# Patient Record
Sex: Female | Born: 1970 | Race: White | Hispanic: No | Marital: Married | Smoking: Never smoker
Health system: Southern US, Community
[De-identification: ages and names within clinical notes are randomized; demographics above are authoritative.]

## PROBLEM LIST (undated history)

## (undated) DIAGNOSIS — M1120 Other chondrocalcinosis, unspecified site: Secondary | ICD-10-CM

## (undated) DIAGNOSIS — K219 Gastro-esophageal reflux disease without esophagitis: Secondary | ICD-10-CM

## (undated) DIAGNOSIS — M069 Rheumatoid arthritis, unspecified: Secondary | ICD-10-CM

## (undated) DIAGNOSIS — J40 Bronchitis, not specified as acute or chronic: Secondary | ICD-10-CM

## (undated) DIAGNOSIS — E079 Disorder of thyroid, unspecified: Secondary | ICD-10-CM

## (undated) HISTORY — PX: ESSURE TUBAL LIGATION: SUR464

## (undated) HISTORY — DX: Gastro-esophageal reflux disease without esophagitis: K21.9

## (undated) HISTORY — PX: OTHER SURGICAL HISTORY: SHX169

## (undated) HISTORY — PX: ABDOMINAL SURGERY: SHX537

## (undated) HISTORY — DX: Other chondrocalcinosis, unspecified site: M11.20

## (undated) HISTORY — DX: Rheumatoid arthritis, unspecified: M06.9

## (undated) HISTORY — DX: Disorder of thyroid, unspecified: E07.9

---

## 2009-03-31 ENCOUNTER — Ambulatory Visit: Payer: Self-pay | Admitting: Family Medicine

## 2009-03-31 DIAGNOSIS — E039 Hypothyroidism, unspecified: Secondary | ICD-10-CM | POA: Insufficient documentation

## 2009-03-31 DIAGNOSIS — M25569 Pain in unspecified knee: Secondary | ICD-10-CM | POA: Insufficient documentation

## 2009-04-30 ENCOUNTER — Encounter: Payer: Self-pay | Admitting: Family Medicine

## 2009-05-26 ENCOUNTER — Ambulatory Visit: Payer: Self-pay | Admitting: Family Medicine

## 2009-05-26 DIAGNOSIS — M069 Rheumatoid arthritis, unspecified: Secondary | ICD-10-CM | POA: Insufficient documentation

## 2009-05-26 DIAGNOSIS — R7989 Other specified abnormal findings of blood chemistry: Secondary | ICD-10-CM | POA: Insufficient documentation

## 2009-05-26 DIAGNOSIS — M059 Rheumatoid arthritis with rheumatoid factor, unspecified: Secondary | ICD-10-CM | POA: Insufficient documentation

## 2009-05-26 DIAGNOSIS — R030 Elevated blood-pressure reading, without diagnosis of hypertension: Secondary | ICD-10-CM | POA: Insufficient documentation

## 2009-05-28 ENCOUNTER — Telehealth: Payer: Self-pay | Admitting: Family Medicine

## 2009-06-06 ENCOUNTER — Telehealth: Payer: Self-pay | Admitting: Family Medicine

## 2009-06-16 ENCOUNTER — Ambulatory Visit: Payer: Self-pay | Admitting: Family Medicine

## 2009-06-16 LAB — CONVERTED CEMR LAB: Blood Glucose, AC Bkfst: 88 mg/dL

## 2009-07-10 ENCOUNTER — Telehealth: Payer: Self-pay | Admitting: Family Medicine

## 2009-10-21 ENCOUNTER — Ambulatory Visit: Payer: Self-pay | Admitting: Family Medicine

## 2009-10-21 ENCOUNTER — Encounter (INDEPENDENT_AMBULATORY_CARE_PROVIDER_SITE_OTHER): Payer: Self-pay | Admitting: *Deleted

## 2009-10-21 ENCOUNTER — Encounter: Admission: RE | Admit: 2009-10-21 | Discharge: 2009-10-21 | Payer: Self-pay | Admitting: Family Medicine

## 2009-10-22 ENCOUNTER — Encounter: Payer: Self-pay | Admitting: Family Medicine

## 2009-10-22 LAB — CONVERTED CEMR LAB: TSH: 1.105 microintl units/mL (ref 0.350–4.500)

## 2009-10-29 ENCOUNTER — Encounter: Payer: Self-pay | Admitting: Family Medicine

## 2009-12-24 ENCOUNTER — Encounter: Payer: Self-pay | Admitting: Family Medicine

## 2010-02-18 ENCOUNTER — Ambulatory Visit: Payer: Self-pay | Admitting: Family Medicine

## 2010-03-19 ENCOUNTER — Encounter: Payer: Self-pay | Admitting: Family Medicine

## 2010-04-23 ENCOUNTER — Encounter: Payer: Self-pay | Admitting: Family Medicine

## 2010-06-26 ENCOUNTER — Ambulatory Visit: Payer: Self-pay | Admitting: Family Medicine

## 2010-06-26 DIAGNOSIS — F330 Major depressive disorder, recurrent, mild: Secondary | ICD-10-CM | POA: Insufficient documentation

## 2010-06-26 DIAGNOSIS — F329 Major depressive disorder, single episode, unspecified: Secondary | ICD-10-CM

## 2010-06-26 DIAGNOSIS — F32A Depression, unspecified: Secondary | ICD-10-CM | POA: Insufficient documentation

## 2010-08-24 ENCOUNTER — Encounter: Payer: Self-pay | Admitting: Family Medicine

## 2010-08-25 ENCOUNTER — Ambulatory Visit: Payer: Self-pay | Admitting: Family Medicine

## 2010-08-25 DIAGNOSIS — J069 Acute upper respiratory infection, unspecified: Secondary | ICD-10-CM | POA: Insufficient documentation

## 2010-09-02 ENCOUNTER — Ambulatory Visit: Payer: Self-pay | Admitting: Family Medicine

## 2010-09-02 ENCOUNTER — Encounter
Admission: RE | Admit: 2010-09-02 | Discharge: 2010-09-02 | Payer: Self-pay | Source: Home / Self Care | Admitting: Family Medicine

## 2010-09-02 DIAGNOSIS — R05 Cough: Secondary | ICD-10-CM

## 2010-09-02 DIAGNOSIS — J189 Pneumonia, unspecified organism: Secondary | ICD-10-CM | POA: Insufficient documentation

## 2010-09-02 DIAGNOSIS — R059 Cough, unspecified: Secondary | ICD-10-CM | POA: Insufficient documentation

## 2010-09-02 DIAGNOSIS — R0902 Hypoxemia: Secondary | ICD-10-CM | POA: Insufficient documentation

## 2010-09-02 DIAGNOSIS — R062 Wheezing: Secondary | ICD-10-CM | POA: Insufficient documentation

## 2010-09-04 ENCOUNTER — Ambulatory Visit: Payer: Self-pay | Admitting: Family Medicine

## 2010-09-07 ENCOUNTER — Ambulatory Visit: Payer: Self-pay | Admitting: Family Medicine

## 2010-09-08 ENCOUNTER — Ambulatory Visit: Payer: Self-pay | Admitting: Family Medicine

## 2010-09-16 ENCOUNTER — Telehealth: Payer: Self-pay | Admitting: Family Medicine

## 2010-09-22 ENCOUNTER — Encounter: Payer: Self-pay | Admitting: Family Medicine

## 2010-10-08 ENCOUNTER — Ambulatory Visit
Admission: RE | Admit: 2010-10-08 | Discharge: 2010-10-08 | Payer: Self-pay | Source: Home / Self Care | Attending: Family Medicine | Admitting: Family Medicine

## 2010-10-08 DIAGNOSIS — J309 Allergic rhinitis, unspecified: Secondary | ICD-10-CM | POA: Insufficient documentation

## 2010-10-13 ENCOUNTER — Encounter: Payer: Self-pay | Admitting: Family Medicine

## 2010-10-14 LAB — CONVERTED CEMR LAB
ALT: 16 units/L (ref 0–35)
AST: 15 units/L (ref 0–37)
Albumin: 4.3 g/dL (ref 3.5–5.2)
Calcium: 9.1 mg/dL (ref 8.4–10.5)
Cholesterol: 188 mg/dL (ref 0–200)
Creatinine, Ser: 0.76 mg/dL (ref 0.40–1.20)
HCT: 43.8 % (ref 36.0–46.0)
LDL Cholesterol: 129 mg/dL — ABNORMAL HIGH (ref 0–99)
MCHC: 32.4 g/dL (ref 30.0–36.0)
MCV: 96.7 fL (ref 78.0–100.0)
RDW: 13.5 % (ref 11.5–15.5)
Sodium: 141 meq/L (ref 135–145)
Total Bilirubin: 0.6 mg/dL (ref 0.3–1.2)
Total CHOL/HDL Ratio: 4.5
Total Protein: 7 g/dL (ref 6.0–8.3)

## 2010-10-27 ENCOUNTER — Telehealth: Payer: Self-pay | Admitting: Family Medicine

## 2010-10-27 NOTE — Letter (Signed)
Summary: Depression Questionnaire  Depression Questionnaire   Imported By: Lanelle Bal 07/10/2010 10:37:19  _____________________________________________________________________  External Attachment:    Type:   Image     Comment:   External Document

## 2010-10-27 NOTE — Letter (Signed)
Summary: [facility]   Imported By: Maryln Gottron 09/04/2010 12:12:24  _____________________________________________________________________  External Attachment:    Type:   Image     Comment:   External Document

## 2010-10-27 NOTE — Letter (Signed)
Summary: Patient Health Questionnaire  Patient Health Questionnaire   Imported By: Maryln Gottron 09/04/2010 13:51:16  _____________________________________________________________________  External Attachment:    Type:   Image     Comment:   External Document

## 2010-10-27 NOTE — Letter (Signed)
Summary: [facility]   Imported By: Lanelle Bal 05/12/2010 14:08:20  _____________________________________________________________________  External Attachment:    Type:   Image     Comment:   External Document

## 2010-10-27 NOTE — Assessment & Plan Note (Signed)
Summary: depression   Vital Signs:  Patient profile:   40 year old female Height:      66 inches Weight:      197 pounds BMI:     31.91 O2 Sat:      98 % on Room air Pulse rate:   85 / minute BP sitting:   129 / 83  (right arm) Cuff size:   regular  Vitals Entered By: Payton Spark CMA (June 26, 2010 11:26 AM)  O2 Flow:  Room air CC: Depression x few months. Family has noticed changes in mood.   Primary Care Leverne Amrhein:  Seymour Bars DO  CC:  Depression x few months. Family has noticed changes in mood.Nancy Wang  History of Present Illness: 40 yo WF presents for depression.  This is new.  Her family confronted her about her mood.  She has been through a lot after the birth of her son 52 mos ago with dx RA pain and limitations.  She feels like her pain has improved some.    She has not needed to take anything for mood in the past.  Denies any suicidal thoughts.  Has poor sleep at night and is always tired and irritable.    Current Medications (verified): 1)  Levothyroxine Sodium 100 Mcg Tabs (Levothyroxine Sodium) .Nancy Wang.. 1 Tab By Mouth Daily 2)  Omeprazole 20 Mg Cpdr (Omeprazole) .Nancy Wang.. 1 Tab By Mouth Once Daily 3)  Ortho Tri-Cyclen Lo 0.18/0.215/0.25 Mg-25 Mcg Tabs (Norgestim-Eth Estrad Triphasic) .Nancy Wang.. 1 Tab By Mouth Once Daily 4)  Hydrocodone-Acetaminophen 5-500 Mg Tabs (Hydrocodone-Acetaminophen) .... As Directed 5)  Metrogel 1 % Gel (Metronidazole) .... Use As Directed By Derm 6)  Folic Acid 800 Mcg Tabs (Folic Acid) .... Daily 7)  Methotrexate 0.7mg  Inj .... Use As Directed Once Weekly  Allergies (verified): 1)  ! Morphine Sulfate (Morphine Sulfate)  Past History:  Past Medical History: Reviewed history from 02/18/2010 and no changes required. Hypothyroidism GERD G2P1011-- [facility] Care Pseudogout/ RA - Dr Dareen Piano  Past Surgical History: Exploratory laparoscopy 1985, 2004 for ovarian cysts, infertility Arthroscopic surgery both knees R 1985 (removed patella), L on  1988 Essure done   Family History: Mother HTN DM Father deceased CAD Prostate CA, ? depression Brother healthy Family History Diabetes 1st degree relative Family History Hypertension  Social History: Reviewed history from 05/26/2009 and no changes required. Married to [employer] Never Smoked Alcohol use-no  Drug use-no Regular exercise-no, limited by knee pain. 89 pre- pregnancy wt. Has a 65 mos old son  Review of Systems Psych:  Complains of depression, easily tearful, and irritability; denies anxiety, easily angered, panic attacks, suicidal thoughts/plans, thoughts of violence, and unusual visions or sounds.  Physical Exam  General:  alert, well-developed, well-nourished, well-hydrated, and overweight-appearing.   Head:  normocephalic and atraumatic.   Neck:  no masses.   Lungs:  Normal respiratory effort, chest expands symmetrically. Lungs are clear to auscultation, no crackles or wheezes. Heart:  normal rate, regular rhythm, and no murmur.   Skin:  color normal.   Psych:  good eye contact and depressed affect.     Impression & Recommendations:  Problem # 1:  DEPRESSION (ICD-311) Assessment New PHQ-9 score of 21.  Likely secondary to failry new dx of RA, chronic pain and limitations. She is on Essure for birth control.  Will start her on Citalopram 10 mg/ day and RTC in 6 wks to repeat PHQ-9.  Has a good support system.  Consider adding counseling.  Call if any  problems including suicidal ideations/ worsening mood.     Her updated medication list for this problem includes:    Citalopram Hydrobromide 10 Mg Tabs (Citalopram hydrobromide) .Nancy Wang... 1 tab by mouth qpm  Complete Medication List: 1)  Levothyroxine Sodium 100 Mcg Tabs (Levothyroxine sodium) .Nancy Wang.. 1 tab by mouth daily 2)  Omeprazole 20 Mg Cpdr (Omeprazole) .Nancy Wang.. 1 tab by mouth once daily 3)  Ortho Tri-cyclen Lo 0.18/0.215/0.25 Mg-25 Mcg Tabs (Norgestim-eth estrad triphasic) .Nancy Wang.. 1 tab by mouth once daily 4)   Hydrocodone-acetaminophen 5-500 Mg Tabs (Hydrocodone-acetaminophen) .... As directed 5)  Metrogel 1 % Gel (Metronidazole) .... Use as directed by derm 6)  Folic Acid 800 Mcg Tabs (Folic acid) .... Daily 7)  Methotrexate 0.7mg  Inj  .... Use as directed once weekly 8)  Citalopram Hydrobromide 10 Mg Tabs (Citalopram hydrobromide) .Nancy Wang.. 1 tab by mouth qpm  Patient Instructions: 1)  Start on Citalopram 10 mg in the evenings for depression. 2)  Call if any problems. 3)  Return for f/u in 6 wks.   Prescriptions: CITALOPRAM HYDROBROMIDE 10 MG TABS (CITALOPRAM HYDROBROMIDE) 1 tab by mouth qPM  #30 x 1   Entered and Authorized by:   Seymour Bars DO   Signed by:   Seymour Bars DO on 06/26/2010   Method used:   Electronically to        [phone]* (retail)       [address]       [place], [place]  [address]       Ph: [phone]       Fax: [phone]   RxID:   [id number]

## 2010-10-27 NOTE — Assessment & Plan Note (Signed)
Summary: Check O2//mpm  Nurse Visit   Vital Signs:  Patient profile:   40 year old female O2 Sat:      96 % on Room air Temp:     98.2 degrees F oral  Vitals Entered By: Payton Spark CMA (September 04, 2010 11:28 AM)  O2 Flow:  Room air CC: Pt states she will continue current medications before going to ED. I strongly advised Pt to go to ED if SOB becomes worse. Pt agreed. Pt will also call on Monday to report progress.    Allergies: 1)  ! Morphine Sulfate (Morphine Sulfate)  Orders Added: 1)  Est. Patient Level I [38756]    Impression & Recommendations:  Problem # 1:  PNEUMONIA, ATYPICAL (ICD-486) Pt is already on treatment for atypical PNA but her CXR was normal.  She is abx and steroids with RX cough syrup.  Denies SOB.  Still has cough and congestion but meds are helping.  Pulse ox improved to 96%.  Pt was instructed that if breathing got worse, to go to the ED and she agrees.  Continue current treatment plan.   Her updated medication list for this problem includes:    Zithromax Z-pak 250 Mg Tabs (Azithromycin) .Marland Kitchen... Take as directed  Complete Medication List: 1)  Levothyroxine Sodium 100 Mcg Tabs (Levothyroxine sodium) .Marland Kitchen.. 1 tab by mouth daily 2)  Omeprazole 20 Mg Cpdr (Omeprazole) .Marland Kitchen.. 1 tab by mouth once daily 3)  Ortho Tri-cyclen Lo 0.18/0.215/0.25 Mg-25 Mcg Tabs (Norgestim-eth estrad triphasic) .Marland Kitchen.. 1 tab by mouth once daily 4)  Hydrocodone-acetaminophen 5-500 Mg Tabs (Hydrocodone-acetaminophen) .... As directed 5)  Metrogel 1 % Gel (Metronidazole) .... Use as directed by derm 6)  Folic Acid 800 Mcg Tabs (Folic acid) .... Daily 7)  Methotrexate 1 Mg  .... Use as directed once weekly 8)  Citalopram Hydrobromide 20 Mg Tabs (Citalopram hydrobromide) .Marland Kitchen.. 1 tab by mouth daily 9)  Promethazine-codeine 6.25-10 Mg/74ml Syrp (Promethazine-codeine) .... 5-10 ml by mouth q 6 hrs as needed cough 10)  Zithromax Z-pak 250 Mg Tabs (Azithromycin) .... Take as directed

## 2010-10-27 NOTE — Assessment & Plan Note (Signed)
Summary: URI/ mood   Vital Signs:  Patient profile:   40 year old female Height:      66 inches Weight:      189 pounds BMI:     30.62 O2 Sat:      96 % on Room air Temp:     99.0 degrees F oral Pulse rate:   104 / minute BP sitting:   122 / 79  (left arm) Cuff size:   regular  Vitals Entered By: Payton Spark CMA (August 25, 2010 11:15 AM)  O2 Flow:  Room air CC: f/u mood and also c/o cough.   Primary Care Provider:  Seymour Bars DO  CC:  f/u mood and also c/o cough..  History of Present Illness: 40 yo WF presents for f/u depression.  She started on Citalopram 8 wks ago.  She has noticed some large improvements.    Started with sore throat and runny nose with low grade fevers 5 days ago followed by a dry hacking cough..  She took some Nyquil which helped some.  Her son has had a cold.  She is not taking anything during the day.  Denies sputum production, CP or SOB.  No hx of asthma.  Non smoker.  On MTX for RA.    Current Medications (verified): 1)  Levothyroxine Sodium 100 Mcg Tabs (Levothyroxine Sodium) .Marland Kitchen.. 1 Tab By Mouth Daily 2)  Omeprazole 20 Mg Cpdr (Omeprazole) .Marland Kitchen.. 1 Tab By Mouth Once Daily 3)  Ortho Tri-Cyclen Lo 0.18/0.215/0.25 Mg-25 Mcg Tabs (Norgestim-Eth Estrad Triphasic) .Marland Kitchen.. 1 Tab By Mouth Once Daily 4)  Hydrocodone-Acetaminophen 5-500 Mg Tabs (Hydrocodone-Acetaminophen) .... As Directed 5)  Metrogel 1 % Gel (Metronidazole) .... Use As Directed By Derm 6)  Folic Acid 800 Mcg Tabs (Folic Acid) .... Daily 7)  Methotrexate 0.7mg  Inj .... Use As Directed Once Weekly 8)  Citalopram Hydrobromide 10 Mg Tabs (Citalopram Hydrobromide) .Marland Kitchen.. 1 Tab By Mouth Qpm  Allergies (verified): 1)  ! Morphine Sulfate (Morphine Sulfate)  Past History:  Past Medical History: Reviewed history from 02/18/2010 and no changes required. Hypothyroidism GERD G2P1011-- [facility] Care Pseudogout/ RA - Dr Dareen Piano  Social History: Reviewed history from 05/26/2009 and no changes  required. Married to [employer] Never Smoked Alcohol use-no  Drug use-no Regular exercise-no, limited by knee pain. 89 pre- pregnancy wt. Has toddler son  Review of Systems      See HPI  Physical Exam  General:  alert, well-developed, well-nourished, well-hydrated, and overweight-appearing.   Head:  normocephalic and atraumatic.  sinuses NTTP Eyes:  conjunctiva clear Ears:  EACs patent; TMs translucent and gray with good cone of light and bony landmarks.  Nose:  copious nasal congestion with clear rhinorrhea Mouth:  o/p mildly injected; no exudates or vesicles Neck:  no masses.   Lungs:  Normal respiratory effort, chest expands symmetrically. Lungs are clear to auscultation, no crackles or wheezes. dry cough Heart:  Normal rate and regular rhythm. S1 and S2 normal without gallop, murmur, click, rub or other extra sounds. Skin:  color normal and no rashes.   Cervical Nodes:  shotty anterior cervical chain LA   Impression & Recommendations:  Problem # 1:  VIRAL URI (ICD-465.9) Day 5 viral URI.  Will treat with supportive care measures as listed under pt instructions.  Call if not improved after 7 days.  Problem # 2:  DEPRESSION (ICD-311) PHQ 9 score 7, much improved.  Will increase her dose from 10--> 20 mg once daily.  Call if  any problems.  REturn for f/u in 3 mos. Her updated medication list for this problem includes:    Citalopram Hydrobromide 20 Mg Tabs (Citalopram hydrobromide) .Marland Kitchen... 1 tab by mouth daily  Complete Medication List: 1)  Levothyroxine Sodium 100 Mcg Tabs (Levothyroxine sodium) .Marland Kitchen.. 1 tab by mouth daily 2)  Omeprazole 20 Mg Cpdr (Omeprazole) .Marland Kitchen.. 1 tab by mouth once daily 3)  Ortho Tri-cyclen Lo 0.18/0.215/0.25 Mg-25 Mcg Tabs (Norgestim-eth estrad triphasic) .Marland Kitchen.. 1 tab by mouth once daily 4)  Hydrocodone-acetaminophen 5-500 Mg Tabs (Hydrocodone-acetaminophen) .... As directed 5)  Metrogel 1 % Gel (Metronidazole) .... Use as directed by derm 6)  Folic Acid 800  Mcg Tabs (Folic acid) .... Daily 7)  Methotrexate 1 Mg  .... Use as directed once weekly 8)  Citalopram Hydrobromide 20 Mg Tabs (Citalopram hydrobromide) .Marland Kitchen.. 1 tab by mouth daily  Patient Instructions: 1)  Increase Citalopram to 20 mg once day. 2)  Use Advil Cold and Flu during the day and Nyquil at night as needed this week.  REst, clear fluids and call if not improved or getting worse next wk. 3)  If OK, come in for flu shot next wk.   Prescriptions: CITALOPRAM HYDROBROMIDE 20 MG TABS (CITALOPRAM HYDROBROMIDE) 1 tab by mouth daily  #30 x 3   Entered and Authorized by:   Seymour Bars DO   Signed by:   Seymour Bars DO on 08/25/2010   Method used:   Electronically to        [facility] [phone]* (retail)       [address] [place], [place]  [address]       Ph: [phone]       Fax: [phone]   RxID:   [id number]    Orders Added: 1)  Est. Patient Level III [36644]

## 2010-10-27 NOTE — Assessment & Plan Note (Signed)
Summary: f/u RA/ thyroid   Vital Signs:  Patient profile:   40 year old female Height:      66 inches Weight:      200 pounds BMI:     32.40 O2 Sat:      98 % on Room air Temp:     99.9 degrees F oral Pulse rate:   108 / minute BP sitting:   127 / 75  (left arm) Cuff size:   large  Vitals Entered By: Payton Spark CMA (October 21, 2009 9:59 AM)  O2 Flow:  Room air CC: Discuss RA. Throat sore to touch and sometimes hard to swallow. x 2 months.    Primary Care Provider:  Seymour Bars DO  CC:  Discuss RA. Throat sore to touch and sometimes hard to swallow. x 2 months. .  History of Present Illness: 40 yo WF presents for a recurring sensation of throat tendenerss from the outside but no pain with swallowing.  Denies feeling like she cannot breathe.  She is able to swallow food and drink.  Denies having dry mouth.   Denies having heartburn symptoms.  Denies voice hoarseness , neck swelling or new meds.    She has been on Prednisone for her RA since July and has had wt gain.  She has been out of work due to her RA.  She is on MTX daily.  She is interested in a 2nd opinion in her RA treatment.      Current Medications (verified): 1)  Pre-Natal  Tabs (Prenatal Multivit-Min-Fe-Fa) 2)  Levothyroxine Sodium 100 Mcg Tabs (Levothyroxine Sodium) .Marland Kitchen.. 1 Tab By Mouth Daily 3)  Omeprazole 20 Mg Cpdr (Omeprazole) .Marland Kitchen.. 1 Tab By Mouth Once Daily 4)  Ortho Tri-Cyclen Lo 0.18/0.215/0.25 Mg-25 Mcg Tabs (Norgestim-Eth Estrad Triphasic) .Marland Kitchen.. 1 Tab By Mouth Once Daily 5)  Hydrocodone-Acetaminophen 5-500 Mg Tabs (Hydrocodone-Acetaminophen) .... As Directed 6)  Methotrexate .... Take 7 Tabs Every Wed As Directed  Allergies (verified): 1)  ! Morphine Sulfate (Morphine Sulfate)  Past History:  Past Medical History: Reviewed history from 05/26/2009 and no changes required. Hypothyroidism GERD G2P1011-- [facility] Care Pseudogout/ RA - Dr Virgel Manifold.  Social History: Reviewed history from  05/26/2009 and no changes required. Married to [employer] Never Smoked Alcohol use-no  Drug use-no Regular exercise-no, limited by knee pain. 89 pre- pregnancy wt. Has a 38 mos old son  Review of Systems      See HPI  Physical Exam  General:  obese WF in NAD Head:  normocephalic and atraumatic.   Eyes:  conjunctiva clear Ears:  no external deformities.   Nose:  no nasal discharge.   Mouth:  pharynx pink and moist.  swallows w/o difficulty Neck:  supple and no masses.  no anterior neck tenderness.  no thyromegaly, thyroid bruits or masses palpable. Lungs:  Normal respiratory effort, chest expands symmetrically. Lungs are clear to auscultation, no crackles or wheezes. Heart:  Normal rate and regular rhythm. S1 and S2 normal without gallop, murmur, click, rub or other extra sounds. Extremities:  1+ nonpitting LE edema bilat Skin:  color normal.   Cervical Nodes:  No lymphadenopathy noted Psych:  flat affect.     Impression & Recommendations:  Problem # 1:  HYPOTHYROIDISM (ICD-244.9) Due for TSH today and will add a thyroid u/s for new onset anterior neck tenderness.  Labs should indicate any kind of thyroiditis which would need a nuclear test.  Also in the DDX fo anterior neck pain is acid reflux  or angioedema.   Her updated medication list for this problem includes:    Levothyroxine Sodium 100 Mcg Tabs (Levothyroxine sodium) .Marland Kitchen... 1 tab by mouth daily  Orders: T-*Unlisted Diagnostic X-ray test/procedure (04540) T-TSH [phone])  Problem # 2:  RHEUMATOID ARTHRITIS (ICD-714.0) She has been seeing Dr Virgel Manifold for the past year, on MTX but still having daily disabling symptoms.  Will get her in with Dr Dareen Piano in [facility] since she wants to see if something more aggressive can be done.   The following medications were removed from the medication list:    Prednisone 10 Mg Tabs (Prednisone) .Marland Kitchen... 1 tab by mouth once daily  Orders: Rheumatology Referral (Rheumatology)  Complete  Medication List: 1)  Pre-natal Tabs (Prenatal multivit-min-fe-fa) 2)  Levothyroxine Sodium 100 Mcg Tabs (Levothyroxine sodium) .Marland Kitchen.. 1 tab by mouth daily 3)  Omeprazole 20 Mg Cpdr (Omeprazole) .Marland Kitchen.. 1 tab by mouth once daily 4)  Ortho Tri-cyclen Lo 0.18/0.215/0.25 Mg-25 Mcg Tabs (Norgestim-eth estrad triphasic) .Marland Kitchen.. 1 tab by mouth once daily 5)  Hydrocodone-acetaminophen 5-500 Mg Tabs (Hydrocodone-acetaminophen) .... As directed 6)  Methotrexate 2. 5mg   .... Take 7 tabs every wed as directed 7)  Folic Acid 800 Mcg Tabs (Folic acid) .... Daily  Other Orders: T-LDL Direct ([phone])  Patient Instructions: 1)  Labs today. 2)  Thyroid u/s today. 3)  Will call you w/ results tomorrow. 4)  REferral placed to Dr Dareen Piano in [facility] for RA treatment. 5)  Return for f/u in 4 mos.

## 2010-10-27 NOTE — Letter (Signed)
Summary: Primary Care Consult Scheduled Letter  [place] [facility]  [address]   [place], [place] [address]   Phone: [phone]  Fax: [phone]      10/21/2009 MRN: [id number]  [facility] [address] [place], [place]  [address]    Dear Ms. [facility],      We have scheduled an appointment for you.  At the recommendation of DR Cathey Endow, we have scheduled you a consult with [facility] ,DR Dareen Piano ,RHEUMATOLOGIST  on [facility] 2,2011 at 10AM.  Their address is [address],[place]. The office phone number is [phone].  If this appointment day and time is not convenient for you, please feel free to call the office of the doctor you are being referred to at the number listed above and reschedule the appointment.     It is important for you to keep your scheduled appointments. We are here to make sure you are given good patient care. If you have questions or you have made changes to your appointment, please notify us at  [phone], ask for HELEN.    Thank you,  Patient Care Coordinator [place] [facility]

## 2010-10-27 NOTE — Assessment & Plan Note (Signed)
Summary: PNA   Vital Signs:  Patient profile:   40 year old female Height:      66 inches Weight:      183 pounds BMI:     29.64 O2 Sat:      93 % on Room air Temp:     98.7 degrees F oral Pulse rate:   96 / minute BP sitting:   116 / 75  (left arm) Cuff size:   large  Vitals Entered By: Payton Spark CMA (September 02, 2010 9:29 AM)  O2 Flow:  Room air CC: Cough much worse and back pain due to cough x weeks.    Primary Care Provider:  Seymour Bars DO  CC:  Cough much worse and back pain due to cough x weeks. Marland Kitchen  History of Present Illness: 40 yo WF presents for continued cough after she was seen for a URI about 10 days ago.  She is coughing to the point of post tussive emesis.  She has back pain from coughing.  Using Advil Cold daytime and nyquil at night.  Hx of asthma as a child.  Denies fevers but has chills and bodyaches.  she is on MTX and did not hold her dose last wk.  Was due for injection today.  She has chest tightness and DOE wtih malaise.  Denies abd pain or nausea.    Allergies: 1)  ! Morphine Sulfate (Morphine Sulfate)  Past History:  Past Medical History: Reviewed history from 02/18/2010 and no changes required. Hypothyroidism GERD G2P1011-- [facility] Care Pseudogout/ RA - Dr Dareen Piano  Past Surgical History: Reviewed history from 06/26/2010 and no changes required. Exploratory laparoscopy 1985, 2004 for ovarian cysts, infertility Arthroscopic surgery both knees R 1985 (removed patella), L on 1988 Essure done   Social History: Reviewed history from 08/25/2010 and no changes required. Married to [employer] Never Smoked Alcohol use-no  Drug use-no Regular exercise-no, limited by knee pain. 89 pre- pregnancy wt. Has toddler son  Review of Systems      See HPI  Physical Exam  General:  alert, well-developed, well-nourished, well-hydrated, and overweight-appearing.   Head:  normocephalic and atraumatic.  sinuses NTTP Eyes:  conjunctiva  clear Ears:  EACs patent; TMs translucent and gray with good cone of light and bony landmarks.  Nose:  no nasal discharge.   Mouth:  pharynx pink and moist.  o/p mildly injected Neck:  shotty anterior cervical chain LA Chest Wall:  no tenderness.   Lungs:  nonlabored breathing with continue dry hacking cough, no rhonchi but has scattered wheezing  Heart:  normal rate, regular rhythm, and no murmur.   Extremities:  no LE edema Skin:  color normal.  skin warm and dry   Impression & Recommendations:  Problem # 1:  PNEUMONIA, ATYPICAL (ICD-486)  Will treat for atypical PNA given worsening cough, hypoxemia, wheezing and SOB with malaise >10 days after onset URI with use of immunosuppressant meds.  CXR today given hypoxemia. Rocephin 1 gram IM + Zithromax x 5 days given.  Use RX cough syrup as needed.  Treat wheezing with Depo Medrol 80 mg IM x 1 + ProAir HFA 2 puffs 4 x a day.  REcheck in 6 days.  Advised if feeling worse with SOB, needed to go to the ED/ call 911.  Pt agrees to plan of care. Her updated medication list for this problem includes:    Zithromax Z-pak 250 Mg Tabs (Azithromycin) .Marland Kitchen... Take as directed  Orders: Rocephin  250mg  (  V7846) Admin of Therapeutic Inj  intramuscular or subcutaneous (96295)  Complete Medication List: 1)  Levothyroxine Sodium 100 Mcg Tabs (Levothyroxine sodium) .Marland Kitchen.. 1 tab by mouth daily 2)  Omeprazole 20 Mg Cpdr (Omeprazole) .Marland Kitchen.. 1 tab by mouth once daily 3)  Ortho Tri-cyclen Lo 0.18/0.215/0.25 Mg-25 Mcg Tabs (Norgestim-eth estrad triphasic) .Marland Kitchen.. 1 tab by mouth once daily 4)  Hydrocodone-acetaminophen 5-500 Mg Tabs (Hydrocodone-acetaminophen) .... As directed 5)  Metrogel 1 % Gel (Metronidazole) .... Use as directed by derm 6)  Folic Acid 800 Mcg Tabs (Folic acid) .... Daily 7)  Methotrexate 1 Mg  .... Use as directed once weekly 8)  Citalopram Hydrobromide 20 Mg Tabs (Citalopram hydrobromide) .Marland Kitchen.. 1 tab by mouth daily 9)  Promethazine-codeine 6.25-10  Mg/76ml Syrp (Promethazine-codeine) .... 5-10 ml by mouth q 6 hrs as needed cough 10)  Zithromax Z-pak 250 Mg Tabs (Azithromycin) .... Take as directed  Other Orders: T-Chest x-ray, 2 views (71020) Depo- Medrol 80mg  (J1040)  Patient Instructions: 1)  CXR today. 2)  Will call you w/ results this afternoon. 3)  Depo Medrol injection + Rocephin injections given today. 4)  Take 5 days of Zithromax. 5)  Use inhaler 2 puffs 4 x a day for the next week. 6)  Use RX cough syrup for cough (it is sedating) and use Advil for aches and pains. 7)  Return for follow up next Tuesday. Prescriptions: ZITHROMAX Z-PAK 250 MG TABS (AZITHROMYCIN) take as directed  #1 pack x 0   Entered and Authorized by:   Seymour Bars DO   Signed by:   Seymour Bars DO on 09/02/2010   Method used:   Electronically to        [phone]* (retail)       [address] [place], [place]  [address]       Ph: [phone]       Fax: [phone]   RxID:   [id number] PROMETHAZINE-CODEINE 6.25-10 MG/5ML SYRP (PROMETHAZINE-CODEINE) 5-10 ml by mouth q 6 hrs as needed cough  #200 ml x 0   Entered and Authorized by:   Seymour Bars DO   Signed by:   Seymour Bars DO on 09/02/2010   Method used:   Printed then faxed to ...       [facility] [phone]* (retail)       [address] [place], [place]  [address]       Ph: [phone]       Fax: [phone]   RxID:   [id number]    Medication Administration  Injection # 1:    Medication: Depo- Medrol 80mg     Diagnosis: HYPOXEMIA (ICD-799.02)    Route: IM    Site: RUOQ gluteus    Exp Date: 03/2011    Lot #: [id number]    Patient tolerated injection without complications    Given by: Payton Spark CMA (September 02, 2010 10:35 AM)  Injection # 2:    Medication: Rocephin  250mg     Diagnosis: HYPOXEMIA (ICD-799.02)    Route: IM    Site: LUOQ gluteus    Exp Date: 01/2013    Lot #: [id number]    Patient tolerated injection without complications     Given by: Payton Spark CMA (September 02, 2010 10:36 AM)  Orders Added: 1)  T-Chest x-ray, 2 views [71020] 2)  Depo- Medrol 80mg  [J1040] 3)  Rocephin  250mg  [J0696] 4)  Admin of Therapeutic Inj  intramuscular or subcutaneous [96372] 5)  Est. Patient Level III [56387]       Medication Administration  Injection # 1:    Medication: Depo- Medrol 80mg     Diagnosis: HYPOXEMIA (ICD-799.02)    Route: IM    Site: RUOQ gluteus    Exp Date: 03/2011    Lot #: [id number]    Patient tolerated injection without complications    Given by: Payton Spark CMA (September 02, 2010 10:35 AM)  Injection # 2:    Medication: Rocephin  250mg     Diagnosis: HYPOXEMIA (ICD-799.02)    Route: IM    Site: LUOQ gluteus    Exp Date: 01/2013    Lot #: [id number]    Patient tolerated injection without complications    Given by: Payton Spark CMA (September 02, 2010 10:36 AM)  Orders Added: 1)  T-Chest x-ray, 2 views [71020] 2)  Depo- Medrol 80mg  [J1040] 3)  Rocephin  250mg  [J0696] 4)  Admin of Therapeutic Inj  intramuscular or subcutaneous [96372] 5)  Est. Patient Level III [95188]   Appended Document: PNA append: instructed pt to NOT TAKE her Methotrexate today.  Will reasses before next wk's scheduled injection.  Seymour Bars, D.O.

## 2010-10-27 NOTE — Consult Note (Signed)
Summary: [facility]   Imported By: Lanelle Bal 04/01/2010 09:49:37  _____________________________________________________________________  External Attachment:    Type:   Image     Comment:   External Document

## 2010-10-27 NOTE — Consult Note (Signed)
Summary: [facility]   Imported By: Lanelle Bal 11/07/2009 11:30:13  _____________________________________________________________________  External Attachment:    Type:   Image     Comment:   External Document

## 2010-10-27 NOTE — Assessment & Plan Note (Signed)
Summary: f/u RA/ thyroid   Vital Signs:  Patient profile:   40 year old female Height:      66 inches Weight:      196 pounds Pulse rate:   85 / minute BP sitting:   110 / 65  (left arm) Cuff size:   regular  Vitals Entered By: Kathlene November (Feb 18, 2010 9:58 AM) CC: followup   Primary Care Provider:  Seymour Bars DO  CC:  followup.  History of Present Illness: 40 yo WF presents for f/u visit.  Her TSH and thyroid u/s were normal 4 mos ago.  She is now seeing Dr Dareen Piano is managing her RA and pseudogout.  She is on MTX and had an injection in her L ankle which really helped.  She is still having aches and pains in her joints.  She has not been needing her cane.  She is not taking any NSAIDs and saves her Vicodin for severe pain.   she has improvement in her function since having corticosteroid ankle injection.    Current Medications (verified): 1)  Levothyroxine Sodium 100 Mcg Tabs (Levothyroxine Sodium) .Marland Kitchen.. 1 Tab By Mouth Daily 2)  Omeprazole 20 Mg Cpdr (Omeprazole) .Marland Kitchen.. 1 Tab By Mouth Once Daily 3)  Ortho Tri-Cyclen Lo 0.18/0.215/0.25 Mg-25 Mcg Tabs (Norgestim-Eth Estrad Triphasic) .Marland Kitchen.. 1 Tab By Mouth Once Daily 4)  Hydrocodone-Acetaminophen 5-500 Mg Tabs (Hydrocodone-Acetaminophen) .... As Directed 5)  Methotrexate 2. 5mg  .... Take 7 Tabs Every Wed As Directed 6)  Folic Acid 800 Mcg Tabs (Folic Acid) .... Daily  Allergies (verified): 1)  ! Morphine Sulfate (Morphine Sulfate)  Comments:  Nurse/Medical Assistant: The patient's medications and allergies were reviewed with the patient and were updated in the Medication and Allergy Lists. Kathlene November (Feb 18, 2010 9:59 AM)  Past History:  Past Medical History: Hypothyroidism GERD G2P1011-- [facility] Care Pseudogout/ RA - Dr Dareen Piano  Family History: Reviewed history from 03/31/2009 and no changes required. Mother HTN DM Father deceased CAD Prostate CA Brother healthy Family History Diabetes 1st degree  relative Family History Hypertension  Social History: Reviewed history from 05/26/2009 and no changes required. Married to [employer] Never Smoked Alcohol use-no  Drug use-no Regular exercise-no, limited by knee pain. 89 pre- pregnancy wt. Has a 56 mos old son  Review of Systems      See HPI  Physical Exam  General:  alert, well-developed, well-nourished, well-hydrated, and overweight-appearing.   Head:  normocephalic and atraumatic.   Mouth:  good dentition and pharynx pink and moist.   Neck:  no masses.   Lungs:  Normal respiratory effort, chest expands symmetrically. Lungs are clear to auscultation, no crackles or wheezes. Heart:  Normal rate and regular rhythm. S1 and S2 normal without gallop, murmur, click, rub or other extra sounds. Msk:  no joint swelling, no joint warmth, and no redness over joints.   Extremities:  no LE edema Skin:  fair skinned with erythematous rash on neck Cervical Nodes:  No lymphadenopathy noted Psych:  good eye contact, not anxious appearing, and not depressed appearing.     Impression & Recommendations:  Problem # 1:  RHEUMATOID ARTHRITIS (ICD-714.0) Per Dr Dareen Piano. Functional improvements after corticosteroid injection in ankle.  Not taking as needed pain meds.  Still on MTX. Has f/u with him next month.  Problem # 2:  HYPOTHYROIDISM (ICD-244.9) Recheck TSH with labs done by Dr Dareen Piano in June or July.  Doing well on current dose. Her updated medication list for this  problem includes:    Levothyroxine Sodium 100 Mcg Tabs (Levothyroxine sodium) .Marland Kitchen... 1 tab by mouth daily  Orders: T-TSH ([id number])  Problem # 3:  SCREENING FOR MALIGNANT NEOPLASM OF THE SKIN (ICD-V76.43) Fair skinned with redness over neck, possibly photosensitivity from meds.  Referral to derm made. Orders: Dermatology Referral (Derma)  Complete Medication List: 1)  Levothyroxine Sodium 100 Mcg Tabs (Levothyroxine sodium) .Marland Kitchen.. 1 tab by mouth daily 2)  Omeprazole 20  Mg Cpdr (Omeprazole) .Marland Kitchen.. 1 tab by mouth once daily 3)  Ortho Tri-cyclen Lo 0.18/0.215/0.25 Mg-25 Mcg Tabs (Norgestim-eth estrad triphasic) .Marland Kitchen.. 1 tab by mouth once daily 4)  Hydrocodone-acetaminophen 5-500 Mg Tabs (Hydrocodone-acetaminophen) .... As directed 5)  Methotrexate 2. 5mg   .... Take 7 tabs every wed as directed 6)  Folic Acid 800 Mcg Tabs (Folic acid) .... Daily  Patient Instructions: 1)  Update TSH in June or July with next set of labs. 2)  Work on [employer] and regular exercise. 3)  Referral made for Dermatology. 4)  REturn for f/u in 6 mos.

## 2010-10-27 NOTE — Letter (Signed)
Summary: [facility]   Imported By: Lanelle Bal 01/06/2010 12:01:23  _____________________________________________________________________  External Attachment:    Type:   Image     Comment:   External Document

## 2010-10-29 NOTE — Assessment & Plan Note (Signed)
Summary: HFU f/u PNA   Vital Signs:  Patient profile:   40 year old female Height:      66 inches Weight:      183 pounds BMI:     29.64 O2 Sat:      96 % on Room air Temp:     98.5 degrees F oral Pulse rate:   94 / minute BP sitting:   116 / 75  (left arm) Cuff size:   large  Vitals Entered By: Payton Spark CMA (September 07, 2010 9:55 AM)  O2 Flow:  Room air CC: F/U ED visit for pneumo. Was given another shot of rocephin, benzonatate and hydrocodone   Primary Care Kendra Woolford:  Seymour Bars DO  CC:  F/U ED visit for pneumo. Was given another shot of rocephin and benzonatate and hydrocodone.  History of Present Illness: 40 yo WF presents for f/u visit.  I saw her last wk and treated her with Rocephin, Zithromax and RX cough meds for presumed PNA with a neg CXR but low Pulse ox with recent hx of MTX use.  She went to [facility] ED on FRI night for continued hacking cough + 2 episodes of posttussive emesis.  She had a CXR repeated that showed a LUL infiltrate.  Rocephin was repeated.  She finished her Zithromax yesterday but her hacking cough persists with a wheeze.  Has ProAir HFA started here last wk, using 4 x a day.  The ED did change her cough meds to [employer].  No longer vomitting.  SOB improving.  Still has no energy and poor appetite.  Current Medications (verified): 1)  Levothyroxine Sodium 100 Mcg Tabs (Levothyroxine Sodium) .Marland Kitchen.. 1 Tab By Mouth Daily 2)  Omeprazole 20 Mg Cpdr (Omeprazole) .Marland Kitchen.. 1 Tab By Mouth Once Daily 3)  Ortho Tri-Cyclen Lo 0.18/0.215/0.25 Mg-25 Mcg Tabs (Norgestim-Eth Estrad Triphasic) .Marland Kitchen.. 1 Tab By Mouth Once Daily 4)  Hydrocodone-Acetaminophen 5-500 Mg Tabs (Hydrocodone-Acetaminophen) .... As Directed 5)  Metrogel 1 % Gel (Metronidazole) .... Use As Directed By Derm 6)  Folic Acid 800 Mcg Tabs (Folic Acid) .... Daily 7)  Methotrexate 1 Mg .... Use As Directed Once Weekly 8)  Citalopram Hydrobromide 20 Mg Tabs (Citalopram Hydrobromide) .Marland Kitchen.. 1 Tab By Mouth  Daily  Allergies (verified): 1)  ! Morphine Sulfate (Morphine Sulfate)  Past History:  Past Medical History: Reviewed history from 02/18/2010 and no changes required. Hypothyroidism GERD G2P1011-- [facility] Care Pseudogout/ RA - Dr Dareen Piano  Past Surgical History: Reviewed history from 06/26/2010 and no changes required. Exploratory laparoscopy 1985, 2004 for ovarian cysts, infertility Arthroscopic surgery both knees R 1985 (removed patella), L on 1988 Essure done   Social History: Reviewed history from 08/25/2010 and no changes required. Married to [employer] Never Smoked Alcohol use-no  Drug use-no Regular exercise-no, limited by knee pain. 89 pre- pregnancy wt. Has toddler son  Review of Systems      See HPI  Physical Exam  General:  alert, well-developed, well-nourished, and well-hydrated.   Head:  normocephalic and atraumatic.   Eyes:  conjunctiva clear Nose:  no nasal discharge.   Mouth:  pharynx pink and moist.   Neck:  no masses.   Lungs:  Normal respiratory effort, chest expands symmetrically. Lungs are clear to auscultation, no crackles.poor BS over the L upper lobe,  Dry hacking cough with exp wheeze  Heart:  normal rate, regular rhythm, and no murmur.   Skin:  color normal.   Cervical Nodes:  No lymphadenopathy noted  Impression & Recommendations:  Problem # 1:  PNEUMONIA, ATYPICAL (ICD-486) LUL PNA on CXR at the ED Fri night -- was already on treatment from our office for this but has continued to have dry hacking cough.  On MTX (held last wk's dose) and apparently has hx of PNA growing up.  Will add 5 more days Avelox (samples given).  lortab elixir RFd.  REst, clear fluids and f/u in 4 wks.  Call if any problems.  Pulse ox improving.  Will send note to Dr Dareen Piano.  Cont to hold MTX injection for this wk also. The following medications were removed from the medication list:    Zithromax Z-pak 250 Mg Tabs (Azithromycin) .Marland Kitchen... Take as  directed  Complete Medication List: 1)  Levothyroxine Sodium 100 Mcg Tabs (Levothyroxine sodium) .Marland Kitchen.. 1 tab by mouth daily 2)  Omeprazole 20 Mg Cpdr (Omeprazole) .Marland Kitchen.. 1 tab by mouth once daily 3)  Ortho Tri-cyclen Lo 0.18/0.215/0.25 Mg-25 Mcg Tabs (Norgestim-eth estrad triphasic) .Marland Kitchen.. 1 tab by mouth once daily 4)  Hydrocodone-acetaminophen 5-500 Mg Tabs (Hydrocodone-acetaminophen) .... As directed 5)  Metrogel 1 % Gel (Metronidazole) .... Use as directed by derm 6)  Folic Acid 800 Mcg Tabs (Folic acid) .... Daily 7)  Methotrexate 1 Mg  .... Use as directed once weekly 8)  Citalopram Hydrobromide 20 Mg Tabs (Citalopram hydrobromide) .Marland Kitchen.. 1 tab by mouth daily 9)  Lortab 7.5-500 Mg/18ml Elix (Hydrocodone-acetaminophen) .Marland Kitchen.. 10 ml by mouth q 6 hrs as needed cough  Patient Instructions: 1)  Take 5 more days of antibiotics: AVELOX ONE TAB ONCE A DAY FOR 5 DAYS. 2)  Use Inhaler 2 puffs 4 x a day thru Friday then move to as needed for wheezing. 3)  Stay on RX cough syrup for another 5-7 days. 4)  Call if not improved by Friday afternoon. 5)  REturn for follow up pneumonia in 4 wks. Prescriptions: LORTAB 7.5-500 MG/15ML ELIX (HYDROCODONE-ACETAMINOPHEN) 10 ml by mouth q 6 hrs as needed cough  #200 ml x 0   Entered and Authorized by:   Seymour Bars DO   Signed by:   Seymour Bars DO on 09/07/2010   Method used:   Printed then faxed to ...       [facility] [phone]* (retail)       [address] [place], [place]  [address]       Ph: [phone]       Fax: [phone]   RxID:   [id number]    Orders Added: 1)  Est. Patient Level IV [24401]

## 2010-10-29 NOTE — Assessment & Plan Note (Signed)
Summary: f/u PNA   Vital Signs:  Patient profile:   40 year old female Height:      66 inches Weight:      184 pounds BMI:     29.81 O2 Sat:      98 % on Room air Temp:     98.5 degrees F oral Pulse rate:   83 / minute BP sitting:   120 / 73  (left arm) Cuff size:   large  Vitals Entered By: Payton Spark CMA (October 08, 2010 11:11 AM)  O2 Flow:  Room air CC: 40 week f/u Pneumonia.   Primary Care Provider:  Seymour Bars DO  CC:  40 week f/u Pneumonia.Marland Kitchen  History of Present Illness: 40 yo WF presents for f/u PNA.  Her last CXR in DEC was negative.  Using inhaler as needed.  She finished out 5 more days of Avelox which really helped last month.  Her energy level is improved and her appetite has improved.  Her cough and SOB are much improved.  She has some allergic postnasal drip but is not taking anything.      Current Medications (verified): 1)  Levothyroxine Sodium 100 Mcg Tabs (Levothyroxine Sodium) .Marland Kitchen.. 1 Tab By Mouth Daily 2)  Omeprazole 20 Mg Cpdr (Omeprazole) .Marland Kitchen.. 1 Tab By Mouth Once Daily 3)  Ortho Tri-Cyclen Lo 0.18/0.215/0.25 Mg-25 Mcg Tabs (Norgestim-Eth Estrad Triphasic) .Marland Kitchen.. 1 Tab By Mouth Once Daily 4)  Hydrocodone-Acetaminophen 5-500 Mg Tabs (Hydrocodone-Acetaminophen) .... As Directed 5)  Metrogel 1 % Gel (Metronidazole) .... Use As Directed By Derm 6)  Folic Acid 800 Mcg Tabs (Folic Acid) .... Daily 7)  Methotrexate 1 Mg .... Use As Directed Once Weekly 8)  Citalopram Hydrobromide 20 Mg Tabs (Citalopram Hydrobromide) .Marland Kitchen.. 1 Tab By Mouth Daily  Allergies (verified): 1)  ! Morphine Sulfate (Morphine Sulfate)  Past History:  Past Medical History: Reviewed history from 02/18/2010 and no changes required. Hypothyroidism GERD G2P1011-- [facility] Care Pseudogout/ RA - Dr Dareen Piano  Past Surgical History: Reviewed history from 06/26/2010 and no changes required. Exploratory laparoscopy 1985, 2004 for ovarian cysts, infertility Arthroscopic surgery both knees  R 1985 (removed patella), L on 1988 Essure done   Social History: Reviewed history from 08/25/2010 and no changes required. Married to [employer] Never Smoked Alcohol use-no  Drug use-no Regular exercise-no, limited by knee pain. 89 pre- pregnancy wt. Has toddler son  Review of Systems      See HPI  Physical Exam  General:  alert, well-developed, well-nourished, well-hydrated, and overweight-appearing.   Head:  normocephalic and atraumatic.   Eyes:  conjunctiva clear Nose:  no nasal discharge.   Mouth:  pharynx pink and moist.   Neck:  no masses.   Lungs:  Normal respiratory effort, chest expands symmetrically. Lungs are clear to auscultation, no crackles or wheezes. Heart:  Normal rate and regular rhythm. S1 and S2 normal without gallop, murmur, click, rub or other extra sounds. Skin:  color normal.   Cervical Nodes:  No lymphadenopathy noted Psych:  good eye contact, not anxious appearing, and not depressed appearing.     Impression & Recommendations:  Problem # 1:  PNEUMONIA, ATYPICAL (ICD-486) Assessment Improved Resolved after 10 days of Avelox total.  She is off all meds now and is feeling better.  CXR from Dec was normal.   No further tx needed.  Problem # 2:  ALLERGIC RHINITIS (ICD-477.9) Has some postnasal drip .  I advised use of otc claritin or allegra. she declined  nasal sprays.  Problem # 3:  DEPRESSION (ICD-311) Mood stable on Citalopram. Her updated medication list for this problem includes:    Citalopram Hydrobromide 20 Mg Tabs (Citalopram hydrobromide) .Marland Kitchen... 1 tab by mouth daily  Problem # 4:  RHEUMATOID ARTHRITIS (ICD-714.0) Has f/u with Dr Dareen Piano in March. Orders: T-CBC No Diff ([id number])  Complete Medication List: 1)  Levothyroxine Sodium 100 Mcg Tabs (Levothyroxine sodium) .Marland Kitchen.. 1 tab by mouth daily 2)  Omeprazole 20 Mg Cpdr (Omeprazole) .Marland Kitchen.. 1 tab by mouth once daily 3)  Ortho Tri-cyclen Lo 0.18/0.215/0.25 Mg-25 Mcg Tabs (Norgestim-eth  estrad triphasic) .Marland Kitchen.. 1 tab by mouth once daily 4)  Hydrocodone-acetaminophen 5-500 Mg Tabs (Hydrocodone-acetaminophen) .... As directed 5)  Metrogel 1 % Gel (Metronidazole) .... Use as directed by derm 6)  Folic Acid 800 Mcg Tabs (Folic acid) .... Daily 7)  Methotrexate 1 Mg  .... Use as directed once weekly 8)  Citalopram Hydrobromide 20 Mg Tabs (Citalopram hydrobromide) .Marland Kitchen.. 1 tab by mouth daily  Other Orders: T-TSH ([id number]) T-Comprehensive Metabolic Panel [phone]) T-Lipid Profile [phone])  Patient Instructions: 1)  Update fasting labs one morning. 2)  Will call you w/ results. 3)  F/U with Dr Dareen Piano for RA. 4)  Return for f/u mood in 3 mos.   Orders Added: 1)  T-TSH [[id number]] 2)  T-CBC No Diff [[id number]] 3)  T-Comprehensive Metabolic Panel [[id number]] 4)  T-Lipid Profile [[id number]] 5)  Est. Patient Level IV [02725]

## 2010-10-29 NOTE — Progress Notes (Signed)
Summary: Restart methotrexate  Phone Note Call from Patient   Caller: Patient Summary of Call: Pt would like to know if it is OK to restart methotrexate today?  Please advise. Initial call taken by: Payton Spark CMA,  September 16, 2010 8:04 AM  Follow-up for Phone Call        If she's off antibiotics and no longer having FEVERS, yes, it is OK to restart. Follow-up by: Seymour Bars DO,  September 16, 2010 8:13 AM     Appended Document: Restart methotrexate Pt aware of the above

## 2010-11-04 NOTE — Progress Notes (Signed)
Summary: Nancy Wang  Phone Note Call from Patient Call back at Home Phone [phone]   Caller: Mom Summary of Call: pt mother called, stated pt has GI Wang with vomiting and diarrhea.  Pt has not vomited since 6 am this morning, but is still having watery diarrrhea when she sips water.  Advised pt to try ice chips instead.  Also could try ginger ale, 7 Up.  Increase to [employer] as she feels up to it.  Pt is currently on Methotrexate and wonders if there is anything else she needs to look out for that would be a red flag.  Please advise.  Pt will call back if symptoms increase. Initial call taken by: Francee Piccolo CMA Duncan Dull),  October 27, 2010 9:37 AM  Follow-up for Phone Call        pls call and find out how many days she has been sick and if she has a fever or blood in her stool.  She should hold her methotrexate until her vomitting and diarrhea clears up.  Can use Immodium AD 1-2 tabs 3 x a day as needed for diarrhea.  Does she want RX for nausea?  Drink diluted gatorade (sips) instead of soda and advance to [employer] once feeling better.   Follow-up by: Seymour Bars DO,  October 27, 2010 9:44 AM  Additional Follow-up for Phone Call Additional follow up Details #1::        RC from pt's mother.  She has not had a fever or any blood in stool.  She is agreeable with holding methotrexate until symptoms resolved.  They will pick up gatorade and Immodium at pharmacy.  They would like something called in for nausea.  [facility] Additional Follow-up by: Francee Piccolo CMA (AAMA),  October 27, 2010 10:00 AM    Additional Follow-up for Phone Call Additional follow up Details #2::    OK, will send in Promethazine as needed for nausea. If not improved in 48-72 hrs, pls call. Follow-up by: Seymour Bars DO,  October 27, 2010 10:03 AM  New/Updated Medications: PROMETHAZINE HCL 25 MG TABS (PROMETHAZINE HCL) 1 tab by mouth q 6 hrs as needed nausea  Prescriptions: PROMETHAZINE HCL 25 MG  TABS (PROMETHAZINE HCL) 1 tab by mouth q 6 hrs as needed nausea  #15 x 0   Entered and Authorized by:   Seymour Bars DO   Signed by:   Seymour Bars DO on 10/27/2010   Method used:   Electronically to        [facility] [phone]* (retail)       [address]       [place], [place]  [address]       Ph: [phone]       Fax: [phone]   RxID:   [id number]

## 2010-12-10 ENCOUNTER — Ambulatory Visit (INDEPENDENT_AMBULATORY_CARE_PROVIDER_SITE_OTHER): Payer: BC Managed Care – PPO | Admitting: Family Medicine

## 2010-12-10 ENCOUNTER — Encounter: Payer: Self-pay | Admitting: Family Medicine

## 2010-12-10 DIAGNOSIS — M069 Rheumatoid arthritis, unspecified: Secondary | ICD-10-CM

## 2010-12-10 DIAGNOSIS — F329 Major depressive disorder, single episode, unspecified: Secondary | ICD-10-CM

## 2010-12-10 DIAGNOSIS — E039 Hypothyroidism, unspecified: Secondary | ICD-10-CM

## 2010-12-10 DIAGNOSIS — F3289 Other specified depressive episodes: Secondary | ICD-10-CM

## 2010-12-10 DIAGNOSIS — E559 Vitamin D deficiency, unspecified: Secondary | ICD-10-CM | POA: Insufficient documentation

## 2010-12-11 ENCOUNTER — Encounter: Payer: Self-pay | Admitting: Family Medicine

## 2010-12-15 NOTE — Assessment & Plan Note (Signed)
Summary: f/u mood/ thyroid   Vital Signs:  Patient profile:   40 year old female Height:      66 inches Weight:      181 pounds BMI:     29.32 O2 Sat:      96 % on Room air Pulse rate:   86 / minute BP sitting:   109 / 68  (left arm) Cuff size:   large  Vitals Entered By: Payton Spark CMA (December 10, 2010 11:14 AM)  O2 Flow:  Room air CC: 2 mo f/u    Primary Care Provider:  Seymour Bars DO  CC:  2 mo f/u .  History of Present Illness: 40 yo WF presents for f/u depression.  She feels much better  with the increase in dosage of Citalopram (20 mg) daily.  She is still having joint pains from her RA and has f/u with Dr Dareen Piano in a wk.  She had a bout of PNA this winter and the GI bug last month.  She is feeling much better  now, just left with some chronic mild fatigue and wonders what she can do to help her immune system.    she is not taking anything for joint pains.  Wants to be alert for her young son.  She is due for TSH recheck after a drop in dose 2 mos ago to her thyroid medicine.    Current Medications (verified): 1)  Levothyroxine Sodium 75 Mcg Tabs (Levothyroxine Sodium) .Marland Kitchen.. 1 Tab By Mouth Daily 2)  Omeprazole 20 Mg Cpdr (Omeprazole) .Marland Kitchen.. 1 Tab By Mouth Once Daily 3)  Hydrocodone-Acetaminophen 5-500 Mg Tabs (Hydrocodone-Acetaminophen) .... As Directed 4)  Metrogel 1 % Gel (Metronidazole) .... Use As Directed By Derm 5)  Folic Acid 800 Mcg Tabs (Folic Acid) .... Daily 6)  Methotrexate 1 Mg .... Use As Directed Once Weekly 7)  Citalopram Hydrobromide 20 Mg Tabs (Citalopram Hydrobromide) .Marland Kitchen.. 1 Tab By Mouth Daily 8)  Promethazine Hcl 25 Mg Tabs (Promethazine Hcl) .Marland Kitchen.. 1 Tab By Mouth Q 6 Hrs As Needed Nausea  Allergies (verified): 1)  ! Morphine Sulfate (Morphine Sulfate)  Past History:  Past Medical History: Reviewed history from 02/18/2010 and no changes required. Hypothyroidism GERD [id number]-- [facility] Pseudogout/ RA - Dr Dareen Piano  Past Surgical  History: Reviewed history from 06/26/2010 and no changes required. Exploratory laparoscopy 1985, 2004 for ovarian cysts, infertility Arthroscopic surgery both knees R 1985 (removed patella), L on 1988 Essure done   Family History: Reviewed history from 06/26/2010 and no changes required. Mother HTN DM Father deceased CAD Prostate CA, ? depression Brother healthy Family History Diabetes 1st degree relative Family History Hypertension  Social History: Reviewed history from 08/25/2010 and no changes required. Married to [employer] Never Smoked Alcohol use-no  Drug use-no Regular exercise-no, limited by knee pain. 89 pre- pregnancy wt. Has toddler son  Review of Systems      See HPI  Physical Exam  General:  alert, well-developed, well-nourished, and well-hydrated.   Neck:  no masses and no thyromegaly.   Lungs:  Normal respiratory effort, chest expands symmetrically. Lungs are clear to auscultation, no crackles or wheezes. Heart:  Normal rate and regular rhythm. S1 and S2 normal without gallop, murmur, click, rub or other extra sounds. Skin:  color normal.   Psych:  good eye contact, not anxious appearing, and not depressed appearing.     Impression & Recommendations:  Problem # 1:  DEPRESSION (ICD-311) Assessment Improved Continue current med.  f/u  in 3 mos. Her updated medication list for this problem includes:    Citalopram Hydrobromide 20 Mg Tabs (Citalopram hydrobromide) .Marland Kitchen... 1 tab by mouth daily  Problem # 2:  HYPOTHYROIDISM (ICD-244.9) Due for repeat TSH today and added a Vit D level. Her updated medication list for this problem includes:    Levothyroxine Sodium 75 Mcg Tabs (Levothyroxine sodium) .Marland Kitchen... 1 tab by mouth daily  Orders: T-TSH [phone])  Labs Reviewed: TSH: 0.302 (10/13/2010)    Chol: 188 (10/13/2010)   HDL: 42 (10/13/2010)   LDL: 129 (10/13/2010)   TG: 84 (10/13/2010)  Problem # 3:  RHEUMATOID ARTHRITIS (ICD-714.0) has f/u with Dr Dareen Piano.   I did give her RX for Meloxicam to use as needed joint pain since she has had a flare up and was afraid to take anything.  Did warn her not to combine with other NSAIDs. Her updated medication list for this problem includes:    Meloxicam 7.5 Mg Tabs (Meloxicam) .Marland Kitchen... 1-2 tabs by mouth once daily with food for joint pain  Complete Medication List: 1)  Levothyroxine Sodium 75 Mcg Tabs (Levothyroxine sodium) .Marland Kitchen.. 1 tab by mouth daily 2)  Omeprazole 20 Mg Cpdr (Omeprazole) .Marland Kitchen.. 1 tab by mouth once daily 3)  Hydrocodone-acetaminophen 5-500 Mg Tabs (Hydrocodone-acetaminophen) .... As directed 4)  Metrogel 1 % Gel (Metronidazole) .... Use as directed by derm 5)  Folic Acid 800 Mcg Tabs (Folic acid) .... Daily 6)  Methotrexate 1 Mg  .... Use as directed once weekly 7)  Citalopram Hydrobromide 20 Mg Tabs (Citalopram hydrobromide) .Marland Kitchen.. 1 tab by mouth daily 8)  Meloxicam 7.5 Mg Tabs (Meloxicam) .Marland Kitchen.. 1-2 tabs by mouth once daily with food for joint pain  Other Orders: T-Vitamin D (25-Hydroxy) ([id number])  Patient Instructions: 1)  Use Meloxicam as needed for joint pains, take with food. 2)  Labs today. 3)  Will call you w/ results tomorrow. 4)  OK to add OTC Vitamin C and D for immune system.   5)  Return for f/u in 3 mos. Prescriptions: MELOXICAM 7.5 MG TABS (MELOXICAM) 1-2 tabs by mouth once daily with food for joint pain  #60 x 1   Entered and Authorized by:   Seymour Bars DO   Signed by:   Seymour Bars DO on 12/10/2010   Method used:   Electronically to        [facility] [phone]* (retail)       [address]       [place], [place]  [address]       Ph: [phone]       Fax: [phone]   RxID:   [id number]    Orders Added: 1)  T-TSH [[id number]] 2)  T-Vitamin D (25-Hydroxy) [phone] 3)  Est. Patient Level III [[address]606]

## 2011-02-03 ENCOUNTER — Other Ambulatory Visit: Payer: Self-pay | Admitting: *Deleted

## 2011-02-03 MED ORDER — CITALOPRAM HYDROBROMIDE 20 MG PO TABS: 20.0000 mg | ORAL_TABLET | Freq: Every day | ORAL | Status: DC

## 2011-02-26 ENCOUNTER — Other Ambulatory Visit: Payer: Self-pay | Admitting: Family Medicine

## 2011-03-16 ENCOUNTER — Encounter: Payer: Self-pay | Admitting: Family Medicine

## 2011-03-17 ENCOUNTER — Ambulatory Visit (INDEPENDENT_AMBULATORY_CARE_PROVIDER_SITE_OTHER): Payer: BC Managed Care – PPO | Admitting: Family Medicine

## 2011-03-17 ENCOUNTER — Encounter: Payer: Self-pay | Admitting: Family Medicine

## 2011-03-17 DIAGNOSIS — M069 Rheumatoid arthritis, unspecified: Secondary | ICD-10-CM

## 2011-03-17 DIAGNOSIS — E559 Vitamin D deficiency, unspecified: Secondary | ICD-10-CM

## 2011-03-17 DIAGNOSIS — E039 Hypothyroidism, unspecified: Secondary | ICD-10-CM

## 2011-03-17 NOTE — Patient Instructions (Signed)
Check Vitamin D level today.  Will call you w/ results tomorrow.  Return for f/u thyroid in 3-4 mos.

## 2011-03-17 NOTE — Assessment & Plan Note (Signed)
Due for 25 OH vit D level today after stopping RX vit d 3 wks ago.  On 1,000 IU OTC vit D now.

## 2011-03-17 NOTE — Assessment & Plan Note (Signed)
TSH perfect 3 mos ago and stayed on current dose of levothyroxine.  Continue and recheck lab in 3 mos.

## 2011-03-17 NOTE — Progress Notes (Signed)
  Subjective:    Patient ID: Nancy Wang, female    DOB: [birth year], 40 y.o.   MRN: [id number]  HPI  40 yo WF presents for f/u visit.  Her mood is stable on citalopram.  Her TSH was perfect 3 mos ago on current dose of Levothyroxine.  She is due for Vit D leveltoday after taking RX vit D for 8 wks. She is taking Vitamin D1,000 IU OTC now.  She sees Dr Dareen Piano for her RA.    BP 120/75  Pulse 85  Ht 5\' 6"  (1.676 m)  Wt 191 lb (86.637 kg)  BMI 30.83 kg/m2  SpO2 96%  LMP 03/10/2011    Review of Systems as per HPI     Objective:   Physical Exam  Constitutional: She appears well-developed and well-nourished. No distress.       overwt  Neck: No thyromegaly present.  Cardiovascular: Normal rate, regular rhythm and normal heart sounds.   Pulmonary/Chest: Effort normal and breath sounds normal.  Musculoskeletal: She exhibits no edema.  Skin: Skin is warm and dry.  Psychiatric: She has a normal mood and affect.          Assessment & Plan:

## 2011-03-17 NOTE — Assessment & Plan Note (Signed)
Follows with Dr Dareen Piano.

## 2011-03-18 ENCOUNTER — Telehealth: Payer: Self-pay | Admitting: Family Medicine

## 2011-03-18 NOTE — Telephone Encounter (Signed)
Call patient: And he is back up into the normal level this looks fantastic. She c K. and continue a supplemental dose which is around [phone] units a day.

## 2011-03-18 NOTE — Telephone Encounter (Signed)
Pt aware of the above  

## 2011-05-07 ENCOUNTER — Encounter: Payer: Self-pay | Admitting: Family Medicine

## 2011-05-07 ENCOUNTER — Ambulatory Visit (INDEPENDENT_AMBULATORY_CARE_PROVIDER_SITE_OTHER): Payer: BC Managed Care – PPO | Admitting: Family Medicine

## 2011-05-07 VITALS — BP 108/77 | HR 95 | Temp 98.9°F | Ht 63.0 in | Wt 193.0 lb

## 2011-05-07 DIAGNOSIS — J189 Pneumonia, unspecified organism: Secondary | ICD-10-CM

## 2011-05-07 MED ORDER — ALBUTEROL SULFATE HFA 108 (90 BASE) MCG/ACT IN AERS: 2.0000 | INHALATION_SPRAY | RESPIRATORY_TRACT | Status: DC | PRN

## 2011-05-07 MED ORDER — DOXYCYCLINE HYCLATE 100 MG PO CAPS: 100.0000 mg | ORAL_CAPSULE | Freq: Two times a day (BID) | ORAL | Status: AC

## 2011-05-07 NOTE — Patient Instructions (Signed)
Hold today's Methotrexate.  Start Doxycycline (antibiotic) 2 x a day for 10 days to cover for walking pneumonia. Use OTC Mucinex DM or Delsym during the day and Nyquil at night for cough. Use ProAir inhaler 2 puffs 4-6  X a day for the next 10 days.  Return for f/u in 7 days, sooner if needed.

## 2011-05-07 NOTE — Progress Notes (Signed)
  Subjective:    Patient ID: Nancy Wang, female    DOB: Mar 31, 1971, 40 y.o.   MRN: [id number]  HPI  40 yo WF presents for a cold that started 2 days ago with runny nose and a sore throat.  Her son has a cold.  She had a bout of pneumonia < 6 mos ago.  She is on MTX injection on Friday and Luflenomide daily for her RA.  She is taking some Nyquil last night.  She is coughing a lot.  She is not producing anything.  She has some chest tightness.  No wheezing or SOB.  She has had a low grade fever.  She has no energy energy and loss of appetite.  Denies N/V/D.  She has little rhinorrhea, no sinus pressure.   BP 108/77  Pulse 95  Temp(Src) 98.9 F (37.2 C) (Oral)  Ht 5\' 3"  (1.6 m)  Wt 193 lb (87.544 kg)  BMI 34.19 kg/m2  SpO2 98%  PF 360 L/min  LMP 05/06/2011   Review of Systems  Constitutional: Positive for fever, chills, appetite change and fatigue.  HENT: Positive for sore throat, rhinorrhea and postnasal drip. Negative for congestion.   Eyes: Negative for discharge.  Respiratory: Positive for chest tightness, shortness of breath and stridor.   Cardiovascular: Negative for chest pain.  Gastrointestinal: Positive for nausea. Negative for vomiting and diarrhea.  Skin: Negative for rash.  Neurological: Negative for headaches.       Objective:   Physical Exam  Constitutional: She appears well-developed and well-nourished. No distress.  HENT:  Head: Normocephalic and atraumatic.  Right Ear: External ear normal.  Left Ear: External ear normal.  Nose: Nose normal.  Mouth/Throat: Oropharynx is clear and moist.       O/p injected  Eyes: Conjunctivae are normal.  Cardiovascular: Normal rate, regular rhythm and normal heart sounds.   Pulmonary/Chest: No respiratory distress. She has wheezes (forced exp wheeze with dry hacking cough).  Lymphadenopathy:    She has cervical adenopathy.  Skin: Skin is warm and dry. No rash noted.          Assessment & Plan:

## 2011-05-07 NOTE — Assessment & Plan Note (Signed)
Likely early atypical pneumonia given rapid progession to dry hacking cough with chest tightness and SOB, on immunosuppressants.  Will treat with Doxycycline (since macrolides are contraindicated with use of Celexa).  Use Delsym for day and Nyquil for night for cough and add Proair 4-6 x a day for the next wk for chest tightness.  PFs are reassuring so held off on steroids.  Hold MTX today.  Call if any problems.  Go to the ED if clinically worsening.  F/u in 7 days.

## 2011-05-12 ENCOUNTER — Encounter: Payer: Self-pay | Admitting: Family Medicine

## 2011-05-18 ENCOUNTER — Encounter: Payer: Self-pay | Admitting: Family Medicine

## 2011-05-18 ENCOUNTER — Ambulatory Visit (INDEPENDENT_AMBULATORY_CARE_PROVIDER_SITE_OTHER): Payer: BC Managed Care – PPO | Admitting: Family Medicine

## 2011-05-18 VITALS — BP 117/73 | HR 89 | Temp 98.9°F | Ht 63.0 in | Wt 193.0 lb

## 2011-05-18 DIAGNOSIS — Z23 Encounter for immunization: Secondary | ICD-10-CM

## 2011-05-18 DIAGNOSIS — J189 Pneumonia, unspecified organism: Secondary | ICD-10-CM

## 2011-05-18 NOTE — Patient Instructions (Signed)
Flu shot today.  Glad you are feeling better!    You are welcome to follow up with either Dr Thurmond Butts or Dr Linford Arnold here or Dr Milinda Cave or Dr Rogelia Rohrer at [facility] for follow up visit.

## 2011-05-18 NOTE — Progress Notes (Signed)
  Subjective:    Patient ID: Nancy Wang, female    DOB: Dec 11, 1970, 40 y.o.   MRN: [id number]  HPI 40 yo WF seen for f/u pneumonia from a visit on 8-10.  Her PFs improved from 360 last visit to 460 today.  She was treated with doxycycline, proair and delsym as needed for cough.  She is feeling better but still has a little postnasal drip with mild cough.  She is not having chest tighness or SOB. No fevers or chills.  Her energy level has improved but not her appetite but she is on leuflenamide for her RA.  She did use the ProAir inhaler which really helped.  She is not around cigarette smoke.    BP 117/73  Pulse 89  Temp(Src) 98.9 F (37.2 C) (Oral)  Ht 5\' 3"  (1.6 m)  Wt 193 lb (87.544 kg)  BMI 34.19 kg/m2  SpO2 97%  PF 460 L/min  LMP 05/06/2011   Review of Systems  Constitutional: Negative for fever, chills and fatigue.  HENT: Positive for rhinorrhea and postnasal drip. Negative for congestion and sore throat.   Respiratory: Positive for cough. Negative for chest tightness, shortness of breath and wheezing.   Cardiovascular: Negative for chest pain.  Gastrointestinal: Negative for nausea, abdominal pain and diarrhea.  Skin: Negative for rash.  Neurological: Negative for headaches.       Objective:   Physical Exam  Constitutional: She appears well-developed and well-nourished. No distress.  HENT:  Right Ear: External ear normal.  Left Ear: External ear normal.  Nose: Nose normal.  Mouth/Throat: Oropharynx is clear and moist.  Eyes: Conjunctivae are normal.  Cardiovascular: Normal rate, regular rhythm and normal heart sounds.   Pulmonary/Chest: Effort normal and breath sounds normal. No respiratory distress. She has no wheezes.  Lymphadenopathy:    She has no cervical adenopathy.  Skin: Skin is warm and dry. No rash noted.  Psychiatric: She has a normal mood and affect.          Assessment & Plan:

## 2011-05-18 NOTE — Assessment & Plan Note (Signed)
Much improved with improvement of PFs frm 360 to 460.  Flu shot given today.  Can move ProAir to as needed.

## 2011-06-18 ENCOUNTER — Other Ambulatory Visit: Payer: Self-pay | Admitting: *Deleted

## 2011-06-18 MED ORDER — CITALOPRAM HYDROBROMIDE 20 MG PO TABS: 20.0000 mg | ORAL_TABLET | Freq: Every day | ORAL | Status: DC

## 2011-08-16 ENCOUNTER — Ambulatory Visit (INDEPENDENT_AMBULATORY_CARE_PROVIDER_SITE_OTHER): Payer: BC Managed Care – PPO | Admitting: Family Medicine

## 2011-08-16 ENCOUNTER — Encounter: Payer: Self-pay | Admitting: Family Medicine

## 2011-08-16 VITALS — BP 116/80 | HR 92 | Temp 99.0°F | Ht 66.0 in | Wt 196.0 lb

## 2011-08-16 DIAGNOSIS — J40 Bronchitis, not specified as acute or chronic: Secondary | ICD-10-CM

## 2011-08-16 DIAGNOSIS — J398 Other specified diseases of upper respiratory tract: Secondary | ICD-10-CM

## 2011-08-16 MED ORDER — HYDROCOD POLST-CHLORPHEN POLST 10-8 MG/5ML PO LQCR: 5.0000 mL | Freq: Two times a day (BID) | ORAL | Status: DC | PRN

## 2011-08-16 MED ORDER — IPRATROPIUM BROMIDE 0.02 % IN SOLN: 0.5000 mg | Freq: Once | RESPIRATORY_TRACT | Status: DC

## 2011-08-16 MED ORDER — SODIUM CHLORIDE 0.9 % IV SOLN: 125.0000 mg | Freq: Once | INTRAVENOUS | Status: DC

## 2011-08-16 MED ORDER — ALBUTEROL SULFATE (5 MG/ML) 0.5% IN NEBU: 2.5000 mg | INHALATION_SOLUTION | Freq: Once | RESPIRATORY_TRACT | Status: DC

## 2011-08-16 MED ORDER — LEVOFLOXACIN 750 MG PO TABS: 750.0000 mg | ORAL_TABLET | Freq: Every day | ORAL | Status: AC

## 2011-08-16 NOTE — Patient Instructions (Signed)

## 2011-08-16 NOTE — Progress Notes (Signed)
  Subjective:    Patient ID: Nancy Wang, female    DOB: 05-31-1971, 40 y.o.   MRN: [id number]  Cough This is a new problem. The current episode started in the past 7 days. The problem has been gradually worsening. The problem occurs every few hours. The cough is non-productive. Associated symptoms include shortness of breath and wheezing. Pertinent negatives include no chest pain, chills, ear congestion, ear pain, fever, headaches, heartburn, postnasal drip, rash, rhinorrhea or sore throat. The symptoms are aggravated by lying down. She has tried a beta-agonist inhaler for the symptoms. Her past medical history is significant for asthma, bronchitis and pneumonia. There is no history of COPD, emphysema or environmental allergies.      Review of Systems  Constitutional: Negative for fever and chills.  HENT: Negative for ear pain, sore throat, rhinorrhea and postnasal drip.   Respiratory: Positive for cough, shortness of breath and wheezing.   Cardiovascular: Negative for chest pain.  Gastrointestinal: Negative for heartburn.  Skin: Negative for rash.  Neurological: Negative for headaches.  Hematological: Negative for environmental allergies.  All other systems reviewed and are negative.   BP 116/80  Pulse 92  Temp(Src) 99 F (37.2 C) (Oral)  Ht 5\' 6"  (1.676 m)  Wt 196 lb (88.905 kg)  BMI 31.64 kg/m2  SpO2 96%  LMP 08/02/2011     Objective:   Physical Exam  Nursing note and vitals reviewed. Constitutional: She is oriented to person, place, and time. She appears well-developed and well-nourished. No distress.  HENT:  Head: Normocephalic and atraumatic.  Right Ear: Tympanic membrane normal.  Left Ear: Tympanic membrane normal.  Nose: Nose normal.  Mouth/Throat: Oropharynx is clear and moist. No oropharyngeal exudate.  Eyes: Right eye exhibits no discharge. Left eye exhibits no discharge. No scleral icterus.  Neck: Normal range of motion. Neck supple.  Cardiovascular:  Normal rate, regular rhythm and normal heart sounds.   Pulmonary/Chest: Effort normal. No respiratory distress. She has wheezes. She has rhonchi. She has no rales.  Lymphadenopathy:    She has no cervical adenopathy.  Neurological: She is alert and oriented to person, place, and time.  Skin: Skin is warm and dry.          Assessment & Plan:   bronchitis tracheal pharyngitis will give 125 mg IM ago I am DuoNeb breathing treatment return in one to 2 weeks not better. Work note to decline.

## 2011-09-17 ENCOUNTER — Other Ambulatory Visit: Payer: Self-pay | Admitting: *Deleted

## 2011-09-17 MED ORDER — LEVOTHYROXINE SODIUM 75 MCG PO TABS: 75.0000 ug | ORAL_TABLET | Freq: Every day | ORAL | Status: DC

## 2011-11-10 ENCOUNTER — Other Ambulatory Visit: Payer: Self-pay | Admitting: Family Medicine

## 2011-11-10 ENCOUNTER — Encounter: Payer: Self-pay | Admitting: *Deleted

## 2011-11-15 ENCOUNTER — Other Ambulatory Visit: Payer: Self-pay | Admitting: Family Medicine

## 2011-11-17 ENCOUNTER — Ambulatory Visit: Payer: BC Managed Care – PPO | Admitting: Family Medicine

## 2011-11-17 DIAGNOSIS — Z0289 Encounter for other administrative examinations: Secondary | ICD-10-CM

## 2012-01-12 ENCOUNTER — Ambulatory Visit (INDEPENDENT_AMBULATORY_CARE_PROVIDER_SITE_OTHER): Payer: BC Managed Care – PPO | Admitting: Physician Assistant

## 2012-01-12 ENCOUNTER — Encounter: Payer: Self-pay | Admitting: Physician Assistant

## 2012-01-12 VITALS — BP 124/75 | HR 98 | Temp 99.2°F | Ht 66.0 in | Wt 200.0 lb

## 2012-01-12 DIAGNOSIS — J40 Bronchitis, not specified as acute or chronic: Secondary | ICD-10-CM

## 2012-01-12 DIAGNOSIS — J302 Other seasonal allergic rhinitis: Secondary | ICD-10-CM

## 2012-01-12 DIAGNOSIS — J309 Allergic rhinitis, unspecified: Secondary | ICD-10-CM

## 2012-01-12 MED ORDER — CICLESONIDE 37 MCG/ACT NA AERS: 1.0000 | INHALATION_SPRAY | Freq: Every day | NASAL | Status: DC

## 2012-01-12 MED ORDER — ALBUTEROL SULFATE HFA 108 (90 BASE) MCG/ACT IN AERS: 2.0000 | INHALATION_SPRAY | RESPIRATORY_TRACT | Status: DC | PRN

## 2012-01-12 MED ORDER — METHYLPREDNISOLONE ACETATE 80 MG/ML IJ SUSP
80.0000 mg | Freq: Once | INTRAMUSCULAR | Status: AC
  Administered 2012-01-12: 80 mg via INTRAMUSCULAR

## 2012-01-12 NOTE — Progress Notes (Signed)
  Subjective:    Patient ID: Nancy Wang, female    DOB: 08-Sep-1971, 41 y.o.   MRN: [id number]  HPI Patient presents to the clinic with cough and shortness of breath for 2 days. She has a history of her cough quickly turning into bronchits and pneumonia. She had a low grade fever that started this morning. She has had some wheezing but mostly at night. She has not tried or done anything to help with cough.  She denies N/V, chills, sore throat, muscle pain, or ear pain. She has a history of problems with her allergies.  Review of Systems     Objective:   Physical Exam  Constitutional: She is oriented to person, place, and time. She appears well-developed and well-nourished.  HENT:  Head: Normocephalic and atraumatic.  Right Ear: External ear normal.  Left Ear: External ear normal.  Mouth/Throat: Oropharynx is clear and moist.       Bilateral turbinates red and swollen. TM's normal. Neg for maxillary tenderness.  Eyes: Conjunctivae are normal.  Cardiovascular: Normal rate, regular rhythm and normal heart sounds.   Pulmonary/Chest: Effort normal and breath sounds normal. She has no wheezes.       SpO2-98%  Neurological: She is alert and oriented to person, place, and time.  Skin: Skin is warm and dry.  Psychiatric: She has a normal mood and affect. Her behavior is normal.          Assessment & Plan:  Bronchitis/allergic rhinitis/seasonal allergies-Suspect bronchitis is of viral origin.Gave sample of Zetonna nasal spray to use one spray each nose once a day and gave rx with copay card. Continue on Claritin. Gave depo shot in office. Refilled albuterol inhaler to use as needed for SOB/Wheezing. If not improving call office and we can consider antibiotic or chest x-ray.

## 2012-01-12 NOTE — Patient Instructions (Signed)
Gave you sample of Zetonna nasal spray to use one spray each nose once a day and gave rx with copay card. Continue on Claritin. Gave depo shot in office. Refilled albuterol inhaler to use as needed for SOB/Wheezing. If not improving call office and we can consider antibiotic or chest x-ray.

## 2012-01-25 ENCOUNTER — Other Ambulatory Visit: Payer: Self-pay | Admitting: Family Medicine

## 2012-02-26 ENCOUNTER — Other Ambulatory Visit: Payer: Self-pay | Admitting: Family Medicine

## 2012-04-07 ENCOUNTER — Other Ambulatory Visit: Payer: Self-pay | Admitting: Family Medicine

## 2012-05-16 ENCOUNTER — Other Ambulatory Visit: Payer: Self-pay | Admitting: Physician Assistant

## 2012-05-23 ENCOUNTER — Other Ambulatory Visit: Payer: Self-pay | Admitting: *Deleted

## 2012-05-23 MED ORDER — LEVOTHYROXINE SODIUM 75 MCG PO TABS: 75.0000 ug | ORAL_TABLET | Freq: Every day | ORAL | Status: DC

## 2012-06-01 ENCOUNTER — Other Ambulatory Visit: Payer: Self-pay | Admitting: *Deleted

## 2012-06-01 MED ORDER — CITALOPRAM HYDROBROMIDE 20 MG PO TABS: 20.0000 mg | ORAL_TABLET | Freq: Every day | ORAL | Status: DC

## 2012-06-08 ENCOUNTER — Emergency Department
Admission: EM | Admit: 2012-06-08 | Discharge: 2012-06-08 | Disposition: A | Discharge status: Home / Self Care | Payer: BC Managed Care – PPO | Source: Home / Self Care | Attending: Family Medicine | Admitting: Family Medicine

## 2012-06-08 ENCOUNTER — Encounter: Payer: Self-pay | Admitting: Emergency Medicine

## 2012-06-08 DIAGNOSIS — J069 Acute upper respiratory infection, unspecified: Secondary | ICD-10-CM

## 2012-06-08 MED ORDER — CEFDINIR 300 MG PO CAPS: 300.0000 mg | ORAL_CAPSULE | Freq: Two times a day (BID) | ORAL | Status: AC

## 2012-06-08 MED ORDER — BENZONATATE 200 MG PO CAPS: 200.0000 mg | ORAL_CAPSULE | Freq: Every day | ORAL | Status: AC

## 2012-06-08 NOTE — ED Notes (Signed)
Cough, low-grade fever, congestion, scratchy throat x 2 days

## 2012-06-08 NOTE — ED Provider Notes (Signed)
History     CSN: [id number]  Arrival date & time 06/08/12  1721   First MD Initiated Contact with Patient 06/08/12 1739      Chief Complaint  Patient presents with  . Cough     HPI Comments: Patient complains of 2 day history of gradually progressive URI symptoms beginning with a mild sore throat, followed by progressive nasal congestion.  A cough started today.  Complains of fatigue and initial myalgias.  Cough is generally non-productive.  There has been no pleuritic pain.  She feels chest tightness, improved with an albuterol inhaler.  She has had sweats and fever to 100.9.  She has a past history of pneumonia.  She has a family history of asthma.   The history is provided by the patient.    Past Medical History  Diagnosis Date  . Thyroid disease     hypothyroidism  . GERD (gastroesophageal reflux disease)   . Pseudogout     Dr. Dareen Piano    Past Surgical History  Procedure Date  . Exploratory lap 1985, 2004    for ovarian cysts, infertility  . Arthroscopic surgery     both knees, RT 1985 removed patella, LT 1988  . Essure tubal ligation   . Abdominal surgery     Family History  Problem Relation Age of Onset  . Hypertension Mother   . Diabetes Mother   . Cancer Father     prostate  . Coronary artery disease Father   . Depression Father   . Diabetes      family history  . Hypertension      family history    History  Substance Use Topics  . Smoking status: Never Smoker   . Smokeless tobacco: Not on file  . Alcohol Use: No    OB History    Grav Para Term Preterm Abortions TAB SAB Ect Mult Living                  Review of Systems + sore throat + cough No pleuritic pain ? wheezing + nasal congestion + post-nasal drainage ? sinus pain/pressure No itchy/red eyes No earache No hemoptysis No SOB + fever, + chills No nausea No vomiting No abdominal pain No diarrhea No urinary symptoms No skin rashes + fatigue + myalgias No headache Used  OTC meds without relief  Allergies  Morphine sulfate  Home Medications   Current Outpatient Rx  Name Route Sig Dispense Refill  . ALBUTEROL SULFATE HFA 108 (90 BASE) MCG/ACT IN AERS Inhalation Inhale 2 puffs into the lungs every 4 (four) hours as needed for wheezing. 1 Inhaler 0  . BENZONATATE 200 MG PO CAPS Oral Take 1 capsule (200 mg total) by mouth at bedtime. Take as needed for cough 12 capsule 0  . CEFDINIR 300 MG PO CAPS Oral Take 1 capsule (300 mg total) by mouth 2 (two) times daily. 20 capsule 0  . HYDROCOD POLST-CPM POLST ER 10-8 MG/5ML PO LQCR Oral Take 5 mLs by mouth every 12 (twelve) hours as needed. 115 mL 0  . CICLESONIDE 37 MCG/ACT NA AERS Nasal Place 1 spray into the nose daily. 1 Inhaler 6  . CITALOPRAM HYDROBROMIDE 20 MG PO TABS Oral Take 1 tablet (20 mg total) by mouth daily. 30 tablet 2  . ERGOCALCIFEROL 50000 UNITS PO CAPS Oral Take 50,000 Units by mouth once a week.      Marland Kitchen ETANERCEPT 50 MG/ML [place] SOLN Subcutaneous Inject 50 mg into the skin once  a week.    Marland Kitchen FOLIC ACID 800 MCG PO TABS Oral Take 400 mcg by mouth daily.      Marland Kitchen HYDROCODONE-ACETAMINOPHEN 5-500 MG PO TABS Oral Take 1 tablet by mouth every 6 (six) hours as needed.      Marland Kitchen LEFLUNOMIDE 10 MG PO TABS Oral Take 10 mg by mouth daily.      Marland Kitchen LEVOTHYROXINE SODIUM 75 MCG PO TABS Oral Take 1 tablet (75 mcg total) by mouth daily. 30 tablet 1  . MELOXICAM 7.5 MG PO TABS Oral Take 7.5 mg by mouth daily.      Marland Kitchen METHOTREXATE SODIUM CHEMO INJECTION 25 MG/ML      . METHOTREXATE (ANTI-RHEUMATIC) PO Oral Take 1 mg by mouth once a week.      Marland Kitchen METRONIDAZOLE 1 % EX GEL Topical Apply topically daily.      Marland Kitchen OMEPRAZOLE 20 MG PO CPDR Oral Take 20 mg by mouth daily.        BP 129/83  Pulse 97  Temp 98.8 F (37.1 C) (Oral)  Resp 18  Ht 5\' 6"  (1.676 m)  Wt 206 lb (93.441 kg)  BMI 33.25 kg/m2  SpO2 97%  Physical Exam Nursing notes and Vital Signs reviewed. Appearance:  Patient appears stated age, and in no acute distress.   Patient is obese (BMI 33.3) Eyes:  Pupils are equal, round, and reactive to light and accomodation.  Extraocular movement is intact.  Conjunctivae are not inflamed  Ears:  Canals normal.  Tympanic membranes normal.  Nose:  Mildly congested turbinates.  Mild maxillary sinus tenderness is present.  Pharynx:  Normal Neck:  Supple.  Slightly tender shotty posterior nodes are palpated bilaterally  Lungs:  Clear to auscultation.  Breath sounds are equal.  Heart:  Regular rate and rhythm without murmurs, rubs, or gallops.  Abdomen:  Nontender without masses or hepatosplenomegaly.  Bowel sounds are present.  No CVA or flank tenderness.  Extremities:  No edema.  No calf tenderness Skin:  No rash present.   ED Course  Procedures none      1. Acute upper respiratory infections of unspecified site       MDM  Begin amoxicillin.  Prescription written for Benzonatate [facility]) to take at bedtime for night-time cough.  Take Mucinex D (guaifenesin with decongestant) twice daily for congestion.  Increase fluid intake, rest. May use Afrin nasal spray (or generic oxymetazoline) twice daily for about 5 days.  Also recommend using saline nasal spray several times daily and saline nasal irrigation (AYR is a common brand) Stop all antihistamines for now, and other non-prescription cough/cold preparations. Continue albuterol inhaler as needed. Followup with Family Doctor if not improved in one week.         Lattie Haw, MD 06/10/12 [phone]

## 2012-07-14 ENCOUNTER — Other Ambulatory Visit: Payer: Self-pay | Admitting: Family Medicine

## 2012-09-26 ENCOUNTER — Other Ambulatory Visit: Payer: Self-pay | Admitting: *Deleted

## 2012-09-26 MED ORDER — CITALOPRAM HYDROBROMIDE 20 MG PO TABS: 20.0000 mg | ORAL_TABLET | Freq: Every day | ORAL | Status: DC

## 2012-10-09 ENCOUNTER — Ambulatory Visit (INDEPENDENT_AMBULATORY_CARE_PROVIDER_SITE_OTHER): Payer: BC Managed Care – PPO | Admitting: Family Medicine

## 2012-10-09 ENCOUNTER — Encounter: Payer: Self-pay | Admitting: Family Medicine

## 2012-10-09 VITALS — BP 108/60 | HR 118 | Temp 98.6°F | Resp 18 | Wt 213.0 lb

## 2012-10-09 DIAGNOSIS — Z13220 Encounter for screening for lipoid disorders: Secondary | ICD-10-CM

## 2012-10-09 DIAGNOSIS — E039 Hypothyroidism, unspecified: Secondary | ICD-10-CM

## 2012-10-09 DIAGNOSIS — J209 Acute bronchitis, unspecified: Secondary | ICD-10-CM

## 2012-10-09 DIAGNOSIS — Z1231 Encounter for screening mammogram for malignant neoplasm of breast: Secondary | ICD-10-CM

## 2012-10-09 MED ORDER — LEVOTHYROXINE SODIUM 75 MCG PO TABS: 75.0000 ug | ORAL_TABLET | Freq: Every day | ORAL | Status: DC

## 2012-10-09 MED ORDER — AZITHROMYCIN 250 MG PO TABS: ORAL_TABLET | ORAL | Status: DC

## 2012-10-09 NOTE — Patient Instructions (Signed)
Call if you are not better in one week.

## 2012-10-09 NOTE — Progress Notes (Signed)
  Subjective:    Patient ID: Nancy Wang, female    DOB: 1971/05/30, 42 y.o.   MRN: [id number]  HPI Low grade fever, 99.5-100.2 for 2 days. Mild scartchy throat. Cough started this AM.  Feels like gets out of breath more easily today.  Some nausea.  No other GI sxs. No ear pain or pressure.   NOt taking any cough or cold medicaitons.  She is prone to bronchiolitis.  Father recently had PNA.  She is is on Enbrel and MTX. No known flu exposure.     Review of Systems     Objective:   Physical Exam  Constitutional: She is oriented to person, place, and time. She appears well-developed and well-nourished.  HENT:  Head: Normocephalic and atraumatic.  Right Ear: External ear normal.  Left Ear: External ear normal.  Nose: Nose normal.  Mouth/Throat: Oropharynx is clear and moist.       TMs and canals are clear.   Eyes: Conjunctivae normal and EOM are normal. Pupils are equal, round, and reactive to light.  Neck: Neck supple. No thyromegaly present.  Cardiovascular: Normal rate, regular rhythm and normal heart sounds.   Pulmonary/Chest: Effort normal and breath sounds normal. She has no wheezes.  Lymphadenopathy:    She has no cervical adenopathy.  Neurological: She is alert and oriented to person, place, and time.  Skin: Skin is warm and dry.  Psychiatric: She has a normal mood and affect.          Assessment & Plan:  Acute bronchitis - she is technically immunosuppressed because she's on methotrexate and Remicade. I will go ahead and treat preemptively with azithromycin. She she still has leftover cough medicine at home from a previous illness. If not she'll call the office back. Can use Tylenol as needed for fever. She says she still has a good inhaler to use at home she gets short of breath or wheezes. Her lung exam is clear today. The still could be viral at this point. Though she was on her father who had pneumonia as well. She's not improving over the next week and please call  the office back and consider chest x-ray.  Hypothyroidism-she's due for refill on her medications. Past year and half since her last thyroid level. We will draw that today.  She also needs a screening lipid panel. She will get full blood work with her next Remicade infusion sometime in February. Thus I printed her slip and she can add this to her blood work then and we can review this with her.

## 2012-10-12 ENCOUNTER — Ambulatory Visit (INDEPENDENT_AMBULATORY_CARE_PROVIDER_SITE_OTHER): Payer: BC Managed Care – PPO

## 2012-10-12 ENCOUNTER — Encounter: Payer: Self-pay | Admitting: Family Medicine

## 2012-10-12 ENCOUNTER — Ambulatory Visit (INDEPENDENT_AMBULATORY_CARE_PROVIDER_SITE_OTHER): Payer: BC Managed Care – PPO | Admitting: Family Medicine

## 2012-10-12 VITALS — BP 100/78 | Temp 98.6°F | Ht 66.0 in | Wt 214.0 lb

## 2012-10-12 DIAGNOSIS — R05 Cough: Secondary | ICD-10-CM

## 2012-10-12 DIAGNOSIS — R509 Fever, unspecified: Secondary | ICD-10-CM

## 2012-10-12 DIAGNOSIS — R0602 Shortness of breath: Secondary | ICD-10-CM

## 2012-10-12 DIAGNOSIS — J209 Acute bronchitis, unspecified: Secondary | ICD-10-CM

## 2012-10-12 DIAGNOSIS — R059 Cough, unspecified: Secondary | ICD-10-CM

## 2012-10-12 DIAGNOSIS — R0989 Other specified symptoms and signs involving the circulatory and respiratory systems: Secondary | ICD-10-CM

## 2012-10-12 MED ORDER — MOXIFLOXACIN HCL 400 MG PO TABS: 400.0000 mg | ORAL_TABLET | Freq: Every day | ORAL | Status: AC

## 2012-10-12 MED ORDER — HYDROCODONE-HOMATROPINE 5-1.5 MG/5ML PO SYRP: 5.0000 mL | ORAL_SOLUTION | Freq: Three times a day (TID) | ORAL | Status: DC | PRN

## 2012-10-12 NOTE — Progress Notes (Signed)
CC: Nancy Wang is a 42 y.o. female is here for not feeling any better since monday   Subjective: HPI:  Patient reports approximately 4 days of low-grade fever, nonproductive cough, and fatigue. Symptoms were progressively getting worse over a 24-hour period, start azithromycin on Monday and has had no improvement with any of the symptoms. They're described as moderate in severity. Present 24 hours a day. Albuterol helps but only minimally and for only one-to hours. She has some superficial sternal discomfort when coughing but no other chest or back pain. Uncertain cough medicine has been tried without improvement. Past medical history significant for multiple pneumonias and methotrexate use. She's had some mild shortness of breath with exertion which is not normal for her. She denies nasal congestion or facial pressure nor sore throat. No confusion, posttussive emesis, rashes, abdominal pain, specific joint or muscle pain, nor GI disturbance.   Review Of Systems Outlined In HPI  Past Medical History  Diagnosis Date  . Thyroid disease     hypothyroidism  . GERD (gastroesophageal reflux disease)   . Pseudogout     Dr. Dareen Piano  . RA (rheumatoid arthritis)     Follow by Azzie Roup, MD - [facility]     Family History  Problem Relation Age of Onset  . Hypertension Mother   . Diabetes Mother   . Cancer Father     prostate  . Coronary artery disease Father   . Depression Father   . Diabetes      family history  . Hypertension      family history     History  Substance Use Topics  . Smoking status: Never Smoker   . Smokeless tobacco: Not on file  . Alcohol Use: No     Objective: Filed Vitals:   10/12/12 1119  BP: 100/78  Temp: 98.6 F (37 C)    General: Alert and Oriented, No Acute Distress HEENT: Pupils equal, round, reactive to light. Conjunctivae clear.  External ears unremarkable, canals clear with intact TMs with appropriate landmarks.   Middle ear appears open without effusion. Pink inferior turbinates.  Moist mucous membranes, pharynx without inflammation nor lesions.  Neck supple without palpable lymphadenopathy nor abnormal masses. Lungs: Clear to auscultation bilaterally, no wheezing/ronchi/rales.  Comfortable work of breathing. Good air movement. Sealed-like cough Cardiac: Mild tachycardia with regular rhythm. Normal S1/S2.  No murmurs, rubs, nor gallops.   Extremities: No peripheral edema.  Strong peripheral pulses.  Mental Status: No depression, anxiety, nor agitation. Skin: Warm and dry. Neuro: Cranial nerves II through XII grossly intact  Assessment & Plan: Nancy Wang was seen today for not feeling any better since monday.  Diagnoses and associated orders for this visit:  Fever - DG Chest 2 View; Future  Cough - DG Chest 2 View; Future - Discontinue: HYDROcodone-homatropine (HYCODAN) 5-1.5 MG/5ML syrup; Take 5 mLs by mouth every 8 (eight) hours as needed for cough. - HYDROcodone-homatropine (HYCODAN) 5-1.5 MG/5ML syrup; Take 5 mLs by mouth every 8 (eight) hours as needed for cough.  Shortness of breath - DG Chest 2 View; Future  Acute bronchitis - moxifloxacin (AVELOX) 400 MG tablet; Take 1 tablet (400 mg total) by mouth daily. - Discontinue: HYDROcodone-homatropine (HYCODAN) 5-1.5 MG/5ML syrup; Take 5 mLs by mouth every 8 (eight) hours as needed for cough. - HYDROcodone-homatropine (HYCODAN) 5-1.5 MG/5ML syrup; Take 5 mLs by mouth every 8 (eight) hours as needed for cough.    Chest x-ray obtained: Personal interpretation shows no airspace disease however presence  of peribronchial thickening. No pulmonary edema nor effusion.  Start Avelox to step up to antimicrobial coverage. Schedule albuterol inhaler 2 puffs every 6 hours for the next 48 hours. Hycodan to help with cough.Signs and symptoms requring emergent/urgent reevaluation were discussed with the patient. Return Monday if not significant  improvement  Return in about 3 days (around 10/15/2012).

## 2012-10-26 ENCOUNTER — Ambulatory Visit (INDEPENDENT_AMBULATORY_CARE_PROVIDER_SITE_OTHER): Payer: BC Managed Care – PPO

## 2012-10-26 DIAGNOSIS — Z1231 Encounter for screening mammogram for malignant neoplasm of breast: Secondary | ICD-10-CM

## 2012-11-13 ENCOUNTER — Other Ambulatory Visit: Payer: Self-pay | Admitting: Family Medicine

## 2012-12-08 ENCOUNTER — Other Ambulatory Visit (HOSPITAL_COMMUNITY)
Admission: RE | Admit: 2012-12-08 | Discharge: 2012-12-08 | Disposition: A | Discharge status: Home / Self Care | Payer: BC Managed Care – PPO | Source: Ambulatory Visit | Attending: Family Medicine | Admitting: Family Medicine

## 2012-12-08 ENCOUNTER — Ambulatory Visit (INDEPENDENT_AMBULATORY_CARE_PROVIDER_SITE_OTHER): Payer: BC Managed Care – PPO | Admitting: Physician Assistant

## 2012-12-08 ENCOUNTER — Encounter: Payer: Self-pay | Admitting: Physician Assistant

## 2012-12-08 VITALS — BP 118/75 | HR 93 | Wt 216.0 lb

## 2012-12-08 DIAGNOSIS — N760 Acute vaginitis: Secondary | ICD-10-CM | POA: Insufficient documentation

## 2012-12-08 DIAGNOSIS — R300 Dysuria: Secondary | ICD-10-CM

## 2012-12-08 DIAGNOSIS — N898 Other specified noninflammatory disorders of vagina: Secondary | ICD-10-CM

## 2012-12-08 DIAGNOSIS — L293 Anogenital pruritus, unspecified: Secondary | ICD-10-CM

## 2012-12-08 LAB — POCT URINALYSIS DIPSTICK
Bilirubin, UA: NEGATIVE
Glucose, UA: NEGATIVE
Ketones, UA: NEGATIVE
Leukocytes, UA: NEGATIVE
Nitrite, UA: NEGATIVE

## 2012-12-08 MED ORDER — TRIAMCINOLONE ACETONIDE 0.5 % EX OINT: TOPICAL_OINTMENT | Freq: Two times a day (BID) | CUTANEOUS | Status: DC

## 2012-12-08 NOTE — Patient Instructions (Addendum)
Will get wet prep and call with results. Sent cream to start using.   Keep cool, especially at night time  Wear loose fitting underwear and outer clothing  Avoid nylon pantihose  Wash once or twice daily with lukewarm water alone or use a soap-free cleanser instead of conventional soap  Avoid scratching or rubbing the affected area - a single scratch keeps the itching going for a week  Avoid riding bicycles or horses  Insert tampons with care, and change sanitary pads frequently  Do not apply strong steroid creams for more than a few days.

## 2012-12-08 NOTE — Progress Notes (Signed)
  Subjective:    Patient ID: Nancy Wang, female    DOB: 10-13-1970, 42 y.o.   MRN: [id number]  HPI The patient is a 42 year old female who presents to the clinic with vaginal itching and discharge. Symptoms have been ongoing for the last 3 weeks. She has tried vagisil and provided minimal relief. There is some pain with urination. She denies any abdominal cramps, nausea, vomiting, diarrhea. She has not seen any blood in urine or bowel movements. She denies a new sexual partner. She recently finished her menstrual cycle. She has never had anything like this before. The itching and discharge is constant. She has not noticed any unusal odor. She has not had BV before.     Review of Systems     Objective:   Physical Exam  Constitutional: She is oriented to person, place, and time. She appears well-developed and well-nourished.  Cardiovascular: Normal rate, regular rhythm and normal heart sounds.   Pulmonary/Chest: Effort normal and breath sounds normal.  No CVA tenderness.  Abdominal: Soft. She exhibits no distension and no mass. There is no tenderness. There is no rebound and no guarding.  Genitourinary:   vulva presents with irritation and erythema. White thick clumpy discharge present on examination and inside vaginal.  Tiny irritated papules present on the inside of leg in groin area.   Neurological: She is alert and oriented to person, place, and time.  Psychiatric: She has a normal mood and affect. Her behavior is normal.          Assessment & Plan:  Vaginal itching/discharge/burning with urination- UA negative for blood, leuks, and nitrates. Sent wet prep off. Treated with triamincinolone for some vulvular irritation to use for next 3-7 days no longer. Declined any STD testing. Call if not improving or worsening.

## 2012-12-10 ENCOUNTER — Other Ambulatory Visit: Payer: Self-pay | Admitting: Physician Assistant

## 2012-12-12 ENCOUNTER — Other Ambulatory Visit: Payer: Self-pay | Admitting: Physician Assistant

## 2012-12-12 MED ORDER — FLUCONAZOLE 150 MG PO TABS: 150.0000 mg | ORAL_TABLET | Freq: Once | ORAL | Status: DC

## 2013-01-08 ENCOUNTER — Telehealth: Payer: Self-pay | Admitting: *Deleted

## 2013-01-08 MED ORDER — MELOXICAM 7.5 MG PO TABS: 7.5000 mg | ORAL_TABLET | ORAL | Status: DC | PRN

## 2013-01-08 NOTE — Telephone Encounter (Signed)
Pt requesting refill on Meloxicam that she uses for her arthritis. Send to [facility] in [place]

## 2013-01-08 NOTE — Telephone Encounter (Signed)
Sent to pharmacy 

## 2013-02-27 ENCOUNTER — Other Ambulatory Visit: Payer: Self-pay | Admitting: Family Medicine

## 2013-04-26 ENCOUNTER — Other Ambulatory Visit: Payer: Self-pay | Admitting: Family Medicine

## 2013-04-26 MED ORDER — LEVOTHYROXINE SODIUM 75 MCG PO TABS: ORAL_TABLET | ORAL | Status: DC

## 2013-05-24 ENCOUNTER — Other Ambulatory Visit: Payer: Self-pay | Admitting: Family Medicine

## 2013-06-25 ENCOUNTER — Other Ambulatory Visit: Payer: Self-pay | Admitting: Family Medicine

## 2013-08-02 ENCOUNTER — Encounter: Payer: Self-pay | Admitting: Family Medicine

## 2013-08-02 ENCOUNTER — Ambulatory Visit (INDEPENDENT_AMBULATORY_CARE_PROVIDER_SITE_OTHER): Payer: BC Managed Care – PPO | Admitting: Family Medicine

## 2013-08-02 VITALS — BP 122/73 | HR 107 | Temp 98.7°F | Wt 218.0 lb

## 2013-08-02 DIAGNOSIS — H9312 Tinnitus, left ear: Secondary | ICD-10-CM

## 2013-08-02 DIAGNOSIS — R51 Headache: Secondary | ICD-10-CM

## 2013-08-02 DIAGNOSIS — Z23 Encounter for immunization: Secondary | ICD-10-CM

## 2013-08-02 DIAGNOSIS — H9319 Tinnitus, unspecified ear: Secondary | ICD-10-CM

## 2013-08-02 DIAGNOSIS — F329 Major depressive disorder, single episode, unspecified: Secondary | ICD-10-CM

## 2013-08-02 DIAGNOSIS — F3289 Other specified depressive episodes: Secondary | ICD-10-CM

## 2013-08-02 DIAGNOSIS — R9431 Abnormal electrocardiogram [ECG] [EKG]: Secondary | ICD-10-CM | POA: Insufficient documentation

## 2013-08-02 DIAGNOSIS — Z5181 Encounter for therapeutic drug level monitoring: Secondary | ICD-10-CM

## 2013-08-02 MED ORDER — BUPROPION HCL ER (XL) 150 MG PO TB24: 150.0000 mg | ORAL_TABLET | ORAL | Status: DC

## 2013-08-02 MED ORDER — CITALOPRAM HYDROBROMIDE 40 MG PO TABS: 40.0000 mg | ORAL_TABLET | Freq: Every day | ORAL | Status: DC

## 2013-08-02 NOTE — Progress Notes (Addendum)
Subjective:    Patient ID: Nancy Wang, female    DOB: [birth year], 42 y.o.   MRN: [id number]  HPI Tinnitus - x 4 days left ear no visual changes, dizziness,n/v. HA on top of head x 2 days. No recent URI.  She recenlty changed her rheumatoid regimen. Off of steroid.  Still on citalopram.  No hearing loss or pressure in that ear. No drainage. No ST.  Allergies not really bothering her much. I sneezed a few times. No swollen lymph nodes. She's not really been using any allergy medication.  Has been more tearful and more irritability.  Says not sure the citalopram is not working as well. Motivation is down. Husband switched to 3rd shift about 6 weeks, this has been difficult she feels like she has adjusted. No other major stressors that she can pinpoint. She is taking her citalopram 20 mg daily without any side effects or problems. That's the only new medication she has ever taken.  Rheumatoid arthritis-she is now off of Remicade. She is still on methotrexate and they have added Simponi aria. She is not currently on any prednisone or steroids.  Review of Systems  BP 122/73  Pulse 107  Temp(Src) 98.7 F (37.1 C)  Wt 218 lb (98.884 kg)    Allergies  Allergen Reactions  . Morphine Sulfate     Past Medical History  Diagnosis Date  . Thyroid disease     hypothyroidism  . GERD (gastroesophageal reflux disease)   . Pseudogout     Dr. Dareen Piano  . RA (rheumatoid arthritis)     Follow by Azzie Roup, MD - [facility]    Past Surgical History  Procedure Laterality Date  . Exploratory lap  1985, 2004    for ovarian cysts, infertility  . Arthroscopic surgery      both knees, RT 1985 removed patella, LT 1988  . Essure tubal ligation    . Abdominal surgery      History   Social History  . Marital Status: Married    Spouse Name: N/A    Number of Children: N/A  . Years of Education: N/A   Occupational History  . Not on file.   Social History Main Topics   . Smoking status: Never Smoker   . Smokeless tobacco: Not on file  . Alcohol Use: No  . Drug Use: No  . Sexual Activity:      Comment: married, no regular exercise, limited by knee pain, has son.    Other Topics Concern  . Not on file   Social History Narrative  . No narrative on file    Family History  Problem Relation Age of Onset  . Hypertension Mother   . Diabetes Mother   . Cancer Father     prostate  . Coronary artery disease Father   . Depression Father   . Diabetes      family history  . Hypertension      family history    Outpatient Encounter Prescriptions as of 08/02/2013  Medication Sig  . citalopram (CELEXA) 40 MG tablet Take 1 tablet (40 mg total) by mouth daily.  . folic acid (FOLVITE) 800 MCG tablet Take 400 mcg by mouth daily.    . Golimumab (SIMPONI ARIA IV) Inject into the vein.  Marland Kitchen HYDROcodone-acetaminophen (VICODIN) 5-500 MG per tablet Take 1 tablet by mouth every 6 (six) hours as needed.    Marland Kitchen levothyroxine (SYNTHROID, LEVOTHROID) 75 MCG tablet TAKE ONE TABLET BY MOUTH ONE TIME  DAILY  . meloxicam (MOBIC) 7.5 MG tablet Take 1 tablet (7.5 mg total) by mouth as needed.  . METHOTREXATE, ANTI-RHEUMATIC, PO Take 1 mg by mouth once a week. Take 6 capsules once a week  . metroNIDAZOLE (METROGEL) 1 % gel Apply topically daily.    Marland Kitchen omeprazole (PRILOSEC) 20 MG capsule Take 20 mg by mouth daily.    . [DISCONTINUED] citalopram (CELEXA) 20 MG tablet TAKE ONE TABLET BY MOUTH ONE TIME DAILY   . albuterol (PROAIR HFA) 108 (90 BASE) MCG/ACT inhaler Inhale 2 puffs into the lungs every 4 (four) hours as needed for wheezing.  Marland Kitchen buPROPion (WELLBUTRIN XL) 150 MG 24 hr tablet Take 1 tablet (150 mg total) by mouth every morning.  . [DISCONTINUED] fluconazole (DIFLUCAN) 150 MG tablet Take 1 tablet (150 mg total) by mouth once.  . [DISCONTINUED] inFLIXimab (REMICADE) 100 MG injection Inject into the vein.  . [DISCONTINUED] prednisoLONE 5 MG TABS Take by mouth.  . [DISCONTINUED]  triamcinolone ointment (KENALOG) 0.5 % Apply topically 2 (two) times daily. For no more than 4-7 days at a time or until cleared to the affected area.          Objective:   Physical Exam  Constitutional: She is oriented to person, place, and time. She appears well-developed and well-nourished.  HENT:  Head: Normocephalic and atraumatic.  Right Ear: External ear normal.  Left Ear: External ear normal.  Nose: Nose normal.  Mouth/Throat: Oropharynx is clear and moist.  TMs and canals are clear.   Eyes: Conjunctivae and EOM are normal. Pupils are equal, round, and reactive to light.  Neck: Neck supple. No thyromegaly present.  Cardiovascular: Normal rate, regular rhythm and normal heart sounds.   Pulmonary/Chest: Effort normal and breath sounds normal. She has no wheezes.  Lymphadenopathy:    She has no cervical adenopathy.  Neurological: She is alert and oriented to person, place, and time.  Skin: Skin is warm and dry.  Psychiatric: She has a normal mood and affect.          Assessment & Plan:  Tinnitus - Normal ear exam . Will do tympanometry today for further evaluation.  Tympanometry showed widened peak. This is most consistent with oncoming or resolving effusion. I would like her to try an antihistamine with decongestant for the next 4-5 days. If at that point time she still having ringing then please call the office back and we will try a short course of steroids. If she starts having pain, hearing loss or change in symptoms they're encouraged her call sooner. No sign of actual infection today.  Depression - will inc citalopram to 40mg  daily. Will do EKG today to make sure that CT baseline is normal. Follow up in 6 weeks to see how she's doing on the increased dose. PHQ 9 score of 17 today.  EKG shows rate of 84 beats per minute, normal sinus rhythm with normal axis. No acute ST-T wave changes. No left atrial enlargement. QT is prolonged. Corrected QT is 460 ms. 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 I'm not sure if this is hereditary her genetic. Will actually go ahead and wean the citalopram.  Change to Wellbutrin. Warned about taking medications that increase QT interval.Repeat EKG at f/u to see if she may just genetically have prolonged QT interval.  Rheumatoid arthritis-seems to be coming down on her new regimen.  Time spent 30 minutes, greater than 50% of time spent counseling about tinnitus and mood.

## 2013-08-02 NOTE — Patient Instructions (Addendum)
Recommend start an antihistamine with decongestant such as Claritin-D or Allegra-D or Zyrtec-D. If by Monday you're not feeling significantly better then please call the office back and we will consider a trial of steroids to help with the tinnitus. If you feel like you're getting worse or spikes a fever or actually started about your pain then please call me sooner rather than later. The citalopram in half. Decrease to half a tab daily for 6 days, then half a tab every other day for 6 days.  Then stop. Can start wellbutrin in 6 days.       Long QT Syndrome Long QT syndrome is a disorder of the heart's electrical system. Long QT syndrome affects the process that allows the heart to recharge itself after each heartbeat (repolarization). In long QT syndrome, the heart takes longer to recharge, which can lead to:  A very fast heart rhythm (arrhythmia).  Fainting (syncope).  Sudden death. Long QT syndrome can be either acquired or present at birth (congenital). Congenital long QT syndrome is either associated with deafness at birth (Jervell and Lang-Nielsen syndrome), which is rare, or not associated with deafness (Romano-Ward syndrome), which is the most common type. RISK FACTORS  Deafness at birth.  Family history of experienced unexplained fainting or sudden cardiac death.  Use of certain medicines. CAUSES  Acquired long QT syndrome can be caused by abnormal electrolyte levels, such as low potassium levels and low magnesium levels. It can also be caused by the use of certain medicines. These medicines can include:  Antihistamines.  Antidepressants and psychotropic drugs.  Antiarrhythmics.  Antibiotics, antifungals, and antivirals.  Gastrointestinal medicines.  Diuretics.  High blood pressure medicines.  Cholesterol-lowering medicines.  Migraine medicines. DIAGNOSIS  Different kinds of tests can be used to diagnose long QT syndrome. These include:  Electrocardiography,  which records the heart's electrical activity.  Holter monitor, which records your heartbeat and can help diagnose heart arrhythmias.  Stress tests by exercise or by giving medicine that makes the heart beat faster.  Genetic tests. TREATMENT  Treatment of long QT syndrome may involve:  Stopping the use of medicines that may be the cause.  Correcting abnormal electrolyte levels.  Correcting abnormal thyroid levels.  Use of heart medicines such as beta blockers.  An implantable cardioverter-defibrillator. This is a device that can shock a fast heart rate into a normal heart rhythm. SEEK IMMEDIATE MEDICAL CARE IF:  You have chest pain that feels like squeezing or pressure.  You feel faint or like you are going to pass out.  You feel your heart racing or skipping beats.  You have shortness of breath. MAKE SURE YOU:   Understand these instructions.  Will watch your condition.  Will get help right away if you are not doing well or get worse. Document Released: 07/11/2009 Document Revised: 12/06/2011 Document Reviewed: 07/11/2009 [facility] Patient Information 2014 [place], [place].

## 2013-08-08 ENCOUNTER — Encounter: Payer: Self-pay | Admitting: Physician Assistant

## 2013-08-08 ENCOUNTER — Ambulatory Visit (INDEPENDENT_AMBULATORY_CARE_PROVIDER_SITE_OTHER): Payer: BC Managed Care – PPO | Admitting: Physician Assistant

## 2013-08-08 ENCOUNTER — Ambulatory Visit: Payer: BC Managed Care – PPO | Admitting: Physician Assistant

## 2013-08-08 VITALS — BP 124/79 | HR 112 | Temp 98.3°F | Wt 216.0 lb

## 2013-08-08 DIAGNOSIS — J209 Acute bronchitis, unspecified: Secondary | ICD-10-CM

## 2013-08-08 DIAGNOSIS — H9313 Tinnitus, bilateral: Secondary | ICD-10-CM

## 2013-08-08 DIAGNOSIS — H9319 Tinnitus, unspecified ear: Secondary | ICD-10-CM

## 2013-08-08 MED ORDER — DOXYCYCLINE HYCLATE 100 MG PO CAPS: 100.0000 mg | ORAL_CAPSULE | Freq: Two times a day (BID) | ORAL | Status: DC

## 2013-08-08 MED ORDER — FLUTICASONE PROPIONATE 50 MCG/ACT NA SUSP: 2.0000 | Freq: Every day | NASAL | Status: DC

## 2013-08-08 MED ORDER — HYDROCODONE-HOMATROPINE 5-1.5 MG/5ML PO SYRP: 5.0000 mL | ORAL_SOLUTION | Freq: Four times a day (QID) | ORAL | Status: DC | PRN

## 2013-08-08 NOTE — Patient Instructions (Signed)
Acute Bronchitis Bronchitis is inflammation of the airways that extend from the windpipe into the lungs (bronchi). The inflammation often causes mucus to develop. This leads to a cough, which is the most common symptom of bronchitis.  In acute bronchitis, the condition usually develops suddenly and goes away over time, usually in a couple weeks. Smoking, allergies, and asthma can make bronchitis worse. Repeated episodes of bronchitis may cause further lung problems.  CAUSES Acute bronchitis is most often caused by the same virus that causes a cold. The virus can spread from person to person (contagious).  SIGNS AND SYMPTOMS   Cough.   Fever.   Coughing up mucus.   Body aches.   Chest congestion.   Chills.   Shortness of breath.   Sore throat.  DIAGNOSIS  Acute bronchitis is usually diagnosed through a physical exam. Tests, such as chest X-rays, are sometimes done to rule out other conditions.  TREATMENT  Acute bronchitis usually goes away in a couple weeks. Often times, no medical treatment is necessary. Medicines are sometimes given for relief of fever or cough. Antibiotics are usually not needed but may be prescribed in certain situations. In some cases, an inhaler may be recommended to help reduce shortness of breath and control the cough. A cool mist vaporizer may also be used to help thin bronchial secretions and make it easier to clear the chest.  HOME CARE INSTRUCTIONS  Get plenty of rest.   Drink enough fluids to keep your urine clear or pale yellow (unless you have a medical condition that requires fluid restriction). Increasing fluids may help thin your secretions and will prevent dehydration.   Only take over-the-counter or prescription medicines as directed by your health care provider.   Avoid smoking and secondhand smoke. Exposure to cigarette smoke or irritating chemicals will make bronchitis worse. If you are a smoker, consider using nicotine gum or skin  patches to help control withdrawal symptoms. Quitting smoking will help your lungs heal faster.   Reduce the chances of another bout of acute bronchitis by washing your hands frequently, avoiding people with cold symptoms, and trying not to touch your hands to your mouth, nose, or eyes.   Follow up with your health care provider as directed.  SEEK MEDICAL CARE IF: Your symptoms do not improve after 1 week of treatment.  SEEK IMMEDIATE MEDICAL CARE IF:  You develop an increased fever or chills.   You have chest pain.   You have severe shortness of breath.  You have bloody sputum.   You develop dehydration.  You develop fainting.  You develop repeated vomiting.  You develop a severe headache. MAKE SURE YOU:   Understand these instructions.  Will watch your condition.  Will get help right away if you are not doing well or get worse. Document Released: 10/21/2004 Document Revised: 05/16/2013 Document Reviewed: 03/06/2013 [employer] Patient Information 2014 [employer].  

## 2013-08-08 NOTE — Progress Notes (Signed)
  Subjective:    Patient ID: Nancy Wang, female    DOB: 1970-11-01, 42 y.o.   MRN: [id number]  HPI Patient is a 42 yo WF who presents to the clinic with 2 days of coughing spells and some shortness of breath. Her cough is mostly dry but started to produce sputum this morning. She denies any fever, chills, n/v/d, sinus pressure. She does have some bilateral ear ringing but no pain. She is on MTX and considered immunosuppressed. She has a hx of pneumonia. She was recently dx with QT prolongation. She is tapering off celexa and adding wellbutrin. She has not used albuterol inhaler as of today. She has not tried anything OTC.     Review of Systems     Objective:   Physical Exam  Constitutional: She is oriented to person, place, and time. She appears well-developed and well-nourished.  HENT:  Head: Normocephalic and atraumatic.  Right Ear: External ear normal.  Left Ear: External ear normal.  Nose: Nose normal.  Mouth/Throat: Oropharynx is clear and moist.  TM's clear bilaterally.  Eyes: Conjunctivae are normal.  Neck: Normal range of motion. Neck supple.  Cardiovascular: Normal heart sounds.   Tachycardia at 112.  Pulmonary/Chest: Effort normal and breath sounds normal.  Dry hacking off in office today.  Lymphadenopathy:    She has no cervical adenopathy.  Neurological: She is alert and oriented to person, place, and time.  Skin:  Cheeks erythematous bilaterally.  Psychiatric: She has a normal mood and affect. Her behavior is normal.          Assessment & Plan:  Acute bronchitis/tinnitis- Peak flows were in the green. Pt consider immunosuppressed. Gave abx of doxy told to hold another 24-48 hours to see if would get better on her own with symptomatic care. Gave hycodan for cough at bedtime. Discussed delsym during the day. Flonase was given to help relieve some pressure on ears. No infection present today. Discussed use of albuterol if cough or SOB gets worse. Call if not  improving after infections resolves. Will hold on steroids today but will consider if not improving with abx.

## 2013-08-22 ENCOUNTER — Encounter: Payer: Self-pay | Admitting: Family Medicine

## 2013-08-31 ENCOUNTER — Ambulatory Visit (INDEPENDENT_AMBULATORY_CARE_PROVIDER_SITE_OTHER): Payer: BC Managed Care – PPO | Admitting: Family Medicine

## 2013-08-31 ENCOUNTER — Encounter: Payer: Self-pay | Admitting: Family Medicine

## 2013-08-31 VITALS — BP 128/80 | HR 113 | Temp 99.5°F | Ht 66.0 in | Wt 217.0 lb

## 2013-08-31 DIAGNOSIS — H9319 Tinnitus, unspecified ear: Secondary | ICD-10-CM

## 2013-08-31 DIAGNOSIS — G4700 Insomnia, unspecified: Secondary | ICD-10-CM

## 2013-08-31 DIAGNOSIS — F329 Major depressive disorder, single episode, unspecified: Secondary | ICD-10-CM

## 2013-08-31 DIAGNOSIS — F3289 Other specified depressive episodes: Secondary | ICD-10-CM

## 2013-08-31 DIAGNOSIS — R9431 Abnormal electrocardiogram [ECG] [EKG]: Secondary | ICD-10-CM

## 2013-08-31 MED ORDER — PREDNISONE 50 MG PO TABS: 50.0000 mg | ORAL_TABLET | Freq: Every day | ORAL | Status: DC

## 2013-08-31 MED ORDER — BUPROPION HCL ER (XL) 300 MG PO TB24: 300.0000 mg | ORAL_TABLET | ORAL | Status: DC

## 2013-08-31 MED ORDER — ZOLPIDEM TARTRATE 5 MG PO TABS: 5.0000 mg | ORAL_TABLET | Freq: Every evening | ORAL | Status: DC | PRN

## 2013-08-31 NOTE — Progress Notes (Addendum)
   Subjective:    Patient ID: Nancy Wang, female    DOB: April 21, 1971, 42 y.o.   MRN: [id number]  HPI Insomnia - x 2 months has gotten worse past 2 weeks. she stops caffiene drinks around 5 pm she gets in the bed around [phone] falls asleep around 11or 12 and was going thru a period when she was waking up 3-4x.    Depression  - Now off citalopram.  She has been on the Wellbutrin x 3 weeks. Has been a little more teaful as well.    Ear ringing stil persistant. Failed decongestant, antihistamine and antibiotic. It is constant. I was supposed to call if not better in one week.     QT prolongation-she gets it several members of the family to see if they had any heart problems. Interestingly she does have 2 relatives with palpitation issues. 1 actually had to have an ablation. Review of Systems     Objective:   Physical Exam  Constitutional: She is oriented to person, place, and time. She appears well-developed and well-nourished.  HENT:  Head: Normocephalic and atraumatic.  Right Ear: External ear normal.  Left Ear: External ear normal.  TMs and canals are clear bilaterally.  Eyes: Conjunctivae and EOM are normal.  Cardiovascular: Normal rate.   Pulmonary/Chest: Effort normal.  Neurological: She is alert and oriented to person, place, and time.  Skin: Skin is dry. No pallor.  Psychiatric: She has a normal mood and affect. Her behavior is normal.          Assessment & Plan:  Depression - will try increasing the Wellbutrin to 300 mg and see her back in one month. She feels that she's getting worse or having more insomnia on it and please let me know.  Insomnia-trial of Ambien as needed. Monitor for excess sedation and use sparingly. Not sure if the insomnia is related to coming off the citalopram or possibly related to the Wellbutrin itself. We will see if the next month if it improves on its own. If not then consider that could be coming from the Wellbutrin.  QT  prolongation. Repeat EKG at next OV.    Ear ringing - her ear still persistently ringing. I would like to try a five-day course of steroids and she has failed an antibiotic, antihistamine and decongestant. If the ringing persists at that point I recommend referral to ear nose and throat specialist. Sponge her that sometimes whenever really find a cause or treatment for this.

## 2013-09-10 ENCOUNTER — Ambulatory Visit (INDEPENDENT_AMBULATORY_CARE_PROVIDER_SITE_OTHER): Payer: BC Managed Care – PPO | Admitting: Family Medicine

## 2013-09-10 ENCOUNTER — Other Ambulatory Visit: Payer: Self-pay | Admitting: *Deleted

## 2013-09-10 ENCOUNTER — Encounter: Payer: Self-pay | Admitting: Family Medicine

## 2013-09-10 VITALS — BP 120/80 | HR 114 | Temp 97.9°F | Wt 215.0 lb

## 2013-09-10 DIAGNOSIS — H9319 Tinnitus, unspecified ear: Secondary | ICD-10-CM

## 2013-09-10 DIAGNOSIS — J329 Chronic sinusitis, unspecified: Secondary | ICD-10-CM

## 2013-09-10 DIAGNOSIS — B9689 Other specified bacterial agents as the cause of diseases classified elsewhere: Secondary | ICD-10-CM

## 2013-09-10 DIAGNOSIS — A499 Bacterial infection, unspecified: Secondary | ICD-10-CM

## 2013-09-10 DIAGNOSIS — H9312 Tinnitus, left ear: Secondary | ICD-10-CM

## 2013-09-10 MED ORDER — LEVOFLOXACIN 500 MG PO TABS: 500.0000 mg | ORAL_TABLET | Freq: Every day | ORAL | Status: DC

## 2013-09-10 NOTE — Progress Notes (Signed)
CC: Nancy Wang is a 42 y.o. female is here for Nasal Congestion and Cough   Subjective: HPI:  Complains of nasal congestion, facial pain, subjective postnasal drip, and nonproductive cough. All of which have been present for a little over a week worsening on a daily basis. When she was on prednisone for tinnitus symptoms did not get better or worse however they have been worsening since stopping prednisone. All symptoms above are moderate in severity worse first thing in the morning and in the evening hours. Has tried over-the-counter cough medicine without much benefit.  Denies fevers, chills, chest pain. Does endorse mild shortness of breath with activity and having to use albuterol more than she's used to.  Review Of Systems Outlined In HPI  Past Medical History  Diagnosis Date  . Thyroid disease     hypothyroidism  . GERD (gastroesophageal reflux disease)   . Pseudogout     Dr. Dareen Piano  . RA (rheumatoid arthritis)     Follow by Azzie Roup, MD - [facility]     Family History  Problem Relation Age of Onset  . Hypertension Mother   . Diabetes Mother   . Cancer Father     prostate  . Coronary artery disease Father   . Depression Father   . Diabetes      family history  . Hypertension      family history     History  Substance Use Topics  . Smoking status: Never Smoker   . Smokeless tobacco: Not on file  . Alcohol Use: No     Objective: Filed Vitals:   09/10/13 1056  BP: 120/80  Pulse: 114  Temp: 97.9 F (36.6 C)    General: Alert and Oriented, No Acute Distress HEENT: Pupils equal, round, reactive to light. Conjunctivae clear.  External ears unremarkable, canals clear with intact TMs with appropriate landmarks.  Bilateral middle ear with serous effusion. Pink inferior turbinates.  Moist mucous membranes, pharynx without inflammation nor lesions, cobblestoning is present.  Neck supple without palpable lymphadenopathy nor abnormal  masses. Lungs: Comfortable work of breathing no rhonchi or wheezing nor rales.  Cardiac: Regular rate and rhythm. Normal S1/S2.  No murmurs, rubs, nor gallops.   Mental Status: No depression, anxiety, nor agitation. Skin: Warm and dry.  Assessment & Plan: Anderson was seen today for nasal congestion and cough.  Diagnoses and associated orders for this visit:  Bacterial sinusitis - levofloxacin (LEVAQUIN) 500 MG tablet; Take 1 tablet (500 mg total) by mouth daily.  Tinnitus, left    bacterial sinusitis: Start levofloxacin consider nasal saline washes and Alka-Seltzer cold and sinus.   Tinnitus: Referral has already been placed by her PCP  Return if symptoms worsen or fail to improve.

## 2013-09-13 ENCOUNTER — Ambulatory Visit (INDEPENDENT_AMBULATORY_CARE_PROVIDER_SITE_OTHER): Payer: BC Managed Care – PPO

## 2013-09-13 ENCOUNTER — Other Ambulatory Visit: Payer: Self-pay | Admitting: Family Medicine

## 2013-09-13 ENCOUNTER — Ambulatory Visit (INDEPENDENT_AMBULATORY_CARE_PROVIDER_SITE_OTHER): Payer: BC Managed Care – PPO | Admitting: Family Medicine

## 2013-09-13 ENCOUNTER — Ambulatory Visit: Payer: BC Managed Care – PPO | Admitting: Family Medicine

## 2013-09-13 ENCOUNTER — Encounter: Payer: Self-pay | Admitting: Family Medicine

## 2013-09-13 VITALS — BP 140/85 | HR 100 | Temp 100.3°F | Wt 212.0 lb

## 2013-09-13 DIAGNOSIS — J019 Acute sinusitis, unspecified: Secondary | ICD-10-CM

## 2013-09-13 DIAGNOSIS — R059 Cough, unspecified: Secondary | ICD-10-CM

## 2013-09-13 DIAGNOSIS — R062 Wheezing: Secondary | ICD-10-CM

## 2013-09-13 DIAGNOSIS — R509 Fever, unspecified: Secondary | ICD-10-CM

## 2013-09-13 DIAGNOSIS — R0602 Shortness of breath: Secondary | ICD-10-CM

## 2013-09-13 DIAGNOSIS — R05 Cough: Secondary | ICD-10-CM

## 2013-09-13 DIAGNOSIS — J209 Acute bronchitis, unspecified: Secondary | ICD-10-CM

## 2013-09-13 MED ORDER — HYDROCODONE-HOMATROPINE 5-1.5 MG/5ML PO SYRP: 5.0000 mL | ORAL_SOLUTION | Freq: Every evening | ORAL | Status: DC | PRN

## 2013-09-13 MED ORDER — TRAZODONE HCL 50 MG PO TABS: 50.0000 mg | ORAL_TABLET | Freq: Every day | ORAL | Status: DC

## 2013-09-13 MED ORDER — PREDNISONE 50 MG PO TABS: 50.0000 mg | ORAL_TABLET | Freq: Every day | ORAL | Status: DC

## 2013-09-13 NOTE — Progress Notes (Signed)
   Subjective:    Patient ID: Nancy Wang, female    DOB: Apr 26, 1971, 42 y.o.   MRN: [id number]  HPI  Saw Dr. Ivan Anchors 4 days ago after having URI sxs x 1 week. She had congestion, cough and no fever. Started levaquin 4 days ago 4 th dose will be tonight.  Started having low grade fever after the antibiotic.  Gets SOB easily.  Mild ST. No ear pain. No GI upset.  Feels foggy headed.  Using Delsym OTC and not helping. Does have albuterol at home but hasn't really been using it.   Review of Systems     Objective:   Physical Exam  Constitutional: She is oriented to person, place, and time. She appears well-developed and well-nourished.  HENT:  Head: Normocephalic and atraumatic.  Right Ear: External ear normal.  Left Ear: External ear normal.  Nose: Nose normal.  Mouth/Throat: Oropharynx is clear and moist.  TMs and canals are clear.   Eyes: Conjunctivae and EOM are normal. Pupils are equal, round, and reactive to light.  Neck: Neck supple. No thyromegaly present.  Cardiovascular: Normal rate, regular rhythm and normal heart sounds.   Pulmonary/Chest: Effort normal. She has wheezes.  Very forceful cough that is spasmodic, diffuse wheezing, worse on the left.   Lymphadenopathy:    She has no cervical adenopathy.  Neurological: She is alert and oriented to person, place, and time.  Skin: Skin is warm and dry.  Psychiatric: She has a normal mood and affect.          Assessment & Plan:  Acute sinusitis/bronchitis-she has been on Levaquin for approximately 72 hours. She is not improving and in fact feels she's getting worse. She actually started running a fever after starting the Levaquin and has a low-grade temperature today. With wheezing on exam and decreased pulse ox 94% I'm concerned about possible pneumonia. We'll get chest x-ray today and call with results once available. Typically Levaquin does a good job in covering pneumonia so certainly this could be viral. Will also check  a CBC with differential. Also encouraged her to use her albuterol liberally since she does have wheezing on exam today. She says she has an inhaler at home. Continue symptomatic care. Continue Levaquin until she hears from me.

## 2013-09-14 LAB — CBC WITH DIFFERENTIAL/PLATELET
Basophils Absolute: 0 10*3/uL (ref 0.0–0.1)
Basophils Relative: 0 % (ref 0–1)
Eosinophils Absolute: 0.3 10*3/uL (ref 0.0–0.7)
Eosinophils Relative: 3 % (ref 0–5)
HCT: 41.6 % (ref 36.0–46.0)
Lymphs Abs: 2.1 10*3/uL (ref 0.7–4.0)
MCH: 31.2 pg (ref 26.0–34.0)
MCV: 90 fL (ref 78.0–100.0)
Monocytes Absolute: 1.4 10*3/uL — ABNORMAL HIGH (ref 0.1–1.0)
Platelets: 298 10*3/uL (ref 150–400)
RBC: 4.62 MIL/uL (ref 3.87–5.11)
RDW: 14 % (ref 11.5–15.5)

## 2013-09-28 ENCOUNTER — Ambulatory Visit (INDEPENDENT_AMBULATORY_CARE_PROVIDER_SITE_OTHER): Payer: BC Managed Care – PPO | Admitting: Family Medicine

## 2013-09-28 ENCOUNTER — Ambulatory Visit: Payer: BC Managed Care – PPO | Admitting: Family Medicine

## 2013-09-28 ENCOUNTER — Encounter: Payer: Self-pay | Admitting: Family Medicine

## 2013-09-28 VITALS — BP 112/73 | HR 112 | Temp 97.0°F | Wt 217.0 lb

## 2013-09-28 DIAGNOSIS — R9431 Abnormal electrocardiogram [ECG] [EKG]: Secondary | ICD-10-CM

## 2013-09-28 DIAGNOSIS — F329 Major depressive disorder, single episode, unspecified: Secondary | ICD-10-CM

## 2013-09-28 DIAGNOSIS — G4700 Insomnia, unspecified: Secondary | ICD-10-CM

## 2013-09-28 DIAGNOSIS — F32A Depression, unspecified: Secondary | ICD-10-CM

## 2013-09-28 DIAGNOSIS — F3289 Other specified depressive episodes: Secondary | ICD-10-CM

## 2013-09-28 MED ORDER — TRAZODONE HCL 150 MG PO TABS: 150.0000 mg | ORAL_TABLET | Freq: Every day | ORAL | Status: DC

## 2013-09-28 NOTE — Progress Notes (Signed)
   Subjective:    Patient ID: Nancy Wang, female    DOB: [birth year], 43 y.o.   MRN: [id number]  HPI Followup depression-increase Wellbutrin to 300 mg daily at last office visit.  C/O fatigue, and sleep is fair.  Says sometime the RA ause her to be fatigued.   Insomnia - says did try 3 tabs trazodone and worked much better. Only uses it prn since her husband works 3rd shift and she has a baby at home.   QT prolongation-plan to repeat EKG today now that she is off of citalopram.  No CP or SOB.    Review of Systems     Objective:   Physical Exam  Constitutional: She is oriented to person, place, and time. She appears well-developed and well-nourished.  HENT:  Head: Normocephalic and atraumatic.  Eyes: Conjunctivae and EOM are normal.  Cardiovascular: Normal rate.   Pulmonary/Chest: Effort normal.  Neurological: She is alert and oriented to person, place, and time.  Skin: Skin is dry. No pallor.  Psychiatric: She has a normal mood and affect. Her behavior is normal.          Assessment & Plan:  Depression-PHQ 9 score of 3.  Well controlled.  Continue current regimen.  F/U in 3-4 months.   QT prolongation-  EKG today shows rate of 105 bpm, NSR, no acute changes no QT prolongation. It was unclear whether not this was a side effect citalopram or possibly just her baseline. It looks like she has a negative EKG. Will avoid citalopram in the future.   Insmonia- Will change the trazodone to 150mg  tab and is working well.

## 2013-10-03 ENCOUNTER — Encounter: Payer: Self-pay | Admitting: *Deleted

## 2013-10-12 ENCOUNTER — Ambulatory Visit (INDEPENDENT_AMBULATORY_CARE_PROVIDER_SITE_OTHER): Payer: BC Managed Care – PPO | Admitting: Podiatrist

## 2013-10-12 ENCOUNTER — Encounter: Payer: Self-pay | Admitting: Podiatrist

## 2013-10-12 ENCOUNTER — Ambulatory Visit (INDEPENDENT_AMBULATORY_CARE_PROVIDER_SITE_OTHER): Payer: BC Managed Care – PPO

## 2013-10-12 VITALS — BP 104/57 | HR 90 | Resp 18

## 2013-10-12 DIAGNOSIS — D219 Benign neoplasm of connective and other soft tissue, unspecified: Secondary | ICD-10-CM

## 2013-10-12 DIAGNOSIS — M79609 Pain in unspecified limb: Secondary | ICD-10-CM

## 2013-10-12 DIAGNOSIS — D3610 Benign neoplasm of peripheral nerves and autonomic nervous system, unspecified: Secondary | ICD-10-CM

## 2013-10-12 NOTE — Progress Notes (Signed)
   Subjective:    Patient ID: Nancy Wang, female    DOB: Mar 02, 1971, 43 y.o.   MRN: [id number]  HPI My left foot has been bothering me since Sept 2014 and I do have Rheumatoid Arthritis and my doctor thought that might be it and goes numb and feels hot and feels like I am walking on glass and takes my breath away and hurts on bottom    Review of Systems  Constitutional: Negative.   HENT:       Sore throat and ringing in ears  Eyes: Negative.   Respiratory: Negative.   Cardiovascular: Negative.   Gastrointestinal: Negative.   Endocrine: Negative.   Genitourinary: Negative.   Musculoskeletal:       Joint pain  Skin: Negative.   Allergic/Immunologic: Positive for environmental allergies.  Neurological: Positive for numbness.  Hematological: Negative.   Psychiatric/Behavioral: Negative.        Objective:   Physical Exam GENERAL APPEARANCE: Alert, conversant. Appropriately groomed. No acute distress.  VASCULAR: Pedal pulses palpable and strong bilateral.  Capillary refill time is immediate to all digits,  Proximal to distal cooling it warm to warm.  Digital hair growth is present bilateral  NEUROLOGIC: neuroma type symptomatology elicited 3rd interspace of the left foot. Neurological examination otherwise normal with sensation is intact epicritically and protectively to 5.07 monofilament at 5/5 sites bilateral.  Light touch is intact bilateral, vibratory sensation intact bilateral, achilles tendon reflex is intact bilateral.  MUSCULOSKELETAL: acceptable muscle strength, tone and stability bilateral.  Intrinsic muscluature intact bilateral.  Rectus appearance of foot and digits noted bilateral.   DERMATOLOGIC: skin color, texture, and turger are within normal limits.  Some swelling third interspace of the left foot is also present.     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 The patient tolerated this well see her back in 2 weeks for recheck of the foot and decide if the neuroma is indeed present or this could be more due to rheumatoid arthritis changes. Patient will call if any problems or concerns arise.

## 2013-10-23 ENCOUNTER — Other Ambulatory Visit: Payer: Self-pay | Admitting: Family Medicine

## 2013-10-26 ENCOUNTER — Other Ambulatory Visit: Payer: Self-pay | Admitting: Family Medicine

## 2013-11-02 ENCOUNTER — Ambulatory Visit: Payer: BC Managed Care – PPO | Admitting: Podiatrist

## 2013-11-09 ENCOUNTER — Encounter: Payer: Self-pay | Admitting: Physician Assistant

## 2013-11-09 ENCOUNTER — Ambulatory Visit (INDEPENDENT_AMBULATORY_CARE_PROVIDER_SITE_OTHER): Payer: BC Managed Care – PPO | Admitting: Physician Assistant

## 2013-11-09 VITALS — BP 125/75 | HR 115 | Temp 99.4°F | Wt 205.0 lb

## 2013-11-09 DIAGNOSIS — J101 Influenza due to other identified influenza virus with other respiratory manifestations: Secondary | ICD-10-CM

## 2013-11-09 DIAGNOSIS — R509 Fever, unspecified: Secondary | ICD-10-CM

## 2013-11-09 DIAGNOSIS — R6883 Chills (without fever): Secondary | ICD-10-CM

## 2013-11-09 DIAGNOSIS — R059 Cough, unspecified: Secondary | ICD-10-CM

## 2013-11-09 DIAGNOSIS — M549 Dorsalgia, unspecified: Secondary | ICD-10-CM

## 2013-11-09 DIAGNOSIS — R05 Cough: Secondary | ICD-10-CM

## 2013-11-09 DIAGNOSIS — J111 Influenza due to unidentified influenza virus with other respiratory manifestations: Secondary | ICD-10-CM

## 2013-11-09 LAB — POCT INFLUENZA A/B
Influenza A, POC: POSITIVE
Influenza B, POC: NEGATIVE

## 2013-11-09 MED ORDER — HYDROCODONE-HOMATROPINE 5-1.5 MG/5ML PO SYRP: 5.0000 mL | ORAL_SOLUTION | Freq: Every evening | ORAL | Status: DC | PRN

## 2013-11-09 MED ORDER — OSELTAMIVIR PHOSPHATE 75 MG PO CAPS: 75.0000 mg | ORAL_CAPSULE | Freq: Two times a day (BID) | ORAL | Status: DC

## 2013-11-09 NOTE — Patient Instructions (Addendum)
Zyrtec D, Tamiflu, Hycodan for cough.   Influenza, Adult Influenza ("the flu") is a viral infection of the respiratory tract. It occurs more often in winter months because people spend more time in close contact with one another. Influenza can make you feel very sick. Influenza easily spreads from person to person (contagious). CAUSES  Influenza is caused by a virus that infects the respiratory tract. You can catch the virus by breathing in droplets from an infected person's cough or sneeze. You can also catch the virus by touching something that was recently contaminated with the virus and then touching your mouth, nose, or eyes. SYMPTOMS  Symptoms typically last 4 to 10 days and may include:  Fever.  Chills.  Headache, body aches, and muscle aches.  Sore throat.  Chest discomfort and cough.  Poor appetite.  Weakness or feeling tired.  Dizziness.  Nausea or vomiting. DIAGNOSIS  Diagnosis of influenza is often made based on your history and a physical exam. A nose or throat swab test can be done to confirm the diagnosis. RISKS AND COMPLICATIONS You may be at risk for a more severe case of influenza if you smoke cigarettes, have diabetes, have chronic heart disease (such as heart failure) or lung disease (such as asthma), or if you have a weakened immune system. Elderly people and pregnant women are also at risk for more serious infections. The most common complication of influenza is a lung infection (pneumonia). Sometimes, this complication can require emergency medical care and may be life-threatening. PREVENTION  An annual influenza vaccination (flu shot) is the best way to avoid getting influenza. An annual flu shot is now routinely recommended for all adults in the [place] TREATMENT  In mild cases, influenza goes away on its own. Treatment is directed at relieving symptoms. For more severe cases, your caregiver may prescribe antiviral medicines to shorten the sickness. Antibiotic  medicines are not effective, because the infection is caused by a virus, not by bacteria. HOME CARE INSTRUCTIONS  Only take over-the-counter or prescription medicines for pain, discomfort, or fever as directed by your caregiver.  Use a cool mist humidifier to make breathing easier.  Get plenty of rest until your temperature returns to normal. This usually takes 3 to 4 days.  Drink enough fluids to keep your urine clear or pale yellow.  Cover your mouth and nose when coughing or sneezing, and wash your hands well to avoid spreading the virus.  Stay home from work or school until your fever has been gone for at least 1 full day. SEEK MEDICAL CARE IF:   You have chest pain or a deep cough that worsens or produces more mucus.  You have nausea, vomiting, or diarrhea. SEEK IMMEDIATE MEDICAL CARE IF:   You have difficulty breathing, shortness of breath, or your skin or nails turn bluish.  You have severe neck pain or stiffness.  You have a severe headache, facial pain, or earache.  You have a worsening or recurring fever.  You have nausea or vomiting that cannot be controlled. MAKE SURE YOU:  Understand these instructions.  Will watch your condition.  Will get help right away if you are not doing well or get worse. Document Released: 09/10/2000 Document Revised: 03/14/2012 Document Reviewed: 12/13/2011 [facility] Patient Information 2014 [place], [place].

## 2013-11-09 NOTE — Progress Notes (Signed)
   Subjective:    Patient ID: Tysheena Ginzburg, female    DOB: Mar 04, 1971, 43 y.o.   MRN: [id number]  HPI Pt is a 43 yo female who presents to the clinic with flu like symptoms. She has had chills, body aches, cough, back pain for 2 days. Her cough is not productive. Denies any wheezing or SOB. Symptoms started with body aches and have continued to progress. She has vomited a couple of times and feels nauseated. Highest temperature was 100.8. No other sick contacts. She has been taking ibuprofen and tylenol but nothing else. She does have ST, ear pain, sinus pressure.    Review of Systems     Objective:   Physical Exam  Constitutional: She is oriented to person, place, and time. She appears well-developed and well-nourished.  HENT:  Head: Normocephalic and atraumatic.  Right Ear: External ear normal.  Left Ear: External ear normal.  Nose: Nose normal.  TM's clear bilaterally.   Oropharynx is erythematous with PND present.   Maxillary tenderness present to palpation.   Eyes: Conjunctivae are normal. Right eye exhibits no discharge. Left eye exhibits no discharge.  Neck: Normal range of motion. Neck supple.  Bilateral anterior cervical adenopathy.   Cardiovascular: Normal rate, regular rhythm and normal heart sounds.   Pulmonary/Chest: Effort normal and breath sounds normal. She has no wheezes.  No CVA tenderness.   Abdominal: Soft. Bowel sounds are normal. There is no tenderness.  Lymphadenopathy:    She has cervical adenopathy.  Neurological: She is alert and oriented to person, place, and time.  Skin: Skin is warm and dry.  Psychiatric: She has a normal mood and affect. Her behavior is normal.          Assessment & Plan:  Influenza- Influenza A was positive. Gave Tamiflu for 5 days. Discussed zyrtec D and gave hycodan for cough. Rest and hydration discussed. Gave tamiflu prophalxtic for husband. Mother and father were also given scrip since they are pt's and she has spent  a lot of time with them. Gave rx to pt if mother and father experience symptoms.

## 2013-12-24 ENCOUNTER — Encounter: Payer: Self-pay | Admitting: Family Medicine

## 2013-12-24 ENCOUNTER — Other Ambulatory Visit (HOSPITAL_COMMUNITY)
Admission: RE | Admit: 2013-12-24 | Discharge: 2013-12-24 | Disposition: A | Discharge status: Home / Self Care | Payer: BC Managed Care – PPO | Source: Ambulatory Visit | Attending: Family Medicine | Admitting: Family Medicine

## 2013-12-24 ENCOUNTER — Ambulatory Visit (INDEPENDENT_AMBULATORY_CARE_PROVIDER_SITE_OTHER): Payer: BC Managed Care – PPO | Admitting: Family Medicine

## 2013-12-24 VITALS — BP 111/65 | HR 90 | Temp 98.1°F | Ht 66.0 in | Wt 202.0 lb

## 2013-12-24 DIAGNOSIS — R300 Dysuria: Secondary | ICD-10-CM

## 2013-12-24 DIAGNOSIS — Z124 Encounter for screening for malignant neoplasm of cervix: Secondary | ICD-10-CM

## 2013-12-24 DIAGNOSIS — Z1151 Encounter for screening for human papillomavirus (HPV): Secondary | ICD-10-CM | POA: Insufficient documentation

## 2013-12-24 DIAGNOSIS — Z0000 Encounter for general adult medical examination without abnormal findings: Secondary | ICD-10-CM

## 2013-12-24 DIAGNOSIS — Z1231 Encounter for screening mammogram for malignant neoplasm of breast: Secondary | ICD-10-CM

## 2013-12-24 DIAGNOSIS — E039 Hypothyroidism, unspecified: Secondary | ICD-10-CM

## 2013-12-24 LAB — COMPLETE METABOLIC PANEL WITH GFR
ALBUMIN: 3.9 g/dL (ref 3.5–5.2)
ALK PHOS: 74 U/L (ref 39–117)
ALT: 28 U/L (ref 0–35)
AST: 17 U/L (ref 0–37)
BUN: 10 mg/dL (ref 6–23)
CO2: 28 mEq/L (ref 19–32)
Calcium: 9.3 mg/dL (ref 8.4–10.5)
Chloride: 101 mEq/L (ref 96–112)
Creat: 0.73 mg/dL (ref 0.50–1.10)
GFR, Est African American: >89 mL/min
GLUCOSE: 83 mg/dL (ref 70–99)
Potassium: 4.3 mEq/L (ref 3.5–5.3)
Sodium: 135 mEq/L (ref 135–145)
Total Bilirubin: 1 mg/dL (ref 0.2–1.2)
Total Protein: 6.6 g/dL (ref 6.0–8.3)

## 2013-12-24 LAB — POCT URINALYSIS DIPSTICK
Bilirubin, UA: NEGATIVE
Blood, UA: NEGATIVE
Glucose, UA: NEGATIVE
Ketones, UA: NEGATIVE
Leukocytes, UA: NEGATIVE
Nitrite, UA: NEGATIVE
Protein, UA: NEGATIVE
Spec Grav, UA: >=1.03
Urobilinogen, UA: 0.2
pH, UA: 5

## 2013-12-24 LAB — LIPID PANEL
CHOLESTEROL: 170 mg/dL (ref 0–200)
HDL: 42 mg/dL (ref >39)
LDL Cholesterol: 105 mg/dL — ABNORMAL HIGH (ref 0–99)
Total CHOL/HDL Ratio: 4 Ratio
Triglycerides: 113 mg/dL (ref <150)
VLDL: 23 mg/dL (ref 0–40)

## 2013-12-24 MED ORDER — EPINEPHRINE 0.3 MG/0.3ML IJ SOAJ: 0.3000 mg | Freq: Once | INTRAMUSCULAR | Status: DC

## 2013-12-24 NOTE — Addendum Note (Signed)
Addended by: Teddy Spike on: 12/24/2013 04:48 PM   Modules accepted: Orders

## 2013-12-24 NOTE — Patient Instructions (Signed)
Keep up a regular exercise program and make sure you are eating a healthy diet Try to eat 4 servings of dairy a day, or if you are lactose intolerant take a calcium with vitamin D daily.  Your vaccines are up to date.   

## 2013-12-24 NOTE — Progress Notes (Signed)
  Subjective:     Nancy Wang is a 43 y.o. female and is here for a comprehensive physical exam. The patient reports no problems. No specific problems or concerns except she feels that she may be going through perimenopause and like her hormones tested.  History   Social History  . Marital Status: Married    Spouse Name: N/A    Number of Children: N/A  . Years of Education: N/A   Occupational History  . Not on file.   Social History Main Topics  . Smoking status: Never Smoker   . Smokeless tobacco: Not on file  . Alcohol Use: No  . Drug Use: No  . Sexual Activity:      Comment: married, no regular exercise, limited by knee pain, has son.    Other Topics Concern  . Not on file   Social History Narrative  . No narrative on file   Health Maintenance  Topic Date Due  . Pap Smear  12/13/1988  . Influenza Vaccine  04/27/2014  . Tetanus/tdap  08/03/2023    The following portions of the patient's history were reviewed and updated as appropriate: allergies, current medications, past family history, past medical history, past social history, past surgical history and problem list.  Review of Systems A comprehensive review of systems was negative.   Objective:    There were no vitals taken for this visit. General appearance: alert, cooperative and appears stated age Head: Normocephalic, without obvious abnormality, atraumatic Eyes: conj clear, EOMi PEERLA Ears: normal TM's and external ear canals both ears Nose: Nares normal. Septum midline. Mucosa normal. No drainage or sinus tenderness. Throat: lips, mucosa, and tongue normal; teeth and gums normal Neck: no adenopathy, no carotid bruit, no JVD, supple, symmetrical, trachea midline and thyroid not enlarged, symmetric, no tenderness/mass/nodules Back: symmetric, no curvature. ROM normal. No CVA tenderness. Lungs: clear to auscultation bilaterally Breasts: normal appearance, no masses or tenderness Heart: regular rate  and rhythm, S1, S2 normal, no murmur, click, rub or gallop Abdomen: soft, non-tender; bowel sounds normal; no masses,  no organomegaly Pelvic: cervix normal in appearance, external genitalia normal, no adnexal masses or tenderness, no cervical motion tenderness, rectovaginal septum normal, uterus normal size, shape, and consistency and vagina normal without discharge Extremities: extremities normal, atraumatic, no cyanosis or edema Pulses: 2+ and symmetric Skin: Skin color, texture, turgor normal. No rashes or lesions Lymph nodes: Cervical, supraclavicular, and axillary nodes normal. Neurologic: Alert and oriented X 3, normal strength and tone. Normal symmetric reflexes. Normal coordination and gait    Assessment:    Healthy female exam.      Plan:     See After Visit Summary for Counseling Recommendations  Keep up a regular exercise program and make sure you are eating a healthy diet Try to eat 4 servings of dairy a day, or if you are lactose intolerant take a calcium with vitamin D daily.  Your vaccines are up to date.  Due for CMP and fasting lipid panel. Pap smear performed today. We'll call with results in one week. Overdue for mammogram. Will place a referral to contact her.  Urinary urgency x1 day-no dysuria. No hematuria. No fevers chills or sweats or back pain with that. We'll perform urinalysis today.  She think she may be going through perimenopause. She would like to have her hormones tested.  Hypogonadism-due to recheck TSH today.

## 2013-12-25 LAB — LUTEINIZING HORMONE: LH: 4.8 m[IU]/mL

## 2013-12-25 LAB — FOLLICLE STIMULATING HORMONE: FSH: 4.9 m[IU]/mL

## 2013-12-25 LAB — TSH: TSH: 3.051 u[IU]/mL (ref 0.350–4.500)

## 2013-12-25 LAB — PROGESTERONE: Progesterone: 3.3 ng/mL

## 2013-12-25 LAB — ESTRADIOL: ESTRADIOL: 151.8 pg/mL

## 2013-12-27 NOTE — Progress Notes (Signed)
Quick Note:  Call patient: Your Pap smear is normal. Repeat in 2-3 years. ______ 

## 2014-01-01 ENCOUNTER — Ambulatory Visit: Payer: BC Managed Care – PPO

## 2014-01-03 ENCOUNTER — Ambulatory Visit (INDEPENDENT_AMBULATORY_CARE_PROVIDER_SITE_OTHER): Payer: BC Managed Care – PPO

## 2014-01-03 DIAGNOSIS — Z1231 Encounter for screening mammogram for malignant neoplasm of breast: Secondary | ICD-10-CM

## 2014-01-19 ENCOUNTER — Emergency Department
Admission: EM | Admit: 2014-01-19 | Discharge: 2014-01-19 | Disposition: A | Discharge status: Home / Self Care | Payer: BC Managed Care – PPO | Source: Home / Self Care | Attending: Family Medicine | Admitting: Family Medicine

## 2014-01-19 ENCOUNTER — Encounter: Payer: Self-pay | Admitting: Emergency Medicine

## 2014-01-19 DIAGNOSIS — J069 Acute upper respiratory infection, unspecified: Secondary | ICD-10-CM

## 2014-01-19 DIAGNOSIS — B9789 Other viral agents as the cause of diseases classified elsewhere: Principal | ICD-10-CM

## 2014-01-19 HISTORY — DX: Bronchitis, not specified as acute or chronic: J40

## 2014-01-19 MED ORDER — BENZONATATE 200 MG PO CAPS: 200.0000 mg | ORAL_CAPSULE | Freq: Every day | ORAL | Status: DC

## 2014-01-19 MED ORDER — PREDNISONE 20 MG PO TABS: 20.0000 mg | ORAL_TABLET | Freq: Two times a day (BID) | ORAL | Status: DC

## 2014-01-19 MED ORDER — AZITHROMYCIN 250 MG PO TABS: ORAL_TABLET | ORAL | Status: DC

## 2014-01-19 NOTE — ED Provider Notes (Signed)
CSN: [id number]     Arrival date & time 01/19/14  1451 History   None    Chief Complaint  Patient presents with  . Cough  . Nasal Congestion      HPI Comments: Two days ago patient developed fatigue, sore throat, nasal congestion and sneezing.  Yesterday she developed a cough and mild shortness of breath at times.  She has a past history of exercise asthma.  Her rheumatoid arthritis is treated with methotrexate and Golimumab.  The history is provided by the patient.    Past Medical History  Diagnosis Date  . Thyroid disease     hypothyroidism  . GERD (gastroesophageal reflux disease)   . Pseudogout     Dr. Ouida Sills  . RA (rheumatoid arthritis)     Follow by Tobie Lords, MD - [facility]  . Bronchitis    Past Surgical History  Procedure Laterality Date  . Exploratory lap  1985, 2004    for ovarian cysts, infertility  . Arthroscopic surgery      both knees, RT 1985 removed patella, LT 1988  . Essure tubal ligation    . Abdominal surgery     Family History  Problem Relation Age of Onset  . Hypertension Mother   . Diabetes Mother   . Cancer Father     prostate  . Coronary artery disease Father   . Depression Father   . Diabetes      family history  . Hypertension      family history   History  Substance Use Topics  . Smoking status: Never Smoker   . Smokeless tobacco: Not on file  . Alcohol Use: No   OB History   Grav Para Term Preterm Abortions TAB SAB Ect Mult Living                 Review of Systems + sore throat + cough + sneezing No pleuritic pain No wheezing + nasal congestion + post-nasal drainage No sinus pain/pressure No itchy/red eyes ? earache No hemoptysis + SOB No fever/chills + nausea No vomiting No abdominal pain No diarrhea No urinary symptoms No skin rash + fatigue No myalgias No headache Used OTC meds without relief  Allergies  Citalopram and Morphine sulfate  Home Medications   Prior to Admission  medications   Medication Sig Start Date End Date Taking? Authorizing Provider  albuterol (PROAIR HFA) 108 (90 BASE) MCG/ACT inhaler Inhale 2 puffs into the lungs every 4 (four) hours as needed for wheezing. 01/12/12 09/10/13  Jade L Breeback, PA-C  buPROPion (WELLBUTRIN XL) 300 MG 24 hr tablet TAKE ONE TABLET BY MOUTH EVERY MORNING  10/26/13   Hali Marry, MD  citalopram (CELEXA) 40 MG tablet  08/02/13   Historical Provider, MD  EPINEPHrine (EPIPEN 2-PAK) 0.3 mg/0.3 mL SOAJ injection Inject 0.3 mLs (0.3 mg total) into the muscle once. 12/24/13   Hali Marry, MD  fluticasone (FLONASE) 50 MCG/ACT nasal spray Place 2 sprays into both nostrils daily. 08/08/13   Jade L Breeback, PA-C  folic acid (FOLVITE) 270 MCG tablet Take 400 mcg by mouth daily.      Historical Provider, MD  Golimumab ([place] ARIA IV) Inject into the vein.    Historical Provider, MD  HYDROcodone-acetaminophen (VICODIN) 5-500 MG per tablet Take 1 tablet by mouth every 6 (six) hours as needed.      Historical Provider, MD  levothyroxine (SYNTHROID, LEVOTHROID) 75 MCG tablet TAKE ONE TABLET BY MOUTH ONE TIME DAILY  10/23/13   Hali Marry, MD  meloxicam (MOBIC) 7.5 MG tablet Take 1 tablet (7.5 mg total) by mouth as needed. 01/08/13   Jade L Breeback, PA-C  METHOTREXATE, ANTI-RHEUMATIC, PO Take 1 mg by mouth once a week. Take 6 capsules once a week    Historical Provider, MD  omeprazole (PRILOSEC) 20 MG capsule Take 20 mg by mouth daily.      Historical Provider, MD  traZODone (DESYREL) 150 MG tablet Take 1 tablet (150 mg total) by mouth at bedtime. 09/28/13   Hali Marry, MD  zolpidem (AMBIEN) 5 MG tablet  08/31/13   Historical Provider, MD   BP 130/85  Pulse 117  Temp(Src) 98.8 F (37.1 C) (Oral)  Resp 16  Ht 5\' 6"  (1.676 m)  Wt 197 lb (89.359 kg)  BMI 31.81 kg/m2  SpO2 97%  LMP 01/05/2014 Physical Exam Nursing notes and Vital Signs reviewed. Appearance:  Patient appears stated age, and in no acute  distress.  Patient is obese (BMI 31.8) Eyes:  Pupils are equal, round, and reactive to light and accomodation.  Extraocular movement is intact.  Conjunctivae are not inflamed  Ears:  Canals normal.  Tympanic membranes normal.  Nose:  Mildly congested turbinates.  No sinus tenderness.  Pharynx: Normal Neck:  Supple.   Enlarged posterior nodes are palpated bilaterally, with tenderness on the left.  Lungs:  Clear to auscultation.  Breath sounds are equal.  Heart:  Regular rate and rhythm without murmurs, rubs, or gallops.  Abdomen:  Nontender without masses or hepatosplenomegaly.  Bowel sounds are present.  No CVA or flank tenderness.  Extremities:  No edema.  No calf tenderness Skin:  No rash present.   ED Course  Procedures  none      MDM   1. Viral URI with cough; note history of exercise asthma and rheumatoid arthritis   Begin prednisone burst.  Z-pack.  Prescription written for Benzonatate [facility]) to take at bedtime for night-time cough.  Take plain Mucinex (1200 mg guaifenesin) twice daily for cough and congestion.  May add Sudafed for sinus congestion.   Increase fluid intake, rest. May use Afrin nasal spray (or generic oxymetazoline) twice daily for about 5 days.  Also recommend using saline nasal spray several times daily and saline nasal irrigation (AYR is a common brand).  Resume Flonase spray and use after Afrin spray and saline spray. Try warm salt water gargles for sore throat.  Continue albuterol inhaler as needed. Stop all antihistamines for now, and other non-prescription cough/cold preparations.  Follow-up with family doctor if not improving 7 to 10 days.     Kandra Nicolas, MD 01/29/14 1034

## 2014-01-19 NOTE — ED Notes (Signed)
Reports cough and congestion started 2 days ago; concerned due to hx of RA and bronchitis. Husband has had pneumonia recently.

## 2014-01-19 NOTE — Discharge Instructions (Signed)
Take plain Mucinex (1200 mg guaifenesin) twice daily for cough and congestion.  May add Sudafed for sinus congestion.   Increase fluid intake, rest. May use Afrin nasal spray (or generic oxymetazoline) twice daily for about 5 days.  Also recommend using saline nasal spray several times daily and saline nasal irrigation (AYR is a common brand).  Resume Flonase spray and use after Afrin spray and saline spray. Try warm salt water gargles for sore throat.  Continue albuterol inhaler as needed. Stop all antihistamines for now, and other non-prescription cough/cold preparations.  Follow-up with family doctor if not improving 7 to 10 days.

## 2014-04-30 ENCOUNTER — Ambulatory Visit (INDEPENDENT_AMBULATORY_CARE_PROVIDER_SITE_OTHER): Payer: BC Managed Care – PPO | Admitting: Sports Medicine

## 2014-04-30 ENCOUNTER — Ambulatory Visit (INDEPENDENT_AMBULATORY_CARE_PROVIDER_SITE_OTHER): Payer: BC Managed Care – PPO

## 2014-04-30 ENCOUNTER — Encounter: Payer: Self-pay | Admitting: Sports Medicine

## 2014-04-30 VITALS — BP 131/86 | HR 110 | Temp 100.2°F | Ht 66.0 in | Wt 194.0 lb

## 2014-04-30 DIAGNOSIS — J209 Acute bronchitis, unspecified: Secondary | ICD-10-CM | POA: Insufficient documentation

## 2014-04-30 DIAGNOSIS — R059 Cough, unspecified: Secondary | ICD-10-CM

## 2014-04-30 DIAGNOSIS — R05 Cough: Secondary | ICD-10-CM

## 2014-04-30 MED ORDER — HYDROCOD POLST-CHLORPHEN POLST 10-8 MG/5ML PO LQCR: 5.0000 mL | Freq: Two times a day (BID) | ORAL | Status: DC | PRN

## 2014-04-30 MED ORDER — AZITHROMYCIN 250 MG PO TABS: ORAL_TABLET | ORAL | Status: DC

## 2014-04-30 NOTE — Progress Notes (Signed)
  Subjective:    CC: I think I have bronchitis  HPI: Patient is a 43 year old woman with prior history of rheumatoid for which she is receiving immunosuppressant therapy, and recurrent bronchial infections who presents to the clinic because she developed a cough and feeling of fullness and tightness in her throat. The cough is dry and non-productive. She also admits to fever and chills, and has had some mild rhinorrhea. She denies sick contacts, mucus, ear/head pain, and muscle aches. Some chest pain with coughing. Symptoms are moderate, persistent.  Past medical history, Surgical history, Family history not pertinant except as noted below, Social history, Allergies, and medications have been entered into the medical record, reviewed, and no changes needed.   Objective:    General: Well Developed, well nourished, and in no acute distress.  Neuro: Alert and oriented x3, extra-ocular muscles intact, sensation grossly intact.  HEENT: Normocephalic, atraumatic, pupils equal round reactive to light, neck supple, no masses, no lymphadenopathy, thyroid nonpalpable.  Skin: Warm and dry, no rashes. Cardiac: Regular rate and rhythm, no murmurs rubs or gallops, no lower extremity edema.  Respiratory: Crackles in both lung bases, dry barking cough.   Chest x-ray is clear.  Impression and Recommendations:    Patient is an immunosuppressed 43 year old woman who presents with cough, chest pain, fever, and chills, and has crackles at both lung bases. We suspect pneumonia, and will be treating agressively due to her immunosuppression, and starting her on Azithromycin. We are also ordering a CXR and CBC to confirm pneumonia.

## 2014-04-30 NOTE — Assessment & Plan Note (Signed)
We will treat aggressively in this patient on immunosuppressants. Azithromycin, chest x-ray, and Tussionex. Return if no better in 2 weeks.

## 2014-05-01 LAB — CBC WITH DIFFERENTIAL/PLATELET
Basophils Absolute: 0.1 10*3/uL (ref 0.0–0.1)
Basophils Relative: 1 % (ref 0–1)
Eosinophils Absolute: 0.1 K/uL (ref 0.0–0.7)
Eosinophils Relative: 1 % (ref 0–5)
HCT: 44 % (ref 36.0–46.0)
Hemoglobin: 15.1 g/dL — ABNORMAL HIGH (ref 12.0–15.0)
Lymphocytes Relative: 33 % (ref 12–46)
Lymphs Abs: 3.8 K/uL (ref 0.7–4.0)
MCH: 32.3 pg (ref 26.0–34.0)
MCHC: 34.3 g/dL (ref 30.0–36.0)
MCV: 94 fL (ref 78.0–100.0)
Monocytes Absolute: 1.2 K/uL — ABNORMAL HIGH (ref 0.1–1.0)
Monocytes Relative: 10 % (ref 3–12)
Neutro Abs: 6.3 10*3/uL (ref 1.7–7.7)
Neutrophils Relative %: 55 % (ref 43–77)
Platelets: 352 10*3/uL (ref 150–400)
RBC: 4.68 MIL/uL (ref 3.87–5.11)
RDW: 13.4 % (ref 11.5–15.5)
WBC: 11.5 K/uL — ABNORMAL HIGH (ref 4.0–10.5)

## 2014-05-02 ENCOUNTER — Other Ambulatory Visit: Payer: Self-pay | Admitting: Family Medicine

## 2014-05-14 ENCOUNTER — Ambulatory Visit: Payer: BC Managed Care – PPO | Admitting: Family Medicine

## 2014-06-01 ENCOUNTER — Other Ambulatory Visit: Payer: Self-pay | Admitting: Family Medicine

## 2014-06-07 ENCOUNTER — Encounter: Payer: Self-pay | Admitting: Family Medicine

## 2014-06-07 ENCOUNTER — Ambulatory Visit (INDEPENDENT_AMBULATORY_CARE_PROVIDER_SITE_OTHER): Payer: BC Managed Care – PPO | Admitting: Family Medicine

## 2014-06-07 VITALS — BP 124/81 | HR 84 | Wt 194.0 lb

## 2014-06-07 DIAGNOSIS — G4700 Insomnia, unspecified: Secondary | ICD-10-CM

## 2014-06-07 DIAGNOSIS — F3289 Other specified depressive episodes: Secondary | ICD-10-CM

## 2014-06-07 DIAGNOSIS — L909 Atrophic disorder of skin, unspecified: Secondary | ICD-10-CM

## 2014-06-07 DIAGNOSIS — L919 Hypertrophic disorder of the skin, unspecified: Secondary | ICD-10-CM

## 2014-06-07 DIAGNOSIS — M069 Rheumatoid arthritis, unspecified: Secondary | ICD-10-CM | POA: Diagnosis not present

## 2014-06-07 DIAGNOSIS — E039 Hypothyroidism, unspecified: Secondary | ICD-10-CM | POA: Diagnosis not present

## 2014-06-07 DIAGNOSIS — Z23 Encounter for immunization: Secondary | ICD-10-CM | POA: Diagnosis not present

## 2014-06-07 DIAGNOSIS — F329 Major depressive disorder, single episode, unspecified: Secondary | ICD-10-CM

## 2014-06-07 DIAGNOSIS — L918 Other hypertrophic disorders of the skin: Secondary | ICD-10-CM

## 2014-06-07 NOTE — Assessment & Plan Note (Signed)
Currently on simponi aria. She's actually been doing very well on this regimen and has been on it for almost 8 months.

## 2014-06-07 NOTE — Assessment & Plan Note (Signed)
We'll recheck thyroid level today. Normally her TSH runs between 1 and 2 but at her last visit 6 months ago it was around 3. She's not been symptomatic but I think it would warrant testing again instead of waiting a year.

## 2014-06-07 NOTE — Progress Notes (Signed)
   Subjective:    Patient ID: Nancy Wang, female    DOB: 06-13-1971, 43 y.o.   MRN: [id number]  HPI Followup depression-she's currently taking citalopram and Wellbutrin, and doing well without any problems or side effects. Her last PHQ 9 was 3.  Insomnia- using her trazodone as needed.  Her husband  Works night shift so only takes it if he is home.    Hypothyroid - NO recent changes in skin, hair or energy. Has actulaly lost some weight.   Lab Results  Component Value Date   TSH 3.051 12/24/2013     Review of Systems     Objective:   Physical Exam  Constitutional: She is oriented to person, place, and time. She appears well-developed and well-nourished.  HENT:  Head: Normocephalic and atraumatic.  Neck: Neck supple. No thyromegaly present.  Cardiovascular: Normal rate, regular rhythm and normal heart sounds.   Pulmonary/Chest: Effort normal and breath sounds normal.  Lymphadenopathy:    She has no cervical adenopathy.  Neurological: She is alert and oriented to person, place, and time.  Skin: Skin is warm and dry.  Large skin tag at right waist line above hip posteriorly.   Psychiatric: She has a normal mood and affect. Her behavior is normal.          Assessment & Plan:  Flu vaccine given today.    Skin Tag Removal Procedure Note  Pre-operative Diagnosis: Classic skin tags (acrochordon)  Post-operative Diagnosis: Classic skin tags (acrochordon)  Locations:right posterior waist area  Indications: irritation  Anesthesia: Lidocaine 1% with epinephrine    Procedure Details  The risks (including bleeding and infection) and benefits of the procedure and Verbal informed consent obtained. Using sterile iris scissors, multiple skin tags were snipped off at their bases after cleansing with Betadine.  Bleeding was controlled by pressure.   Findings: Pathognomonic benign lesions  not sent for pathological  exam.  Condition: Stable  Complications: none.  Plan: 1. Instructed to keep the wounds dry and covered for 24-48h and clean thereafter.  No alcohol or peroxide products. 2. Warning signs of infection were reviewed.   3. Recommended that the patient use OTC acetaminophen as needed for pain.  4. Return as needed.

## 2014-06-07 NOTE — Assessment & Plan Note (Signed)
Under fair control. She only uses the trazodone when necessary. She says most of time it works but sometimes it does not that she's happy with her current regimen.

## 2014-06-07 NOTE — Assessment & Plan Note (Signed)
Well-controlled on current regimen. Followup in 6 months. Continue Wellbutrin. PHQ 9 score of 2 today which is fantastic.

## 2014-06-08 LAB — TSH: TSH: 3.525 u[IU]/mL (ref 0.350–4.500)

## 2014-08-05 ENCOUNTER — Encounter: Payer: Self-pay | Admitting: *Deleted

## 2014-08-05 ENCOUNTER — Emergency Department
Admission: EM | Admit: 2014-08-05 | Discharge: 2014-08-05 | Disposition: A | Discharge status: Home / Self Care | Payer: BC Managed Care – PPO | Source: Home / Self Care | Attending: Emergency Medicine | Admitting: Emergency Medicine

## 2014-08-05 ENCOUNTER — Emergency Department (INDEPENDENT_AMBULATORY_CARE_PROVIDER_SITE_OTHER): Payer: BC Managed Care – PPO

## 2014-08-05 DIAGNOSIS — R05 Cough: Secondary | ICD-10-CM

## 2014-08-05 DIAGNOSIS — J208 Acute bronchitis due to other specified organisms: Secondary | ICD-10-CM

## 2014-08-05 DIAGNOSIS — R059 Cough, unspecified: Secondary | ICD-10-CM

## 2014-08-05 MED ORDER — AZITHROMYCIN 250 MG PO TABS: ORAL_TABLET | ORAL | Status: DC

## 2014-08-05 MED ORDER — HYDROCOD POLST-CHLORPHEN POLST 10-8 MG/5ML PO LQCR: 5.0000 mL | Freq: Two times a day (BID) | ORAL | Status: DC

## 2014-08-05 NOTE — Discharge Instructions (Signed)

## 2014-08-05 NOTE — ED Notes (Signed)
Nancy Wang c/o dry cough, productive at times, since yesterday. Denies fever.

## 2014-08-05 NOTE — ED Provider Notes (Signed)
CSN: [id number]     Arrival date & time 08/05/14  1001 History   First MD Initiated Contact with Patient 08/05/14 1029     Chief Complaint  Patient presents with  . Cough   (Consider location/radiation/quality/duration/timing/severity/associated sxs/prior Treatment) Patient is a 43 y.o. female presenting with cough. The history is provided by the patient. No language interpreter was used.  Cough Cough characteristics:  Productive Sputum characteristics:  Nondescript Severity:  Moderate Onset quality:  Gradual Timing:  Constant Progression:  Worsening Chronicity:  New Smoker: no   Context: upper respiratory infection   Relieved by:  Nothing Worsened by:  Nothing tried Ineffective treatments:  None tried Associated symptoms: headaches, shortness of breath, sinus congestion and sore throat   Risk factors: no recent infection     Past Medical History  Diagnosis Date  . Thyroid disease     hypothyroidism  . GERD (gastroesophageal reflux disease)   . Pseudogout     Dr. Ouida Sills  . RA (rheumatoid arthritis)     Follow by Tobie Lords, MD - [facility]  . Bronchitis    Past Surgical History  Procedure Laterality Date  . Exploratory lap  1985, 2004    for ovarian cysts, infertility  . Arthroscopic surgery      both knees, RT 1985 removed patella, LT 1988  . Essure tubal ligation    . Abdominal surgery     Family History  Problem Relation Age of Onset  . Hypertension Mother   . Diabetes Mother   . Cancer Father     prostate  . Coronary artery disease Father   . Depression Father   . Diabetes      family history  . Hypertension      family history   History  Substance Use Topics  . Smoking status: Never Smoker   . Smokeless tobacco: Never Used  . Alcohol Use: No   OB History    No data available     Review of Systems  HENT: Positive for sore throat.   Respiratory: Positive for cough and shortness of breath.   Neurological: Positive for  headaches.  All other systems reviewed and are negative.   Allergies  Citalopram and Morphine sulfate  Home Medications   Prior to Admission medications   Medication Sig Start Date End Date Taking? Authorizing Provider  albuterol (PROAIR HFA) 108 (90 BASE) MCG/ACT inhaler Inhale 2 puffs into the lungs every 4 (four) hours as needed for wheezing. 01/12/12 09/10/13  Jade L Breeback, PA-C  buPROPion (WELLBUTRIN XL) 300 MG 24 hr tablet TAKE ONE TABLET BY MOUTH EVERY MORNING  05/02/14   Hali Marry, MD  EPINEPHrine (EPIPEN 2-PAK) 0.3 mg/0.3 mL SOAJ injection Inject 0.3 mLs (0.3 mg total) into the muscle once. 12/24/13   Hali Marry, MD  fluticasone (FLONASE) 50 MCG/ACT nasal spray Place 2 sprays into both nostrils daily. 08/08/13   Jade L Breeback, PA-C  folic acid (FOLVITE) 354 MCG tablet Take 400 mcg by mouth daily.      Historical Provider, MD  Golimumab ([place] ARIA IV) Inject into the vein.    Historical Provider, MD  HYDROcodone-acetaminophen (VICODIN) 5-500 MG per tablet Take 1 tablet by mouth every 6 (six) hours as needed.      Historical Provider, MD  levothyroxine (SYNTHROID, LEVOTHROID) 75 MCG tablet TAKE ONE TABLET BY MOUTH ONE TIME DAILY  06/04/14   Hali Marry, MD  meloxicam (MOBIC) 7.5 MG tablet Take 1 tablet (7.5  mg total) by mouth as needed. 01/08/13   Donella Stade, PA-C  methotrexate (RHEUMATREX) 2.5 MG tablet  06/06/14   Historical Provider, MD  omeprazole (PRILOSEC) 20 MG capsule Take 20 mg by mouth daily.      Historical Provider, MD  traZODone (DESYREL) 150 MG tablet Take 1 tablet (150 mg total) by mouth at bedtime. 09/28/13   Hali Marry, MD   BP 151/83 mmHg  Pulse 95  Temp(Src) 99.4 F (37.4 C) (Oral)  Resp 16  Wt 192 lb (87.091 kg)  SpO2 100%  LMP 07/05/2014 (Approximate) Physical Exam  Constitutional: She is oriented to person, place, and time. She appears well-developed and well-nourished.  HENT:  Head: Normocephalic and  atraumatic.  Right Ear: External ear normal.  Left Ear: External ear normal.  Nose: Nose normal.  Mouth/Throat: Oropharynx is clear and moist.  Eyes: EOM are normal. Pupils are equal, round, and reactive to light.  Neck: Normal range of motion.  Cardiovascular: Normal rate and normal heart sounds.   Pulmonary/Chest: Effort normal.  Abdominal: She exhibits no distension.  Musculoskeletal: Normal range of motion.  Neurological: She is alert and oriented to person, place, and time.  Psychiatric: She has a normal mood and affect.  Nursing note and vitals reviewed.   ED Course  Procedures (including critical care time) Labs Review Labs Reviewed - No data to display  Imaging Review Dg Chest 2 View  08/05/2014   CLINICAL DATA:  Cough since last night  EXAM: CHEST  2 VIEW  COMPARISON:  04/30/2014  FINDINGS: The heart size and mediastinal contours are within normal limits. Both lungs are clear. The visualized skeletal structures are unremarkable.  IMPRESSION: No active cardiopulmonary disease.   Electronically Signed   By: Kathreen Devoid   On: 08/05/2014 10:57     MDM  Pt has RA on Methotrexate.  Pt has had pneumonia multiple times.  Pt wants treatment before worsening.   1. Acute bronchitis due to other specified organisms   2. Cough    zithromax tussionex See Dr. Madilyn Fireman for recheck in 1 week if symptoms persist AVS   Fransico Meadow, PA-C 08/05/14 [id number]

## 2014-08-06 ENCOUNTER — Ambulatory Visit: Payer: BC Managed Care – PPO | Admitting: Family Medicine

## 2014-10-09 ENCOUNTER — Other Ambulatory Visit: Payer: Self-pay | Admitting: Family Medicine

## 2014-10-10 ENCOUNTER — Other Ambulatory Visit: Payer: Self-pay | Admitting: Family Medicine

## 2014-11-29 ENCOUNTER — Ambulatory Visit (INDEPENDENT_AMBULATORY_CARE_PROVIDER_SITE_OTHER): Payer: BLUE CROSS/BLUE SHIELD | Admitting: Family Medicine

## 2014-11-29 ENCOUNTER — Encounter: Payer: Self-pay | Admitting: Family Medicine

## 2014-11-29 VITALS — BP 119/81 | HR 96 | Temp 98.4°F | Wt 189.0 lb

## 2014-11-29 DIAGNOSIS — D899 Disorder involving the immune mechanism, unspecified: Secondary | ICD-10-CM

## 2014-11-29 DIAGNOSIS — J019 Acute sinusitis, unspecified: Secondary | ICD-10-CM

## 2014-11-29 DIAGNOSIS — J209 Acute bronchitis, unspecified: Secondary | ICD-10-CM

## 2014-11-29 DIAGNOSIS — D849 Immunodeficiency, unspecified: Secondary | ICD-10-CM

## 2014-11-29 MED ORDER — AZITHROMYCIN 250 MG PO TABS: ORAL_TABLET | ORAL | Status: DC

## 2014-11-29 NOTE — Progress Notes (Signed)
   Subjective:    Patient ID: Nancy Wang, female    DOB: 11/22/1970, 44 y.o.   MRN: [id number]  HPI 3 days of cough, low grade fever and congestion. Mild ST.  No runny nose. No ear pain. No GI sxs.  No meds for it.  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     Review of Systems     Objective:   Physical Exam  Constitutional: She is oriented to person, place, and time. She appears well-developed and well-nourished.  HENT:  Head: Normocephalic and atraumatic.  Right Ear: External ear normal.  Left Ear: External ear normal.  Nose: Nose normal.  Mouth/Throat: Oropharynx is clear and moist.  TMs and canals are clear.   Eyes: Conjunctivae and EOM are normal. Pupils are equal, round, and reactive to light.  Neck: Neck supple. No thyromegaly present.  Cardiovascular: Normal rate, regular rhythm and normal heart sounds.   Pulmonary/Chest: Effort normal and breath sounds normal. She has no wheezes.  Lymphadenopathy:    She has no cervical adenopathy.  Neurological: She is alert and oriented to person, place, and time.  Skin: Skin is warm and dry.  Psychiatric: She has a normal mood and affect.          Assessment & Plan:  Acute bronchitis/sinusitis - Likely viral but since on immunosuppressants witll give her an antibiotic to fill over the weekend if needed. Continue symptomatic care. Calll if not better in one week or if getting worse.

## 2014-12-29 ENCOUNTER — Other Ambulatory Visit: Payer: Self-pay | Admitting: Family Medicine

## 2015-01-15 ENCOUNTER — Other Ambulatory Visit: Payer: Self-pay | Admitting: Family Medicine

## 2015-02-16 ENCOUNTER — Other Ambulatory Visit: Payer: Self-pay | Admitting: Family Medicine

## 2015-03-05 ENCOUNTER — Ambulatory Visit (INDEPENDENT_AMBULATORY_CARE_PROVIDER_SITE_OTHER): Payer: BLUE CROSS/BLUE SHIELD | Admitting: Family Medicine

## 2015-03-05 ENCOUNTER — Encounter: Payer: Self-pay | Admitting: Family Medicine

## 2015-03-05 VITALS — BP 132/75 | HR 104 | Ht 65.0 in | Wt 195.0 lb

## 2015-03-05 DIAGNOSIS — Z13220 Encounter for screening for lipoid disorders: Secondary | ICD-10-CM | POA: Diagnosis not present

## 2015-03-05 DIAGNOSIS — E039 Hypothyroidism, unspecified: Secondary | ICD-10-CM

## 2015-03-05 DIAGNOSIS — F329 Major depressive disorder, single episode, unspecified: Secondary | ICD-10-CM

## 2015-03-05 DIAGNOSIS — E785 Hyperlipidemia, unspecified: Secondary | ICD-10-CM

## 2015-03-05 DIAGNOSIS — R030 Elevated blood-pressure reading, without diagnosis of hypertension: Secondary | ICD-10-CM

## 2015-03-05 DIAGNOSIS — F32A Depression, unspecified: Secondary | ICD-10-CM

## 2015-03-05 DIAGNOSIS — G4700 Insomnia, unspecified: Secondary | ICD-10-CM

## 2015-03-05 MED ORDER — TRAZODONE HCL 150 MG PO TABS: 150.0000 mg | ORAL_TABLET | Freq: Every day | ORAL | Status: DC

## 2015-03-05 MED ORDER — EPINEPHRINE 0.3 MG/0.3ML IJ SOAJ: 0.3000 mg | Freq: Once | INTRAMUSCULAR | Status: DC

## 2015-03-05 MED ORDER — BUPROPION HCL ER (XL) 300 MG PO TB24: 300.0000 mg | ORAL_TABLET | Freq: Every morning | ORAL | Status: DC

## 2015-03-05 NOTE — Progress Notes (Signed)
   Subjective:    Patient ID: Nohelani Benning, female    DOB: 1971-04-08, 44 y.o.   MRN: [id number]  HPI Follow-up depression-she's currently taking Wellbutrin. She's doing well with the medication. Not having any side effects. She denies any major symptoms. Occasionally she'll have some difficulty with sleep and fatigue but otherwise feels that her mood is well controlled. She rates her symptoms as not difficult at all.  Sleep is better than it was.  Using the trazodone PRN.  No S.e.    Review of Systems     Objective:   Physical Exam  Constitutional: She is oriented to person, place, and time. She appears well-developed and well-nourished.  HENT:  Head: Normocephalic and atraumatic.  Cardiovascular: Normal rate, regular rhythm and normal heart sounds.   Pulmonary/Chest: Effort normal and breath sounds normal.  Neurological: She is alert and oriented to person, place, and time.  Skin: Skin is warm and dry.  Psychiatric: She has a normal mood and affect. Her behavior is normal.          Assessment & Plan:  Depression-well controlled. PHQ 9 score of 0 today. Previous was 3. We discussed potentially weaning her medication regimen.    Insomnia - Not well controlled. Uses trazodone PRN.  Refilled today.   Due for repeat screening lipid.

## 2015-03-12 ENCOUNTER — Encounter: Payer: Self-pay | Admitting: *Deleted

## 2015-03-12 ENCOUNTER — Emergency Department
Admission: EM | Admit: 2015-03-12 | Discharge: 2015-03-12 | Disposition: A | Discharge status: Home / Self Care | Payer: BLUE CROSS/BLUE SHIELD | Source: Home / Self Care | Attending: Family Medicine | Admitting: Family Medicine

## 2015-03-12 DIAGNOSIS — B9789 Other viral agents as the cause of diseases classified elsewhere: Secondary | ICD-10-CM

## 2015-03-12 DIAGNOSIS — H65191 Other acute nonsuppurative otitis media, right ear: Secondary | ICD-10-CM | POA: Diagnosis not present

## 2015-03-12 DIAGNOSIS — J069 Acute upper respiratory infection, unspecified: Secondary | ICD-10-CM | POA: Diagnosis not present

## 2015-03-12 MED ORDER — BENZONATATE 200 MG PO CAPS: 200.0000 mg | ORAL_CAPSULE | Freq: Every day | ORAL | Status: DC

## 2015-03-12 MED ORDER — CEFDINIR 300 MG PO CAPS: 300.0000 mg | ORAL_CAPSULE | Freq: Two times a day (BID) | ORAL | Status: DC

## 2015-03-12 NOTE — ED Notes (Signed)
Pt c/o 3 days of productive cough, intermittent low grade fever and ear pain.

## 2015-03-12 NOTE — ED Provider Notes (Signed)
CSN: [id number]     Arrival date & time 03/12/15  0940 History   First MD Initiated Contact with Patient 03/12/15 1109     Chief Complaint  Patient presents with  . Otalgia  . Cough      HPI Comments: Patient complains of five day history of typical cold-like symptoms including mild sore throat, sinus congestion, headache, fatigue, and cough.  She developed right earache last night.  She has had low grade fever to 100.6  The history is provided by the patient.    Past Medical History  Diagnosis Date  . Thyroid disease     hypothyroidism  . GERD (gastroesophageal reflux disease)   . Pseudogout     Dr. Ouida Sills  . RA (rheumatoid arthritis)     Follow by Tobie Lords, MD - [facility]  . Bronchitis    Past Surgical History  Procedure Laterality Date  . Exploratory lap  1985, 2004    for ovarian cysts, infertility  . Arthroscopic surgery      both knees, RT 1985 removed patella, LT 1988  . Essure tubal ligation    . Abdominal surgery     Family History  Problem Relation Age of Onset  . Hypertension Mother   . Diabetes Mother   . Cancer Father     prostate  . Coronary artery disease Father   . Depression Father   . Diabetes      family history  . Hypertension      family history   History  Substance Use Topics  . Smoking status: Never Smoker   . Smokeless tobacco: Never Used  . Alcohol Use: No   OB History    No data available     Review of Systems + sore throat + cough No pleuritic pain No wheezing + nasal congestion + post-nasal drainage No sinus pain/pressure No itchy/red eyes + right earache No hemoptysis No SOB + fever, + chills + mild nausea No vomiting No abdominal pain No diarrhea No urinary symptoms No skin rash + fatigue No myalgias + headache Used OTC meds without relief  Allergies  Citalopram and Morphine sulfate  Home Medications   Prior to Admission medications   Medication Sig Start Date End Date  Taking? Authorizing Provider  benzonatate (TESSALON) 200 MG capsule Take 1 capsule (200 mg total) by mouth at bedtime. Take as needed for cough 03/12/15   Kandra Nicolas, MD  buPROPion (WELLBUTRIN XL) 300 MG 24 hr tablet Take 1 tablet (300 mg total) by mouth every morning. 03/05/15   Hali Marry, MD  cefdinir (OMNICEF) 300 MG capsule Take 1 capsule (300 mg total) by mouth 2 (two) times daily. 03/12/15   Kandra Nicolas, MD  EPINEPHrine (EPIPEN 2-PAK) 0.3 mg/0.3 mL IJ SOAJ injection Inject 0.3 mLs (0.3 mg total) into the muscle once. 03/05/15   Hali Marry, MD  fluticasone (FLONASE) 50 MCG/ACT nasal spray Place 2 sprays into both nostrils daily. 08/08/13   Jade L Breeback, PA-C  folic acid (FOLVITE) 240 MCG tablet Take 400 mcg by mouth daily.      Historical Provider, MD  Golimumab ([place] ARIA IV) Inject into the vein.    Historical Provider, MD  HYDROcodone-acetaminophen (VICODIN) 5-500 MG per tablet Take 1 tablet by mouth every 6 (six) hours as needed.      Historical Provider, MD  levothyroxine (SYNTHROID, LEVOTHROID) 75 MCG tablet TAKE ONE TABLET BY MOUTH ONE TIME DAILY 12/30/14   Rene Kocher  [place], MD  meloxicam (MOBIC) 7.5 MG tablet Take 1 tablet (7.5 mg total) by mouth as needed. 01/08/13   Donella Stade, PA-C  methotrexate (RHEUMATREX) 2.5 MG tablet  06/06/14   Historical Provider, MD  omeprazole (PRILOSEC) 20 MG capsule Take 20 mg by mouth daily.      Historical Provider, MD  traZODone (DESYREL) 150 MG tablet Take 1 tablet (150 mg total) by mouth at bedtime. 03/05/15   Hali Marry, MD   BP 113/78 mmHg  Pulse 116  Temp(Src) 98.8 F (37.1 C) (Oral)  Resp 20  Wt 190 lb (86.183 kg)  SpO2 99%  LMP 03/09/2015 Physical Exam Nursing notes and Vital Signs reviewed. Appearance:  Patient appears stated age, and in no acute distress Eyes:  Pupils are equal, round, and reactive to light and accomodation.  Extraocular movement is intact.  Conjunctivae are not inflamed   Ears:  Canals normal.  Right tympanic membrane erythematous with poor landmarks.  Left tympanic membrane is normal  Nose:  Congested turbinates.  No sinus tenderness.   Pharynx:  Minimal erythema Neck:  Supple.  Tender enlarged posterior nodes are palpated bilaterally  Lungs:  Clear to auscultation.  Breath sounds are equal.  Chest:  Distinct tenderness to palpation over the mid-sternum.  Heart:  Regular rate and rhythm without murmurs, rubs, or gallops.  Rate 120  Abdomen:  Nontender without masses or hepatosplenomegaly.  Bowel sounds are present.  No CVA or flank tenderness.  Extremities:  No edema.  No calf tenderness Skin:  No rash present.   ED Course  Procedures  none   MDM   1. Acute nonsuppurative otitis media of right ear   2. Viral URI with cough    Begin Omnicef 300mg  BID for 10 days.  Prescription written for Benzonatate [facility]) to take at bedtime for night-time cough.  Take plain guaifenesin (1200mg  extended release tabs such as Mucinex) twice daily, with plenty of water, for cough and congestion.  May add Pseudoephedrine (30mg , one or two every 4 to 6 hours) for sinus congestion.  Get adequate rest.   May use Afrin nasal spray (or generic oxymetazoline) twice daily for about 5 days.  Also recommend using saline nasal spray several times daily and saline nasal irrigation (AYR is a common brand).  Use Flonase nasal spray each morning after using Afrin nasal spray and saline nasal irrigation. Try warm salt water gargles for sore throat.  Stop all antihistamines for now, and other non-prescription cough/cold preparations. May take Ibuprofen 200mg , 4 tabs every 8 hours with food for chest/sternum discomfort.    Follow-up with family doctor if not improving about10 days.     Kandra Nicolas, MD 03/12/15 [phone]

## 2015-03-12 NOTE — Discharge Instructions (Signed)
Take plain guaifenesin (1200mg  extended release tabs such as Mucinex) twice daily, with plenty of water, for cough and congestion.  May add Pseudoephedrine (30mg , one or two every 4 to 6 hours) for sinus congestion.  Get adequate rest.   May use Afrin nasal spray (or generic oxymetazoline) twice daily for about 5 days.  Also recommend using saline nasal spray several times daily and saline nasal irrigation (AYR is a common brand).  Use Flonase nasal spray each morning after using Afrin nasal spray and saline nasal irrigation. Try warm salt water gargles for sore throat.  Stop all antihistamines for now, and other non-prescription cough/cold preparations. May take Ibuprofen 200mg , 4 tabs every 8 hours with food for chest/sternum discomfort.    Follow-up with family doctor if not improving about10 days.

## 2015-03-20 ENCOUNTER — Other Ambulatory Visit: Payer: Self-pay | Admitting: Family Medicine

## 2015-03-21 ENCOUNTER — Ambulatory Visit (INDEPENDENT_AMBULATORY_CARE_PROVIDER_SITE_OTHER): Payer: BLUE CROSS/BLUE SHIELD | Admitting: Family Medicine

## 2015-03-21 ENCOUNTER — Encounter: Payer: Self-pay | Admitting: Family Medicine

## 2015-03-21 VITALS — BP 124/76 | HR 91 | Temp 97.8°F | Wt 194.0 lb

## 2015-03-21 DIAGNOSIS — H6981 Other specified disorders of Eustachian tube, right ear: Secondary | ICD-10-CM | POA: Diagnosis not present

## 2015-03-21 MED ORDER — PREDNISONE 20 MG PO TABS: ORAL_TABLET | ORAL | Status: AC

## 2015-03-21 NOTE — Progress Notes (Signed)
CC: Nancy Wang is a 44 y.o. female is here for fluid in right ear?   Subjective: HPI:   2 weeks of right ear fullness and mild decreased hearing. It was originally accompanied by facial pressure in the cheeks, nasal congestion,nasal drip however this all resolved after taking 9 days of Omnicef. Her ear fullness has not gotten better or worse since onset. Nothing particularly makes it better or worse. She's tried some medicated eardrops that she got at a pharmacy but it did not help. She states she's feeling like she is in her relationship help other than the ear complaints above. She denies any fevers, chills, sneezing, cough, shortness of breath, sore throat, nor headache.   Review Of Systems Outlined In HPI  Past Medical History  Diagnosis Date  . Thyroid disease     hypothyroidism  . GERD (gastroesophageal reflux disease)   . Pseudogout     Dr. Ouida Sills  . RA (rheumatoid arthritis)     Follow by Tobie Lords, MD - [facility]  . Bronchitis     Past Surgical History  Procedure Laterality Date  . Exploratory lap  1985, 2004    for ovarian cysts, infertility  . Arthroscopic surgery      both knees, RT 1985 removed patella, LT 1988  . Essure tubal ligation    . Abdominal surgery     Family History  Problem Relation Age of Onset  . Hypertension Mother   . Diabetes Mother   . Cancer Father     prostate  . Coronary artery disease Father   . Depression Father   . Diabetes      family history  . Hypertension      family history    History   Social History  . Marital Status: Married    Spouse Name: N/A  . Number of Children: N/A  . Years of Education: N/A   Occupational History  . Not on file.   Social History Main Topics  . Smoking status: Never Smoker   . Smokeless tobacco: Never Used  . Alcohol Use: No  . Drug Use: No  . Sexual Activity: Not on file     Comment: married, no regular exercise, limited by knee pain, has son.    Other  Topics Concern  . Not on file   Social History Narrative     Objective: BP 124/76 mmHg  Pulse 91  Temp(Src) 97.8 F (36.6 C) (Oral)  Wt 194 lb (87.998 kg)  LMP 03/09/2015  General: Alert and Oriented, No Acute Distress HEENT: Pupils equal, round, reactive to light. Conjunctivae clear.  External ears unremarkable, canals clear with intact TMs with appropriate landmarks.  Left Middle ear appears open without effusion, right middle ear with a serous effusion. Pink inferior turbinates.  Moist mucous membranes, pharynx without inflammation nor lesions.  Neck supple without palpable lymphadenopathy nor abnormal masses. Neuro:Rinne test normal, Weber lateralizes to the right Extremities: No peripheral edema.  Strong peripheral pulses.  Mental Status: No depression, anxiety, nor agitation. Skin: Warm and dry.  Assessment & Plan: Nancy Wang was seen today for fluid in right ear?.  Diagnoses and all orders for this visit:  Eustachian tube dysfunction, right Orders: -     predniSONE (DELTASONE) 20 MG tablet; Three tabs daily days 1-3, two tabs daily days 4-6, one tab daily days 7-9, half tab daily days 10-13.   Eustachian tube dysfunction causing serous effusion in the right ear less resulting in increased hearing, start prednisone  taper  Return if symptoms worsen or fail to improve.

## 2015-04-01 ENCOUNTER — Other Ambulatory Visit: Payer: Self-pay | Admitting: Family Medicine

## 2015-06-19 ENCOUNTER — Ambulatory Visit (INDEPENDENT_AMBULATORY_CARE_PROVIDER_SITE_OTHER): Payer: BLUE CROSS/BLUE SHIELD | Admitting: Family Medicine

## 2015-06-19 DIAGNOSIS — Z23 Encounter for immunization: Secondary | ICD-10-CM | POA: Diagnosis not present

## 2015-08-11 ENCOUNTER — Encounter: Payer: Self-pay | Admitting: Emergency Medicine

## 2015-08-11 ENCOUNTER — Emergency Department
Admission: EM | Admit: 2015-08-11 | Discharge: 2015-08-11 | Disposition: A | Discharge status: Home / Self Care | Payer: BLUE CROSS/BLUE SHIELD | Source: Home / Self Care | Attending: Family Medicine | Admitting: Family Medicine

## 2015-08-11 DIAGNOSIS — J209 Acute bronchitis, unspecified: Secondary | ICD-10-CM

## 2015-08-11 MED ORDER — BENZONATATE 100 MG PO CAPS: 100.0000 mg | ORAL_CAPSULE | Freq: Three times a day (TID) | ORAL | Status: DC

## 2015-08-11 MED ORDER — ALBUTEROL SULFATE HFA 108 (90 BASE) MCG/ACT IN AERS: 1.0000 | INHALATION_SPRAY | Freq: Four times a day (QID) | RESPIRATORY_TRACT | Status: DC | PRN

## 2015-08-11 MED ORDER — AZITHROMYCIN 250 MG PO TABS: 250.0000 mg | ORAL_TABLET | Freq: Every day | ORAL | Status: DC

## 2015-08-11 MED ORDER — DM-GUAIFENESIN ER 30-600 MG PO TB12: 1.0000 | ORAL_TABLET | Freq: Two times a day (BID) | ORAL | Status: DC

## 2015-08-11 NOTE — ED Notes (Signed)
Cough x 3 days, worse today, seal bark started this morning

## 2015-08-11 NOTE — ED Provider Notes (Signed)
CSN: [id number]     Arrival date & time 08/11/15  1313 History   First MD Initiated Contact with Patient 08/11/15 1334     Chief Complaint  Patient presents with  . Cough   (Consider location/radiation/quality/duration/timing/severity/associated sxs/prior Treatment) HPI  Pt is a 44yo female presenting to [facility] with c/o worsening dry-to-productive cough that started 3 days ago, associated hoarseness, sore throat and body aches with fatigue. Pt also notes "low grade fever, 99*F"  Pt states she takes Methotrexate for her RA and notes she has had multiple episodes of pneumonia in the past. 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  She denies n/v/d.  Denies recent travel. Pt does have hx of asthma and states she needs a refill on her albuterol inhaler. She has not taken any medications at home for her current cough.  Past Medical History  Diagnosis Date  . Thyroid disease     hypothyroidism  . GERD (gastroesophageal reflux disease)   . Pseudogout     Dr. Ouida Sills  . RA (rheumatoid arthritis) ([place])     Follow by Tobie Lords, MD - [facility]  . Bronchitis    Past Surgical History  Procedure Laterality Date  . Exploratory lap  1985, 2004    for ovarian cysts, infertility  . Arthroscopic surgery      both knees, RT 1985 removed patella, LT 1988  . Essure tubal ligation    . Abdominal surgery     Family History  Problem Relation Age of Onset  . Hypertension Mother   . Diabetes Mother   . Cancer Father     prostate  . Coronary artery disease Father   . Depression Father   . Diabetes      family history  . Hypertension      family history   Social History  Substance Use Topics  . Smoking status: Never Smoker   . Smokeless tobacco: Never Used  . Alcohol Use: Yes   OB History    No data available     Review of Systems  Constitutional: Positive for fever ("low grade 99 F" ) and fatigue. Negative for  chills.  HENT: Positive for congestion, sore throat and voice change. Negative for ear pain and trouble swallowing.   Respiratory: Positive for cough and wheezing. Negative for shortness of breath.   Cardiovascular: Negative for chest pain and palpitations.  Gastrointestinal: Negative for nausea, vomiting, abdominal pain and diarrhea.  Musculoskeletal: Positive for myalgias and arthralgias. Negative for back pain.  Skin: Negative for rash.  All other systems reviewed and are negative.   Allergies  Citalopram and Morphine sulfate  Home Medications   Prior to Admission medications   Medication Sig Start Date End Date Taking? Authorizing Provider  albuterol (PROVENTIL HFA;VENTOLIN HFA) 108 (90 BASE) MCG/ACT inhaler Inhale 1-2 puffs into the lungs every 6 (six) hours as needed for wheezing or shortness of breath. 08/11/15   Noland Fordyce, PA-C  azithromycin (ZITHROMAX) 250 MG tablet Take 1 tablet (250 mg total) by mouth daily. Take first 2 tablets together, then 1 every day until finished. 08/11/15   Noland Fordyce, PA-C  benzonatate (TESSALON) 100 MG capsule Take 1 capsule (100 mg total) by mouth every 8 (eight) hours. 08/11/15   Noland Fordyce, PA-C  benzonatate (TESSALON) 200 MG capsule Take 1 capsule (200 mg total) by mouth at bedtime. Take as needed for cough Patient not taking: Reported  on 03/21/2015 03/12/15   Kandra Nicolas, MD  buPROPion (WELLBUTRIN XL) 300 MG 24 hr tablet Take 1 tablet (300 mg total) by mouth every morning. 03/05/15   Hali Marry, MD  cefdinir (OMNICEF) 300 MG capsule Take 1 capsule (300 mg total) by mouth 2 (two) times daily. 03/12/15   Kandra Nicolas, MD  dextromethorphan-guaiFENesin [facility] DM) 30-600 MG 12hr tablet Take 1 tablet by mouth 2 (two) times daily. 08/11/15   Noland Fordyce, PA-C  EPINEPHrine (EPIPEN 2-PAK) 0.3 mg/0.3 mL IJ SOAJ injection Inject 0.3 mLs (0.3 mg total) into the muscle once. 03/05/15   Hali Marry, MD  fluticasone (FLONASE) 50  MCG/ACT nasal spray Place 2 sprays into both nostrils daily. 08/08/13   Jade L Breeback, PA-C  folic acid (FOLVITE) [id number] MCG tablet Take 400 mcg by mouth daily.      Historical Provider, MD  Golimumab ([place] ARIA IV) Inject into the vein.    Historical Provider, MD  HYDROcodone-acetaminophen (VICODIN) 5-500 MG per tablet Take 1 tablet by mouth every 6 (six) hours as needed.      Historical Provider, MD  levothyroxine (SYNTHROID, LEVOTHROID) 75 MCG tablet TAKE ONE TABLET BY MOUTH ONE TIME DAILY 04/01/15   Hali Marry, MD  meloxicam (MOBIC) 7.5 MG tablet Take 1 tablet (7.5 mg total) by mouth as needed. 01/08/13   Donella Stade, PA-C  methotrexate (RHEUMATREX) 2.5 MG tablet  06/06/14   Historical Provider, MD  omeprazole (PRILOSEC) 20 MG capsule Take 20 mg by mouth daily.      Historical Provider, MD  traZODone (DESYREL) 150 MG tablet Take 1 tablet (150 mg total) by mouth at bedtime. 03/05/15   Hali Marry, MD   Meds Ordered and Administered this Visit  Medications - No data to display  BP 124/84 mmHg  Pulse 104  Temp(Src) 98.2 F (36.8 C) (Oral)  Ht 5\' 6"  (1.676 m)  Wt 198 lb (89.812 kg)  BMI 31.97 kg/m2  SpO2 98% No data found.   Physical Exam  Constitutional: She appears well-developed and well-nourished. No distress.  HENT:  Head: Normocephalic and atraumatic.  Right Ear: Hearing, tympanic membrane, external ear and ear canal normal.  Left Ear: Hearing, tympanic membrane, external ear and ear canal normal.  Nose: Nose normal.  Mouth/Throat: Uvula is midline, oropharynx is clear and moist and mucous membranes are normal.  Eyes: Conjunctivae are normal. No scleral icterus.  Neck: Normal range of motion. Neck supple.  Hoarse voice but no stridor.   Cardiovascular: Normal rate, regular rhythm and normal heart sounds.   Pulmonary/Chest: Effort normal. No stridor. No respiratory distress. She has wheezes. She has rhonchi. She has no rales. She exhibits no tenderness.   Intermittent dry hacking cough. No respiratory distress. Able to speak in full sentences. No accessory muscle use. Lungs: faint expiratory wheeze in lower lung fields. Diffuse rhonchi, clears with coughing.  Abdominal: Soft. Bowel sounds are normal. She exhibits no distension and no mass. There is no tenderness. There is no rebound and no guarding.  Musculoskeletal: Normal range of motion.  Lymphadenopathy:    She has no cervical adenopathy.  Neurological: She is alert.  Skin: Skin is warm and dry. She is not diaphoretic.  Nursing note and vitals reviewed.   ED Course  Procedures (including critical care time)  Labs Review Labs Reviewed - No data to display  Imaging Review No results found.    MDM   1. Acute bronchitis, unspecified organism  Pt c/o worsening cough. Hx of pneumonia and recent sick contacts at home and school who had pneumonia. Pt is immunocompromised as she takes Methotrexate for her RA.  Vitals: mild tachycardia, otherwise, unremarkable. O2 Sat 98% on RA  Rx: Azithromycin, tessalon, Mucinex DM, and albuterol inhaler. Encouraged rest and fluids. May take acetaminophen and/or ibuprofen for fever or pain. F/u with PCP in 7-10 days if not improving, sooner if worsening. Patient verbalized understanding and agreement with treatment plan.    Noland Fordyce, PA-C 08/11/15 1353

## 2015-08-11 NOTE — Discharge Instructions (Signed)
You may take 400-600mg  Ibuprofen (Motrin) every 6-8 hours for fever and pain  Alternate with Tylenol  You may take 500mg  Tylenol every 4-6 hours as needed for fever and pain  Follow-up with your primary care provider next week for recheck of symptoms if not improving.  Be sure to drink plenty of fluids and rest, at least 8hrs of sleep a night, preferably more while you are sick. Return urgent care or go to closest ER if you cannot keep down fluids/signs of dehydration, fever not reducing with Tylenol, difficulty breathing/wheezing, stiff neck, worsening condition, or other concerns (see below)  Please take antibiotics as prescribed and be sure to complete entire course even if you start to feel better to ensure infection does not come back.  Acute Bronchitis Bronchitis is when the airways that extend from the windpipe into the lungs get red, puffy, and painful (inflamed). Bronchitis often causes thick spit (mucus) to develop. This leads to a cough. A cough is the most common symptom of bronchitis. In acute bronchitis, the condition usually begins suddenly and goes away over time (usually in 2 weeks). Smoking, allergies, and asthma can make bronchitis worse. Repeated episodes of bronchitis may cause more lung problems. HOME CARE  Rest.  Drink enough fluids to keep your pee (urine) clear or pale yellow (unless you need to limit fluids as told by your doctor).  Only take over-the-counter or prescription medicines as told by your doctor.  Avoid smoking and secondhand smoke. These can make bronchitis worse. If you are a smoker, think about using nicotine gum or skin patches. Quitting smoking will help your lungs heal faster.  Reduce the chance of getting bronchitis again by:  Washing your hands often.  Avoiding people with cold symptoms.  Trying not to touch your hands to your mouth, nose, or eyes.  Follow up with your doctor as told. GET HELP IF: Your symptoms do not improve after 1 week  of treatment. Symptoms include:  Cough.  Fever.  Coughing up thick spit.  Body aches.  Chest congestion.  Chills.  Shortness of breath.  Sore throat. GET HELP RIGHT AWAY IF:   You have an increased fever.  You have chills.  You have severe shortness of breath.  You have bloody thick spit (sputum).  You throw up (vomit) often.  You lose too much body fluid (dehydration).  You have a severe headache.  You faint. MAKE SURE YOU:   Understand these instructions.  Will watch your condition.  Will get help right away if you are not doing well or get worse.   This information is not intended to replace advice given to you by your health care provider. Make sure you discuss any questions you have with your health care provider.   Document Released: 03/01/2008 Document Revised: 05/16/2013 Document Reviewed: 03/06/2013 Elsevier Interactive Patient Education [employer].

## 2015-09-19 ENCOUNTER — Other Ambulatory Visit: Payer: Self-pay | Admitting: Family Medicine

## 2015-10-27 ENCOUNTER — Other Ambulatory Visit: Payer: Self-pay | Admitting: Family Medicine

## 2015-11-02 ENCOUNTER — Emergency Department (INDEPENDENT_AMBULATORY_CARE_PROVIDER_SITE_OTHER)
Admission: EM | Admit: 2015-11-02 | Discharge: 2015-11-02 | Disposition: A | Discharge status: Home / Self Care | Payer: Managed Care, Other (non HMO) | Source: Home / Self Care | Attending: Family Medicine | Admitting: Family Medicine

## 2015-11-02 ENCOUNTER — Encounter: Payer: Self-pay | Admitting: Emergency Medicine

## 2015-11-02 DIAGNOSIS — J020 Streptococcal pharyngitis: Secondary | ICD-10-CM | POA: Diagnosis not present

## 2015-11-02 DIAGNOSIS — B9789 Other viral agents as the cause of diseases classified elsewhere: Secondary | ICD-10-CM

## 2015-11-02 DIAGNOSIS — J069 Acute upper respiratory infection, unspecified: Secondary | ICD-10-CM | POA: Diagnosis not present

## 2015-11-02 LAB — POCT RAPID STREP A (OFFICE): Rapid Strep A Screen: POSITIVE — AB

## 2015-11-02 MED ORDER — BENZONATATE 200 MG PO CAPS: 200.0000 mg | ORAL_CAPSULE | Freq: Every day | ORAL | Status: DC

## 2015-11-02 MED ORDER — PENICILLIN V POTASSIUM 500 MG PO TABS: ORAL_TABLET | ORAL | Status: DC

## 2015-11-02 NOTE — ED Provider Notes (Signed)
CSN: [id number]     Arrival date & time 11/02/15  1110 History   First MD Initiated Contact with Patient 11/02/15 1300     Chief Complaint  Patient presents with  . Sore Throat     HPI Comments: Patient complains of four day history of typical cold-like symptoms including mild sore throat, sinus congestion, headache, chills, fatigue, and cough.  Her sore throat has increased during the past two days.  The history is provided by the patient.    Past Medical History  Diagnosis Date  . Thyroid disease     hypothyroidism  . GERD (gastroesophageal reflux disease)   . Pseudogout     Dr. Ouida Sills  . RA (rheumatoid arthritis) ([place])     Follow by Tobie Lords, MD - [facility]  . Bronchitis    Past Surgical History  Procedure Laterality Date  . Exploratory lap  1985, 2004    for ovarian cysts, infertility  . Arthroscopic surgery      both knees, RT 1985 removed patella, LT 1988  . Essure tubal ligation    . Abdominal surgery     Family History  Problem Relation Age of Onset  . Hypertension Mother   . Diabetes Mother   . Cancer Father     prostate  . Coronary artery disease Father   . Depression Father   . Diabetes      family history  . Hypertension      family history   Social History  Substance Use Topics  . Smoking status: Never Smoker   . Smokeless tobacco: Never Used  . Alcohol Use: Yes   OB History    No data available     Review of Systems + sore throat + cough No pleuritic pain No wheezing + nasal congestion + post-nasal drainage No sinus pain/pressure No itchy/red eyes No earache No hemoptysis No SOB No fever, + chills + nausea + vomiting, resolved No abdominal pain No diarrhea No urinary symptoms No skin rash + fatigue + myalgias + headache Used OTC meds without relief  Allergies  Citalopram and Morphine sulfate  Home Medications   Prior to Admission medications   Medication Sig Start Date End Date Taking?  Authorizing Provider  benzonatate (TESSALON) 200 MG capsule Take 1 capsule (200 mg total) by mouth at bedtime. Take as needed for cough 11/02/15   Kandra Nicolas, MD  buPROPion (WELLBUTRIN XL) 300 MG 24 hr tablet TAKE 1 TABLET (300 MG TOTAL) BY MOUTH EVERY MORNING. 10/27/15   Hali Marry, MD  folic acid (FOLVITE) [id number] MCG tablet Take 400 mcg by mouth daily.      Historical Provider, MD  Golimumab ([place] ARIA IV) Inject into the vein.    Historical Provider, MD  levothyroxine (SYNTHROID, LEVOTHROID) 75 MCG tablet Take 1 tablet (75 mcg total) by mouth daily before breakfast. *REFILLS REQUIRE LAB WORK* 10/27/15   Hali Marry, MD  methotrexate [facility]) 2.5 MG tablet  06/06/14   Historical Provider, MD  omeprazole (PRILOSEC) 20 MG capsule Take 20 mg by mouth daily.      Historical Provider, MD  penicillin v potassium (VEETID) 500 MG tablet Take one tab by mouth twice daily for 10 days 11/02/15   Kandra Nicolas, MD   Meds Ordered and Administered this Visit  Medications - No data to display  BP 141/85 mmHg  Pulse 100  Temp(Src) 98.7 F (37.1 C) (Oral)  Ht 5\' 6"  (1.676 m)  Wt 200  lb (90.719 kg)  BMI 32.30 kg/m2  SpO2 99%  LMP 10/30/2015 No data found.   Physical Exam Nursing notes and Vital Signs reviewed. Appearance:  Patient appears stated age, and in no acute distress Eyes:  Pupils are equal, round, and reactive to light and accomodation.  Extraocular movement is intact.  Conjunctivae are not inflamed  Ears:  Canals normal.  Tympanic membranes normal.  Nose:  Mildly congested turbinates.  No sinus tenderness.    Pharynx:   Erythematous and mildly edematous Neck:  Supple.  Tender enlarged posterior nodes are palpated bilaterally  Lungs:  Clear to auscultation.  Breath sounds are equal.  Moving air well. Heart:  Regular rate and rhythm without murmurs, rubs, or gallops.  Abdomen:  Nontender without masses or hepatosplenomegaly.  Bowel sounds are present.  No CVA or  flank tenderness.  Extremities:  No edema.  Skin:  No rash present.   ED Course  Procedures none    Labs Reviewed  POCT RAPID STREP A (OFFICE) - Abnormal; Notable for the following:    Rapid Strep A Screen Positive (*)    All other components within normal limits      MDM   1. Strep pharyngitis   2. Viral URI with cough    Begin PenVK.  Prescription written for Benzonatate [facility]) to take at bedtime for night-time cough.  Take plain guaifenesin (1200mg  extended release tabs such as Mucinex) twice daily, with plenty of water, for cough and congestion.  May add Pseudoephedrine (30mg , one or two every 4 to 6 hours) for sinus congestion.  Get adequate rest.   May use Afrin nasal spray (or generic oxymetazoline) twice daily for about 5 days and then discontinue.  Also recommend using saline nasal spray several times daily and saline nasal irrigation (AYR is a common brand).  Use Flonase nasal spray each morning after using Afrin nasal spray and saline nasal irrigation. Try warm salt water gargles for sore throat.  Stop all antihistamines for now, and other non-prescription cough/cold preparations.   Follow-up with family doctor if not improving about10 days.     Kandra Nicolas, MD 11/06/15 1115

## 2015-11-02 NOTE — ED Notes (Signed)
Pt c/o sore throat since Thursday, vomitting and fever yesterday, cough and cold sxs.

## 2015-11-02 NOTE — Discharge Instructions (Signed)
Take plain guaifenesin (1200mg extended release tabs such as Mucinex) twice daily, with plenty of water, for cough and congestion.  May add Pseudoephedrine (30mg, one or two every 4 to 6 hours) for sinus congestion.  Get adequate rest.   °May use Afrin nasal spray (or generic oxymetazoline) twice daily for about 5 days and then discontinue.  Also recommend using saline nasal spray several times daily and saline nasal irrigation (AYR is a common brand).  Use Flonase nasal spray each morning after using Afrin nasal spray and saline nasal irrigation. °Try warm salt water gargles for sore throat.  °Stop all antihistamines for now, and other non-prescription cough/cold preparations. °Follow-up with family doctor if not improving about10 days.  °

## 2015-11-07 ENCOUNTER — Telehealth: Payer: Self-pay

## 2015-12-25 ENCOUNTER — Ambulatory Visit (INDEPENDENT_AMBULATORY_CARE_PROVIDER_SITE_OTHER): Payer: Managed Care, Other (non HMO) | Admitting: Family Medicine

## 2015-12-25 ENCOUNTER — Encounter: Payer: Self-pay | Admitting: Family Medicine

## 2015-12-25 VITALS — BP 126/67 | HR 88 | Wt 198.0 lb

## 2015-12-25 DIAGNOSIS — R1031 Right lower quadrant pain: Secondary | ICD-10-CM | POA: Diagnosis not present

## 2015-12-25 DIAGNOSIS — N939 Abnormal uterine and vaginal bleeding, unspecified: Secondary | ICD-10-CM

## 2015-12-25 NOTE — Progress Notes (Signed)
   Subjective:    Patient ID: Nancy Wang, female    DOB: 16-Jun-1971, 45 y.o.   MRN: [id number]  HPI Patient comes in complaining of an abnormal menstrual period. Approximately 10 days ago she started her period when she normally would. It started out with some cramps in the morning which is very typical for her. She took some Midol but by 5 PM started to bleed very heavily. She went through 3 pads in about an hour and a half and that soaked through her underwear and her pants. She passed several large clots. One even larger than a quarter. She said after been on half it seems slow back down. The overall length of her menstrual cycle was typical for her. She did not have any fevers chills or recent illnesses. She denies any abnormal vaginal discharge or new sexual partners. She did have strep throat back in February.   Review of Systems     Objective:   Physical Exam  Constitutional: She is oriented to person, place, and time. She appears well-developed and well-nourished.  HENT:  Head: Normocephalic and atraumatic.  Cardiovascular: Normal rate, regular rhythm and normal heart sounds.   Pulmonary/Chest: Effort normal and breath sounds normal.  Abdominal: Soft. Bowel sounds are normal. She exhibits no distension and no mass. There is no tenderness. There is no rebound and no guarding.  Neurological: She is alert and oriented to person, place, and time.  Skin: Skin is warm and dry.  Psychiatric: She has a normal mood and affect. Her behavior is normal.          Assessment & Plan:  Abnormal menstrual bleeding-we'll schedule for pelvic ultrasound. Her Pap smear is up-to-date.She also had negative HPV testing says she's really not due for repeat testing until 2020. She denies any new sexual partners or abnormal discharge then we will just before with ultrasound. Consider further workup with of exam and STD testing at follow-up. Call with results once available.

## 2015-12-28 ENCOUNTER — Other Ambulatory Visit: Payer: Self-pay | Admitting: Family Medicine

## 2015-12-29 ENCOUNTER — Telehealth: Payer: Self-pay | Admitting: *Deleted

## 2015-12-29 DIAGNOSIS — E039 Hypothyroidism, unspecified: Secondary | ICD-10-CM

## 2015-12-29 NOTE — Telephone Encounter (Signed)
Pt overdue for labs. Order place

## 2015-12-30 ENCOUNTER — Ambulatory Visit (INDEPENDENT_AMBULATORY_CARE_PROVIDER_SITE_OTHER): Payer: Managed Care, Other (non HMO)

## 2015-12-30 DIAGNOSIS — R1031 Right lower quadrant pain: Secondary | ICD-10-CM | POA: Diagnosis not present

## 2015-12-30 DIAGNOSIS — N939 Abnormal uterine and vaginal bleeding, unspecified: Secondary | ICD-10-CM

## 2016-01-01 ENCOUNTER — Telehealth: Payer: Self-pay | Admitting: Family Medicine

## 2016-01-01 NOTE — Telephone Encounter (Signed)
I called pt and left a message for pt to get her labs drawn

## 2016-01-30 ENCOUNTER — Other Ambulatory Visit: Payer: Self-pay | Admitting: Family Medicine

## 2016-02-26 ENCOUNTER — Other Ambulatory Visit: Payer: Self-pay | Admitting: Family Medicine

## 2016-03-08 ENCOUNTER — Ambulatory Visit (INDEPENDENT_AMBULATORY_CARE_PROVIDER_SITE_OTHER): Payer: Managed Care, Other (non HMO) | Admitting: Family Medicine

## 2016-03-08 ENCOUNTER — Encounter: Payer: Self-pay | Admitting: Family Medicine

## 2016-03-08 VITALS — BP 135/71 | HR 95 | Temp 98.4°F | Wt 186.0 lb

## 2016-03-08 DIAGNOSIS — J019 Acute sinusitis, unspecified: Secondary | ICD-10-CM

## 2016-03-08 DIAGNOSIS — M7712 Lateral epicondylitis, left elbow: Secondary | ICD-10-CM

## 2016-03-08 DIAGNOSIS — S46812A Strain of other muscles, fascia and tendons at shoulder and upper arm level, left arm, initial encounter: Secondary | ICD-10-CM | POA: Diagnosis not present

## 2016-03-08 MED ORDER — AMOXICILLIN 875 MG PO TABS: 875.0000 mg | ORAL_TABLET | Freq: Two times a day (BID) | ORAL | Status: DC

## 2016-03-08 NOTE — Patient Instructions (Signed)

## 2016-03-08 NOTE — Progress Notes (Signed)
Subjective:    CC: nasal congestion.   HPI: Patient comes in today complaining of 10 days of not feeling well. She says it started with just feeling fatigued and exhausted. School was ending and she had a really busy week. She then developed significant nasal congestion with green and yellow mucus. She's had an intermittently mild sore throat. No facial pain or pressure. The last 2 days she has felt like she's been running a little low-grade temperature right around 100. No cough.  She's also complaining of left lateral elbow pain. She says it's been going on for about a week or 2. No known trauma or injury. She's not currently taking any medications for it. Pain over the left shoulder over the trapezius muscle.  Past medical history, Surgical history, Family history not pertinant except as noted below, Social history, Allergies, and medications have been entered into the medical record, reviewed, and corrections made.   Review of Systems: No fevers, chills, night sweats, weight loss, chest pain, or shortness of breath.   Objective:    Physical Exam  Constitutional: She is well-developed, well-nourished, and in no distress.  HENT:  Head: Normocephalic and atraumatic.  Right Ear: External ear normal.  Left Ear: External ear normal.  Nose: Nose normal.  Mouth/Throat: Oropharynx is clear and moist.  Some blood in the left nostril.   Eyes: Conjunctivae and EOM are normal. Pupils are equal, round, and reactive to light.  Neck: Neck supple. No thyromegaly present.  Cardiovascular: Normal rate, regular rhythm and normal heart sounds.   Pulmonary/Chest: Effort normal and breath sounds normal.  Musculoskeletal:  Muscle is very tight with knots over the left trapezius. Shoulder with normal range of motion. Left elbow and wrist with normal range of motion as well per she's very tender over the lateral epicondyles. Some discomfort with pronation against resistance. Hand strength is 5 out of 5.   Lymphadenopathy:    She has no cervical adenopathy.  Neurological: She is alert.  Skin: Skin is warm and dry.  Psychiatric: Affect and judgment normal.     Impression and Recommendations:   Acute sinusitis-we'll treat with 10 days of amoxicillin 875 mg by mouth twice a day. Next  Left tennis elbow. Handout given on stretches to do on her own. Recommend a trial of over-the-counter Aleve 1 tab twice a day. Make sure to take with food and water and stop immediately if any GI upset or irritation. If not improving over the next couple weeks and please let me know. Next  Muscle strain over the left trapezius-work on gentle stretches and exercises and using compression ever trigger areas. Anti-laboratory will help as well.

## 2016-03-20 ENCOUNTER — Other Ambulatory Visit: Payer: Self-pay | Admitting: Family Medicine

## 2016-06-26 ENCOUNTER — Other Ambulatory Visit: Payer: Self-pay | Admitting: Family Medicine

## 2016-07-22 ENCOUNTER — Other Ambulatory Visit: Payer: Self-pay | Admitting: Family Medicine

## 2016-07-26 ENCOUNTER — Other Ambulatory Visit: Payer: Self-pay | Admitting: Family Medicine

## 2016-08-03 ENCOUNTER — Other Ambulatory Visit: Payer: Self-pay | Admitting: Family Medicine

## 2016-08-05 ENCOUNTER — Telehealth: Payer: Self-pay | Admitting: Family Medicine

## 2016-08-05 NOTE — Telephone Encounter (Signed)
I called pt and left a message that she needs to call and schedule a f/u appt with Dr. Madilyn Fireman for her Thyroid

## 2016-08-16 ENCOUNTER — Other Ambulatory Visit: Payer: Self-pay | Admitting: Family Medicine

## 2016-08-19 ENCOUNTER — Other Ambulatory Visit: Payer: Self-pay | Admitting: Family Medicine

## 2016-08-24 ENCOUNTER — Other Ambulatory Visit: Payer: Self-pay

## 2016-08-24 ENCOUNTER — Other Ambulatory Visit: Payer: Self-pay | Admitting: *Deleted

## 2016-08-24 MED ORDER — BUPROPION HCL ER (XL) 300 MG PO TB24: 300.0000 mg | ORAL_TABLET | Freq: Every day | ORAL | 0 refills | Status: DC

## 2016-09-05 ENCOUNTER — Other Ambulatory Visit: Payer: Self-pay | Admitting: Family Medicine

## 2016-09-21 ENCOUNTER — Other Ambulatory Visit: Payer: Self-pay | Admitting: Family Medicine

## 2016-09-28 ENCOUNTER — Other Ambulatory Visit: Payer: Self-pay | Admitting: Family Medicine

## 2016-10-01 ENCOUNTER — Ambulatory Visit (INDEPENDENT_AMBULATORY_CARE_PROVIDER_SITE_OTHER): Payer: Managed Care, Other (non HMO) | Admitting: Family Medicine

## 2016-10-01 ENCOUNTER — Encounter: Payer: Self-pay | Admitting: Family Medicine

## 2016-10-01 VITALS — BP 109/61 | HR 90 | Ht 66.0 in | Wt 187.0 lb

## 2016-10-01 DIAGNOSIS — F3341 Major depressive disorder, recurrent, in partial remission: Secondary | ICD-10-CM

## 2016-10-01 MED ORDER — LEVOTHYROXINE SODIUM 75 MCG PO TABS: 75.0000 ug | ORAL_TABLET | Freq: Every day | ORAL | 6 refills | Status: DC

## 2016-10-01 MED ORDER — BUPROPION HCL ER (XL) 300 MG PO TB24: 300.0000 mg | ORAL_TABLET | Freq: Every day | ORAL | 3 refills | Status: DC

## 2016-10-01 NOTE — Progress Notes (Signed)
   Subjective:    Patient ID: Nancy Wang, female    DOB: March 29, 1971, 46 y.o.   MRN: [id number]  HPI   Here today for follow-up depression. We last addressed this issue about a year and a half ago. She is currently taking Wellbutrin and doing well with the medication. 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 It has been stressful but a good year. This anniversary of her father passing away the last couple weeks of been more stressful. She is happy with the Wellbutrin and wants to continue it.     Review of Systems     Objective:   Physical Exam  Constitutional: She is oriented to person, place, and time. She appears well-developed and well-nourished.  HENT:  Head: Normocephalic and atraumatic.  Cardiovascular: Normal rate, regular rhythm and normal heart sounds.   Pulmonary/Chest: Effort normal and breath sounds normal.  Neurological: She is alert and oriented to person, place, and time.  Skin: Skin is warm and dry.  Psychiatric: She has a normal mood and affect. Her behavior is normal.       Assessment & Plan:   Depression - PHQ 9 score of 9 today. Rates her symptoms is somewhat difficult. She says this is the anniversary of when her dad passed away so she says normally her mood is actually a little better but the last couple weeks have been more of a struggle emotionally. She is very happy with the Wellbutrin.Continue current regimen. Follow-up in one year. Next  I did strongly encourage her to schedule physical as she has not been in in quite some time. Her Pap smear is up-to-date but she is due for blood work as she had a borderline LDL level about 2-1/2 years ago.

## 2016-10-10 ENCOUNTER — Emergency Department (INDEPENDENT_AMBULATORY_CARE_PROVIDER_SITE_OTHER)
Admission: EM | Admit: 2016-10-10 | Discharge: 2016-10-10 | Disposition: A | Discharge status: Home / Self Care | Payer: Managed Care, Other (non HMO) | Source: Home / Self Care | Attending: Family Medicine | Admitting: Family Medicine

## 2016-10-10 DIAGNOSIS — J029 Acute pharyngitis, unspecified: Secondary | ICD-10-CM

## 2016-10-10 LAB — POCT RAPID STREP A (OFFICE): Rapid Strep A Screen: NEGATIVE

## 2016-10-10 NOTE — ED Provider Notes (Signed)
CSN: [id number]     Arrival date & time 10/10/16  1141 History   First MD Initiated Contact with Patient 10/10/16 1234     Chief Complaint  Patient presents with  . Sore Throat   (Consider location/radiation/quality/duration/timing/severity/associated sxs/prior Treatment) HPI Nancy Wang is a 46 y.o. female presenting to UC with c/o sore throat, 3/10 at worst, and generalized headache for 3 days.  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 Denies fever, chills, n/v/d. Minimal congestion w/o cough.    Past Medical History:  Diagnosis Date  . Bronchitis   . GERD (gastroesophageal reflux disease)   . Pseudogout    Dr. Ouida Sills  . RA (rheumatoid arthritis) ([place])    Follow by Tobie Lords, MD - [facility]  . Thyroid disease    hypothyroidism   Past Surgical History:  Procedure Laterality Date  . ABDOMINAL SURGERY    . arthroscopic surgery     both knees, RT 1985 removed patella, LT 1988  . ESSURE TUBAL LIGATION    . exploratory lap  1985, 2004   for ovarian cysts, infertility   Family History  Problem Relation Age of Onset  . Cancer Father     prostate  . Coronary artery disease Father   . Depression Father   . Hypertension Mother   . Diabetes Mother   . Diabetes      family history  . Hypertension      family history   Social History  Substance Use Topics  . Smoking status: Never Smoker  . Smokeless tobacco: Never Used  . Alcohol use Yes   OB History    No data available     Review of Systems  Constitutional: Negative for chills and fever.  HENT: Positive for congestion and sore throat. Negative for ear pain, trouble swallowing and voice change.   Respiratory: Negative for cough and shortness of breath.   Cardiovascular: Negative for chest pain and palpitations.  Gastrointestinal: Negative for abdominal pain, diarrhea, nausea and vomiting.  Musculoskeletal: Negative for arthralgias,  back pain and myalgias.  Skin: Negative for rash.  Neurological: Positive for headaches. Negative for dizziness and light-headedness.    Allergies  Citalopram and Morphine sulfate  Home Medications   Prior to Admission medications   Medication Sig Start Date End Date Taking? Authorizing Provider  buPROPion (WELLBUTRIN XL) 300 MG 24 hr tablet Take 1 tablet (300 mg total) by mouth daily. 10/01/16  Yes Hali Marry, MD  folic acid (FOLVITE) 1 MG tablet  12/19/15  Yes Historical Provider, MD  Golimumab ([place] ARIA IV) Inject into the vein.   Yes Historical Provider, MD  levothyroxine (SYNTHROID, LEVOTHROID) 75 MCG tablet Take 1 tablet (75 mcg total) by mouth daily before breakfast. 10/01/16  Yes Hali Marry, MD  methotrexate (RHEUMATREX) 2.5 MG tablet Take 2.5 mg by mouth once a week.  06/06/14  Yes Historical Provider, MD  omeprazole (PRILOSEC) 20 MG capsule Take 20 mg by mouth daily.     Yes Historical Provider, MD   Meds Ordered and Administered this Visit  Medications - No data to display  BP 118/79 (BP Location: Left Arm)   Pulse 70   Temp 98.2 F (36.8 C) (Oral)   Ht 5\' 6"  (1.676 m)   Wt 180 lb (81.6 kg)   SpO2 98%   BMI 29.05 kg/m  No data found.   Physical Exam  Constitutional: She appears well-developed  and well-nourished. No distress.  HENT:  Head: Normocephalic and atraumatic.  Right Ear: Tympanic membrane normal.  Left Ear: Tympanic membrane normal.  Nose: Nose normal.  Mouth/Throat: Uvula is midline and mucous membranes are normal. Posterior oropharyngeal erythema present. No oropharyngeal exudate, posterior oropharyngeal edema or tonsillar abscesses.  Eyes: Conjunctivae are normal. No scleral icterus.  Neck: Normal range of motion. Neck supple.  Cardiovascular: Normal rate, regular rhythm and normal heart sounds.   Pulmonary/Chest: Effort normal and breath sounds normal. No stridor. No respiratory distress. She has no wheezes. She has no rales.   Abdominal: Soft. Bowel sounds are normal.  Musculoskeletal: Normal range of motion.  Lymphadenopathy:    She has no cervical adenopathy.  Neurological: She is alert.  Skin: Skin is warm and dry. She is not diaphoretic.  Nursing note and vitals reviewed.   Urgent Care Course   Clinical Course     Procedures (including critical care time)  Labs Review Labs Reviewed  POCT RAPID STREP A (OFFICE) - Normal  STREP A DNA PROBE    Imaging Review No results found.    MDM   1. Pharyngitis, unspecified etiology    Pt c/o sore throat. Possible exposure to strep.  Rapid strep: Negative Will send culture.  Encouraged symptomatic treatment at this time.  F/u with PCP in 1 week if not improving.     Noland Fordyce, PA-C 10/10/16 1437

## 2016-10-10 NOTE — ED Triage Notes (Signed)
Patient c/o sore throat and headache. 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

## 2016-10-11 ENCOUNTER — Telehealth: Payer: Self-pay | Admitting: Emergency Medicine

## 2016-10-11 LAB — STREP A DNA PROBE: GASP: NOT DETECTED

## 2016-10-15 ENCOUNTER — Telehealth: Payer: Self-pay | Admitting: Emergency Medicine

## 2016-10-15 NOTE — Telephone Encounter (Signed)
Inquired about patient's status; encourage them to call with questions/concerns.  

## 2016-12-10 ENCOUNTER — Encounter: Payer: Managed Care, Other (non HMO) | Admitting: Family Medicine

## 2016-12-16 ENCOUNTER — Encounter: Payer: Managed Care, Other (non HMO) | Admitting: Family Medicine

## 2016-12-31 ENCOUNTER — Ambulatory Visit (INDEPENDENT_AMBULATORY_CARE_PROVIDER_SITE_OTHER): Payer: Managed Care, Other (non HMO) | Admitting: Family Medicine

## 2016-12-31 ENCOUNTER — Encounter: Payer: Self-pay | Admitting: Family Medicine

## 2016-12-31 VITALS — BP 103/61 | HR 84 | Ht 66.0 in | Wt 189.0 lb

## 2016-12-31 DIAGNOSIS — J029 Acute pharyngitis, unspecified: Secondary | ICD-10-CM | POA: Diagnosis not present

## 2016-12-31 NOTE — Progress Notes (Signed)
   Subjective:    Patient ID: Nancy Wang, female    DOB: 05/20/1971, 46 y.o.   MRN: [id number]  HPI 46 yo female like the inside of her throat is burning for 5 days  .  Mostly on the right side of her neck. occ  painful to press on her neck.  No fever.  + sneezing.  She denies any nasal congestion headaches or chest symptoms. She denies any trauma or irritation to the throat. No fevers chills or sweats. No worsening or alleviating factors.   Review of Systems     Objective:   Physical Exam  Constitutional: She is oriented to person, place, and time. She appears well-developed and well-nourished.  HENT:  Head: Normocephalic and atraumatic.  Right Ear: External ear normal.  Left Ear: External ear normal.  Nose: Nose normal.  Mouth/Throat: Oropharynx is clear and moist.  TMs and canals are clear.   Eyes: Conjunctivae and EOM are normal. Pupils are equal, round, and reactive to light.  Neck: Neck supple. No thyromegaly present.  Cardiovascular: Normal rate, regular rhythm and normal heart sounds.   Pulmonary/Chest: Effort normal and breath sounds normal. She has no wheezes.  Lymphadenopathy:    She has no cervical adenopathy.  Neurological: She is alert and oriented to person, place, and time.  Skin: Skin is warm and dry.  Psychiatric: She has a normal mood and affect.          Assessment & Plan:  Pharyngitis-suspect related to postnasal drip causing irritation in her throat. Recommend a trial salt water gargles and nasal steroid spray. The pollen in the local area has been quite high for about 2 weeks. She's also getting some frequent sneezing. If not better after 5-7 days then consider alternative diagnoses. Posterior pharynx is clear so no sign of strep throat.

## 2016-12-31 NOTE — Patient Instructions (Signed)
Recommend a trial of nasal spray, either Flonase or Nasonex.   Also try salt water gargles 2-3 times a day.  Hydrate well.

## 2017-04-12 ENCOUNTER — Encounter: Payer: Self-pay | Admitting: Emergency Medicine

## 2017-04-12 ENCOUNTER — Emergency Department (INDEPENDENT_AMBULATORY_CARE_PROVIDER_SITE_OTHER): Payer: Managed Care, Other (non HMO)

## 2017-04-12 ENCOUNTER — Emergency Department (INDEPENDENT_AMBULATORY_CARE_PROVIDER_SITE_OTHER)
Admission: EM | Admit: 2017-04-12 | Discharge: 2017-04-12 | Disposition: A | Discharge status: Home / Self Care | Payer: Managed Care, Other (non HMO) | Source: Home / Self Care | Attending: Family Medicine | Admitting: Family Medicine

## 2017-04-12 DIAGNOSIS — S90212A Contusion of left great toe with damage to nail, initial encounter: Secondary | ICD-10-CM | POA: Diagnosis not present

## 2017-04-12 DIAGNOSIS — W228XXA Striking against or struck by other objects, initial encounter: Secondary | ICD-10-CM | POA: Diagnosis not present

## 2017-04-12 DIAGNOSIS — S99922A Unspecified injury of left foot, initial encounter: Secondary | ICD-10-CM

## 2017-04-12 MED ORDER — KETOROLAC TROMETHAMINE 60 MG/2ML IM SOLN
60.0000 mg | Freq: Once | INTRAMUSCULAR | Status: AC
  Administered 2017-04-12: 60 mg via INTRAMUSCULAR

## 2017-04-12 MED ORDER — HYDROCODONE-ACETAMINOPHEN 5-325 MG PO TABS: 1.0000 | ORAL_TABLET | Freq: Four times a day (QID) | ORAL | 0 refills | Status: DC | PRN

## 2017-04-12 NOTE — ED Provider Notes (Signed)
Nancy Wang C[facility]    CSN: [id number] Arrival date & time: 04/12/17  1324     History   Chief Complaint Chief Complaint  Patient presents with  . Toe Injury    HPI Nancy Wang is a 46 y.o. female.   Patient bumped her left great toe tip on a stiff carpet about 2 hours ago, partly lifting the tip of her toenail.  Last Tdap wal 08/02/13.   The history is provided by the patient.  Toe Pain  This is a new problem. The current episode started 1 to 2 hours ago. The problem occurs constantly. The problem has not changed since onset.The symptoms are aggravated by walking. Nothing relieves the symptoms. She has tried nothing for the symptoms.    Past Medical History:  Diagnosis Date  . Bronchitis   . GERD (gastroesophageal reflux disease)   . Pseudogout    Dr. Ouida Sills  . RA (rheumatoid arthritis) ([place])    Follow by Tobie Lords, MD - [facility]  . Thyroid disease    hypothyroidism    Patient Active Problem List   Diagnosis Date Noted  . Hyperlipidemia 03/05/2015  . Insomnia 06/07/2014  . QT prolongation 08/02/2013  . UNSPECIFIED VITAMIN D DEFICIENCY 12/10/2010  . ALLERGIC RHINITIS 10/08/2010  . HYPOXEMIA 09/02/2010  . Depression 06/26/2010  . PSEUDOGOUT 05/26/2009  . RHEUMATOID ARTHRITIS 05/26/2009  . IRON, SERUM, ELEVATED 05/26/2009  . ELEVATED BLOOD PRESSURE 05/26/2009  . Hypothyroidism 03/31/2009  . KNEE PAIN, LEFT 03/31/2009    Past Surgical History:  Procedure Laterality Date  . ABDOMINAL SURGERY    . arthroscopic surgery     both knees, RT 1985 removed patella, LT 1988  . ESSURE TUBAL LIGATION    . exploratory lap  1985, 2004   for ovarian cysts, infertility    OB History    No data available       Home Medications    Prior to Admission medications   Medication Sig Start Date End Date Taking? Authorizing Provider  buPROPion (WELLBUTRIN XL) 300 MG 24 hr tablet Take 1 tablet (300 mg total) by mouth daily. 10/01/16    Hali Marry, MD  folic acid (FOLVITE) 1 MG tablet  12/19/15   [provider]  Golimumab ([place] ARIA IV) Inject into the vein.    [provider]  HYDROcodone-acetaminophen (NORCO/VICODIN) 5-325 MG tablet Take 1 tablet by mouth every 6 (six) hours as needed for moderate pain. 04/12/17   Kandra Nicolas, MD  levothyroxine (SYNTHROID, LEVOTHROID) 75 MCG tablet Take 1 tablet (75 mcg total) by mouth daily before breakfast. 10/01/16   Hali Marry, MD  methotrexate (RHEUMATREX) 2.5 MG tablet Take 2.5 mg by mouth once a week.  06/06/14   [provider]  omeprazole (PRILOSEC) 20 MG capsule Take 20 mg by mouth daily.      [provider]    Family History Family History  Problem Relation Age of Onset  . Cancer Father        prostate  . Coronary artery disease Father   . Depression Father   . Hypertension Mother   . Diabetes Mother   . Diabetes Unknown        family history  . Hypertension Unknown        family history    Social History Social History  Substance Use Topics  . Smoking status: Never Smoker  . Smokeless tobacco: Never Used  . Alcohol use Yes  Allergies   Citalopram and Morphine sulfate   Review of Systems Review of Systems  All other systems reviewed and are negative.    Physical Exam Triage Vital Signs ED Triage Vitals [04/12/17 1346]  Enc Vitals Group     BP 116/79     Pulse Rate 94     Resp      Temp 98.3 F (36.8 C)     Temp Source Oral     SpO2 98 %     Weight 185 lb (83.9 kg)     Height      Head Circumference      Peak Flow      Pain Score 8     Pain Loc      Pain Edu?      Excl. in [place]?    No data found.   Updated Vital Signs BP 116/79 (BP Location: Right Arm)   Pulse 94   Temp 98.3 F (36.8 C) (Oral)   Wt 185 lb (83.9 kg)   LMP 03/29/2017   SpO2 98%   BMI 29.86 kg/m   Visual Acuity Right Eye Distance:   Left Eye Distance:   Bilateral Distance:    Right Eye Near:     Left Eye Near:    Bilateral Near:     Physical Exam  Constitutional: She appears well-developed and well-nourished. No distress.  HENT:  Head: Atraumatic.  Eyes: Pupils are equal, round, and reactive to light.  Cardiovascular: Normal rate.   Pulmonary/Chest: Effort normal.  Musculoskeletal:       Left foot: There is decreased range of motion, tenderness and bony tenderness. There is no swelling, normal capillary refill, no deformity and no laceration.       Feet:  Tip of left great toe has tenderness to palpation.  Left great toe is slightly loose distally, and tender to palpation.  No subungual hematoma.  No laceraton.  No swelling of toe.  IP joint has full range of motion.  Neurological: She is alert.  Skin: Skin is warm and dry.  Nursing note and vitals reviewed.    UC Treatments / Results  Labs (all labs ordered are listed, but only abnormal results are displayed) Labs Reviewed - No data to display  EKG  EKG Interpretation None       Radiology Dg Toe Great Left  Result Date: 04/12/2017 CLINICAL DATA:  46 y/o  F; injury to tip of toe. EXAM: LEFT GREAT TOE COMPARISON:  None. FINDINGS: There is no evidence of fracture or dislocation. There is no evidence of arthropathy or other focal bone abnormality. Soft tissues are unremarkable. IMPRESSION: No acute fracture or dislocation identified. Electronically Signed   By: Kristine Garbe M.D.   On: 04/12/2017 14:38    Procedures Procedures  Left great toe cleaned with Betadine and saline.  Secured toenail in place with Dermabond.  Medications Ordered in UC Medications  ketorolac (TORADOL) injection 60 mg (60 mg Intramuscular Given 04/12/17 1421)     Initial Impression / Assessment and Plan / UC Course  I have reviewed the triage vital signs and the nursing notes.  Pertinent labs & imaging results that were available during my care of the patient were reviewed by me and considered in my medical decision making  (see chart for details).    Administered Toradol 60mg  IM  Rx for Lortab. Apply ice pack for 10 to 15 minutes, 3 to 4 times daily  Continue until pain and swelling decrease.  For  pain may take Ibuprofen 200mg , 4 tabs every 8 hours with food.    Follow instructions on Dermabond information sheet.  Followup with Family Doctor if not improved in one week.     Final Clinical Impressions(s) / UC Diagnoses   Final diagnoses:  Contusion of left great toe with damage to nail, initial encounter    New Prescriptions New Prescriptions   HYDROCODONE-ACETAMINOPHEN (NORCO/VICODIN) 5-325 MG TABLET    Take 1 tablet by mouth every 6 (six) hours as needed for moderate pain.     Kandra Nicolas, MD 04/12/17 [phone]

## 2017-04-12 NOTE — ED Triage Notes (Signed)
Pt states she hit her left big toe on a rug today. Toenail lifted up and toe is painful.

## 2017-04-12 NOTE — Discharge Instructions (Signed)
Apply ice pack for 10 to 15 minutes, 3 to 4 times daily  Continue until pain and swelling decrease.  For pain may take Ibuprofen 200mg , 4 tabs every 8 hours with food.    Follow instructions on Dermabond information sheet.

## 2017-04-23 ENCOUNTER — Emergency Department (INDEPENDENT_AMBULATORY_CARE_PROVIDER_SITE_OTHER)
Admission: EM | Admit: 2017-04-23 | Discharge: 2017-04-23 | Disposition: A | Discharge status: Home / Self Care | Payer: Managed Care, Other (non HMO) | Source: Home / Self Care | Attending: Family Medicine | Admitting: Family Medicine

## 2017-04-23 ENCOUNTER — Encounter: Payer: Self-pay | Admitting: Emergency Medicine

## 2017-04-23 DIAGNOSIS — M79675 Pain in left toe(s): Secondary | ICD-10-CM

## 2017-04-23 NOTE — ED Triage Notes (Signed)
Patient here for a follow up from a toenail injury over a week ago, still draining, still painful, seen Dr Assunta Found, not much improvement.

## 2017-04-23 NOTE — ED Provider Notes (Signed)
CSN: [id number]     Arrival date & time 04/23/17  1004 History   First MD Initiated Contact with Patient 04/23/17 1107     Chief Complaint  Patient presents with  . Nail Problem   (Consider location/radiation/quality/duration/timing/severity/associated sxs/prior Treatment) HPI  Nancy Wang is a 46 y.o. female presenting to UC with c/o continued Left great toe pain after nailbed injury that occurred on 04/12/17.  She was seen at [facility] and had plain films- negative for fracture or dislocation.  During that visit pt did have dermabond applied around nail to help keep it from coming off.  Pt has not soaked or washed toe as she was advised not to.  She takes Ibuprofen during the day and hydrocodone at night. Pain is aching and sore, 5/10 worse with movement. No new injuries.  Denies fever or chills.      Past Medical History:  Diagnosis Date  . Bronchitis   . GERD (gastroesophageal reflux disease)   . Pseudogout    Dr. Ouida Sills  . RA (rheumatoid arthritis) ([place])    Follow by Tobie Lords, MD - [facility]  . Thyroid disease    hypothyroidism   Past Surgical History:  Procedure Laterality Date  . ABDOMINAL SURGERY    . arthroscopic surgery     both knees, RT 1985 removed patella, LT 1988  . ESSURE TUBAL LIGATION    . exploratory lap  1985, 2004   for ovarian cysts, infertility   Family History  Problem Relation Age of Onset  . Cancer Father        prostate  . Coronary artery disease Father   . Depression Father   . Hypertension Mother   . Diabetes Mother   . Diabetes Unknown        family history  . Hypertension Unknown        family history   Social History  Substance Use Topics  . Smoking status: Never Smoker  . Smokeless tobacco: Never Used  . Alcohol use Yes   OB History    No data available     Review of Systems  Musculoskeletal: Positive for arthralgias. Negative for joint swelling.  Skin: Negative for color change and wound.     Allergies  Citalopram and Morphine sulfate  Home Medications   Prior to Admission medications   Medication Sig Start Date End Date Taking? Authorizing Provider  buPROPion (WELLBUTRIN XL) 300 MG 24 hr tablet Take 1 tablet (300 mg total) by mouth daily. 10/01/16   Hali Marry, MD  folic acid (FOLVITE) 1 MG tablet  12/19/15   [provider]  Golimumab ([place] ARIA IV) Inject into the vein.    [provider]  HYDROcodone-acetaminophen (NORCO/VICODIN) 5-325 MG tablet Take 1 tablet by mouth every 6 (six) hours as needed for moderate pain. 04/12/17   Kandra Nicolas, MD  levothyroxine (SYNTHROID, LEVOTHROID) 75 MCG tablet Take 1 tablet (75 mcg total) by mouth daily before breakfast. 10/01/16   Hali Marry, MD  methotrexate (RHEUMATREX) 2.5 MG tablet Take 2.5 mg by mouth once a week.  06/06/14   [provider]  omeprazole (PRILOSEC) 20 MG capsule Take 20 mg by mouth daily.      [provider]   Meds Ordered and Administered this Visit  Medications - No data to display  BP 111/75 (BP Location: Left Arm)   Pulse 84   Temp 98.7 F (37.1 C) (Oral)   Ht 5\' 6"  (1.676 m)  Wt 189 lb (85.7 kg)   LMP 04/22/2017 (Exact Date)   SpO2 99%   BMI 30.51 kg/m  No data found.   Physical Exam  Constitutional: She is oriented to person, place, and time. She appears well-developed and well-nourished. No distress.  HENT:  Head: Normocephalic and atraumatic.  Eyes: EOM are normal.  Neck: Normal range of motion.  Cardiovascular: Normal rate.   Pulmonary/Chest: Effort normal.  Musculoskeletal: Normal range of motion. She exhibits tenderness. She exhibits no edema.  Left great toe: no obvious edema. Tenderness over nail. Full ROM. No tenderness to plantar aspect of toe.  Neurological: She is alert and oriented to person, place, and time.  Skin: Skin is warm and dry. She is not diaphoretic.  Left great toe: nail in place with scant amount of  dermabond still in place. No drainage or bleeding. No erythema or warmth.  Pt has nail polish in place. Unable to see if subungual hematoma is present.   Psychiatric: She has a normal mood and affect. Her behavior is normal.  Nursing note and vitals reviewed.   Urgent Care Course     Procedures (including critical care time)  Labs Review Labs Reviewed - No data to display  Imaging Review No results found.    MDM   1. Great toe pain, left    Notes from prior visit reviewed including imaging.  Pt did not have subungual hematoma at that time. Pt has nail polish in place today, but pt is 1 week out, if subungual hematoma present, trephination would likely not be of benefit due to age of injury.   No evidence of underlying infection at this time.  Encouraged to soak toe in warm water and epson walt 2-3 times daily for 15-20 minutes at a time. F/u with Sports Medicine or Podiatry later this week if not improving.    Noe Gens, [place] 04/23/17 1243

## 2017-04-23 NOTE — Discharge Instructions (Signed)
° °  You may try to soak your toe in warm water and epson salt 2-3 times daily for 15-20 minutes at a time.  This will help get rid of any remaining dried blood and dried glue and allow nail to continue to grow outward.  If you did develop a subungual hematoma (blood under the nail) it will eventually resolve with time.  See more info in this packet.  If not improving in 1 week, you may follow up with Sports Medicine or Podiatrist for further evaluation and treatment.

## 2017-04-27 ENCOUNTER — Other Ambulatory Visit: Payer: Self-pay | Admitting: Family Medicine

## 2017-05-17 ENCOUNTER — Other Ambulatory Visit: Payer: Self-pay | Admitting: Family Medicine

## 2017-05-28 ENCOUNTER — Other Ambulatory Visit: Payer: Self-pay | Admitting: Family Medicine

## 2017-05-31 ENCOUNTER — Telehealth: Payer: Self-pay | Admitting: Family Medicine

## 2017-05-31 NOTE — Telephone Encounter (Signed)
I called pt and left a message stating to call and schedule a F/u Appt with Dr.Metheney She is due for a F/u on Thyroid

## 2017-06-03 ENCOUNTER — Other Ambulatory Visit: Payer: Self-pay | Admitting: *Deleted

## 2017-06-03 MED ORDER — BUPROPION HCL ER (XL) 300 MG PO TB24: 300.0000 mg | ORAL_TABLET | Freq: Every day | ORAL | 0 refills | Status: DC

## 2017-06-07 ENCOUNTER — Ambulatory Visit: Payer: Managed Care, Other (non HMO) | Admitting: Family Medicine

## 2017-06-10 ENCOUNTER — Ambulatory Visit: Payer: Managed Care, Other (non HMO) | Admitting: Family Medicine

## 2017-06-13 ENCOUNTER — Other Ambulatory Visit: Payer: Self-pay | Admitting: *Deleted

## 2017-06-13 MED ORDER — BUPROPION HCL ER (XL) 300 MG PO TB24
300.0000 mg | ORAL_TABLET | Freq: Every day | ORAL | 0 refills | Status: DC
Start: 2017-06-13 — End: 2018-04-19

## 2017-06-21 ENCOUNTER — Encounter: Payer: Self-pay | Admitting: Family Medicine

## 2017-06-21 ENCOUNTER — Ambulatory Visit (INDEPENDENT_AMBULATORY_CARE_PROVIDER_SITE_OTHER): Payer: Managed Care, Other (non HMO) | Admitting: Family Medicine

## 2017-06-21 VITALS — BP 124/71 | HR 115 | Temp 98.8°F | Ht 66.0 in | Wt 185.0 lb

## 2017-06-21 DIAGNOSIS — R232 Flushing: Secondary | ICD-10-CM

## 2017-06-21 DIAGNOSIS — R05 Cough: Secondary | ICD-10-CM

## 2017-06-21 DIAGNOSIS — R059 Cough, unspecified: Secondary | ICD-10-CM

## 2017-06-21 DIAGNOSIS — E039 Hypothyroidism, unspecified: Secondary | ICD-10-CM

## 2017-06-21 DIAGNOSIS — J069 Acute upper respiratory infection, unspecified: Secondary | ICD-10-CM | POA: Diagnosis not present

## 2017-06-21 DIAGNOSIS — F3341 Major depressive disorder, recurrent, in partial remission: Secondary | ICD-10-CM

## 2017-06-21 NOTE — Progress Notes (Signed)
Subjective:    CC: Thyroid, cough, mood   HPI:  Hypothyroid -  She is doing well and taking her medication regularly. She hasn't had labs in 3 years.  She denies any recent skin or hair changes. No significant weight changes. Lab Results  Component Value Date   TSH 3.525 06/07/2014     Started coughing last nightit is mostly a dry cough. A little mild sore throat. Some mild nasal congestion and feeling a little tightness in her chest. She does have a prior history of asthma but hasn't felt like she's new to use her albuterol. No fevers or chills she has had some sweats.  F/U depression - she has been more sad and tearful and emotional lately. Not sure if her hormones or her mood. She is currently on Wellubtrin and has been for some time.  No S.E. On the medications.   She has also been having hot flashes and would like to have her female hormeson checked. She is getting her period mor frequently now, about every 3 weeks.    Past medical history, Surgical history, Family history not pertinant except as noted below, Social history, Allergies, and medications have been entered into the medical record, reviewed, and corrections made.   Review of Systems: No fevers, chills, night sweats, weight loss, chest pain, or shortness of breath.   Objective:    General: Well Developed, well nourished, and in no acute distress.  Neuro: Alert and oriented x3, extra-ocular muscles intact, sensation grossly intact.  HEENT: Normocephalic, atraumatic  Skin: Warm and dry, no rashes. Cardiac: Regular rate and rhythm, no murmurs rubs or gallops, no lower extremity edema.  Respiratory: Clear to auscultation bilaterally. Not using accessory muscles, speaking in full sentences.   Impression and Recommendations:    Depression - PHQ- 9 score of 11.  Uncontrolled. Will check her thyroid first. If not improving then please let me know. If thyroid is normal and then consider starting additional medication.  Discuss that certainly is an option. She can think about it and let me know what she would like to do.  Hypothyroid - recheck TSH. Make adjustments if needed. If not then okay to refill medications for the next year.  Hot flashes-we'll check hormone levels per her request but she still having. So if anything she is likely just perimenopausal. The main treatment for that is working on regular exercise, maintaining a healthy weight and getting adequate sleep. Though could consider Effexor to help alleviate the intensity of her symptoms.  URI - gave reassurance. Likely viral. Call if not better by the end of the week.

## 2017-06-22 LAB — COMPLETE METABOLIC PANEL WITH GFR
AG Ratio: 1.6 (calc) (ref 1.0–2.5)
ALT: 14 U/L (ref 6–29)
AST: 11 U/L (ref 10–35)
Albumin: 4.1 g/dL (ref 3.6–5.1)
Alkaline phosphatase (APISO): 61 U/L (ref 33–115)
BUN: 14 mg/dL (ref 7–25)
CALCIUM: 8.9 mg/dL (ref 8.6–10.2)
CO2: 28 mmol/L (ref 20–32)
CREATININE: 0.91 mg/dL (ref 0.50–1.10)
Chloride: 103 mmol/L (ref 98–110)
GFR, EST NON AFRICAN AMERICAN: 76 mL/min/{1.73_m2} (ref >=60)
GFR, Est African American: 88 mL/min/{1.73_m2} (ref >=60)
GLUCOSE: 110 mg/dL — AB (ref 65–99)
Globulin: 2.5 g/dL (calc) (ref 1.9–3.7)
Potassium: 3.9 mmol/L (ref 3.5–5.3)
Sodium: 139 mmol/L (ref 135–146)
Total Bilirubin: 0.8 mg/dL (ref 0.2–1.2)
Total Protein: 6.6 g/dL (ref 6.1–8.1)

## 2017-06-22 LAB — CBC WITH DIFFERENTIAL/PLATELET
BASOS PCT: 0.4 %
Basophils Absolute: 53 cells/uL (ref 0–200)
EOS ABS: 173 {cells}/uL (ref 15–500)
Eosinophils Relative: 1.3 %
HCT: 40.2 % (ref 35.0–45.0)
HEMOGLOBIN: 13.6 g/dL (ref 11.7–15.5)
Lymphs Abs: 2687 cells/uL (ref 850–3900)
MCH: 32.2 pg (ref 27.0–33.0)
MCHC: 33.8 g/dL (ref 32.0–36.0)
MCV: 95 fL (ref 80.0–100.0)
MPV: 10.4 fL (ref 7.5–12.5)
Monocytes Relative: 7.8 %
NEUTROS ABS: 9350 {cells}/uL — AB (ref 1500–7800)
Neutrophils Relative %: 70.3 %
Platelets: 296 10*3/uL (ref 140–400)
RBC: 4.23 10*6/uL (ref 3.80–5.10)
RDW: 12.9 % (ref 11.0–15.0)
Total Lymphocyte: 20.2 %
WBC: 13.3 10*3/uL — ABNORMAL HIGH (ref 3.8–10.8)
WBCMIX: 1037 {cells}/uL — AB (ref 200–950)

## 2017-06-22 LAB — FOLLICLE STIMULATING HORMONE: FSH: 6.7 m[IU]/mL

## 2017-06-22 LAB — LUTEINIZING HORMONE: LH: 4.1 m[IU]/mL

## 2017-06-22 LAB — TSH: TSH: 2.93 m[IU]/L

## 2017-06-22 LAB — ESTRADIOL: Estradiol: 81 pg/mL

## 2017-06-22 LAB — PROGESTERONE: PROGESTERONE: 7.3 ng/mL

## 2017-07-10 ENCOUNTER — Other Ambulatory Visit: Payer: Self-pay | Admitting: Family Medicine

## 2017-08-30 ENCOUNTER — Ambulatory Visit (INDEPENDENT_AMBULATORY_CARE_PROVIDER_SITE_OTHER): Payer: Managed Care, Other (non HMO) | Admitting: Family Medicine

## 2017-08-30 ENCOUNTER — Encounter: Payer: Self-pay | Admitting: Family Medicine

## 2017-08-30 VITALS — BP 137/79 | HR 97 | Ht 66.0 in | Wt 183.0 lb

## 2017-08-30 DIAGNOSIS — Z23 Encounter for immunization: Secondary | ICD-10-CM

## 2017-08-30 DIAGNOSIS — F321 Major depressive disorder, single episode, moderate: Secondary | ICD-10-CM | POA: Diagnosis not present

## 2017-08-30 DIAGNOSIS — K219 Gastro-esophageal reflux disease without esophagitis: Secondary | ICD-10-CM

## 2017-08-30 DIAGNOSIS — N912 Amenorrhea, unspecified: Secondary | ICD-10-CM

## 2017-08-30 MED ORDER — OMEPRAZOLE 20 MG PO CPDR: 20.0000 mg | DELAYED_RELEASE_CAPSULE | Freq: Every day | ORAL | 3 refills | Status: DC

## 2017-08-30 MED ORDER — DULOXETINE HCL 30 MG PO CPEP: 30.0000 mg | ORAL_CAPSULE | Freq: Every day | ORAL | 0 refills | Status: DC

## 2017-08-30 NOTE — Progress Notes (Signed)
Subjective:    Patient ID: Nancy Wang, female    DOB: Jan 08, 1971, 46 y.o.   MRN: [id number]  HPI 2 Month follow-up for depressive type symptoms-when she came back in September she discussed feeling more tearful and sad.  At that time we checked some blood work just to rule out some more hormonal type causes.  Labs were all normal.  She is currently on Wellbutrin.  He still just feels very tearful and sad.  Part of it is that she feels like she can no longer do her current job.  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  She just recently was able to discuss this with her best friend who is a Systems analyst and with her husband.   He also wanted to let me know that she has not had her period in almost 9 weeks.  Typically she is actually on an every 3-week cycle but around the time that I last saw her she has not had a but she says she feels like she is been PMS and the entire time most like she is going to have a period but then never bleeds.  She was started on Colcrys about 8 weeks ago and is also on Simponi for her rheumatoid arthritis.   Review of Systems  BP 137/79   Pulse 97   Ht 5\' 6"  (1.676 m)   Wt 183 lb (83 kg)   SpO2 100%   BMI 29.54 kg/m     Allergies  Allergen Reactions  . Citalopram Other (See Comments)    QT prolongation  . Morphine Sulfate     Past Medical History:  Diagnosis Date  . Bronchitis   . GERD (gastroesophageal reflux disease)   . Pseudogout    Dr. Ouida Sills  . RA (rheumatoid arthritis) ([place])    Follow by Tobie Lords, MD - [facility]  . Thyroid  disease    hypothyroidism    Past Surgical History:  Procedure Laterality Date  . ABDOMINAL SURGERY    . arthroscopic surgery     both knees, RT 1985 removed patella, LT 1988  . ESSURE TUBAL LIGATION    . exploratory lap  1985, 2004   for ovarian cysts, infertility    Social History   Socioeconomic History  . Marital status: Married    Spouse name: Not on file  . Number of children: Not on file  . Years of education: Not on file  . Highest education level: Not on file  Social Needs  . Financial resource strain: Not on file  . Food insecurity - worry: Not on file  . Food insecurity - inability: Not on file  . Transportation needs - medical: Not on file  . Transportation needs - non-medical: Not on file  Occupational History  . Not on file  Tobacco Use  . Smoking status: Never Smoker  . Smokeless tobacco: Never Used  Substance and Sexual Activity  . Alcohol use: Yes  . Drug use: No  . Sexual activity: Not on file  Comment: married, no regular exercise, limited by knee pain, has son.   Other Topics Concern  . Not on file  Social History Narrative  . Not on file    Family History  Problem Relation Age of Onset  . Cancer Father        prostate  . Coronary artery disease Father   . Depression Father   . Hypertension Mother   . Diabetes Mother   . Diabetes Unknown        family history  . Hypertension Unknown        family history    Outpatient Encounter Medications as of 08/30/2017  Medication Sig  . buPROPion (WELLBUTRIN XL) 300 MG 24 hr tablet Take 1 tablet (300 mg total) by mouth daily.  Marland Kitchen COLCRYS 0.6 MG tablet Take 0.6 mg by mouth daily.  . folic acid (FOLVITE) 1 MG tablet   . Golimumab ([place] ARIA IV) Inject into the vein.  Marland Kitchen levothyroxine (SYNTHROID, LEVOTHROID) 75 MCG tablet TAKE 1 TABLET BY MOUTH EVERY DAY BEFORE BREAKFAST  . methotrexate (RHEUMATREX) 2.5 MG tablet Take 2.5 mg by mouth once a week.   Marland Kitchen omeprazole (PRILOSEC) 20 MG capsule Take 1  capsule (20 mg total) by mouth daily.  . [DISCONTINUED] omeprazole (PRILOSEC) 20 MG capsule Take 20 mg by mouth daily.    . DULoxetine (CYMBALTA) 30 MG capsule Take 1 capsule (30 mg total) by mouth daily. Ok to increase to BID after 2 weeks.   No facility-administered encounter medications on file as of 08/30/2017.           Objective:   Physical Exam  Constitutional: She is oriented to person, place, and time. She appears well-developed and well-nourished.  HENT:  Head: Normocephalic and atraumatic.  Cardiovascular: Normal rate, regular rhythm and normal heart sounds.  Pulmonary/Chest: Effort normal and breath sounds normal.  Neurological: She is alert and oriented to person, place, and time.  Skin: Skin is warm and dry.  Psychiatric: She has a normal mood and affect. Her behavior is normal.          Assessment & Plan:  Depression -part of this is situational and that she feels like she can no longer do her job which she has worked very hard to get to in life.  We discussed referral to a therapist/counselor but she declined at this time but says she will think about it.  Also recommended that she could certainly check into [employer] through work which could provide some free therapy/counseling.  In the meantime I think we need to go ahead and start a medication in addition to her Wellbutrin so we will start Cymbalta.  Okay to increase to 2 tabs daily if needed after 2 weeks.  Follow-up in 4 weeks.  Okay to continue Wellbutrin and we can always look at discontinuing it at some point if she is doing well with the Cymbalta.  Amenorrhea-we will recheck hormones.  She was actually having her menstrual cycle about 9 weeks ago when we did her blood work.  Also consider pelvic ultrasound if needed.  We will follow-up when I see her again in 4 weeks if hormone tests are normal.  We will also check on her current medications to see if this could be a potential side effect.  I am not aware of  this.  Time spent 30 minutes, greater than 50% the time spent counseling about depression and amenorrhea.

## 2017-08-31 LAB — ESTRADIOL: ESTRADIOL: 120 pg/mL

## 2017-08-31 LAB — FOLLICLE STIMULATING HORMONE: FSH: 3.2 m[IU]/mL

## 2017-08-31 LAB — PROGESTERONE: PROGESTERONE: 6.5 ng/mL

## 2017-08-31 LAB — LUTEINIZING HORMONE: LH: 2.7 m[IU]/mL

## 2017-09-01 ENCOUNTER — Other Ambulatory Visit: Payer: Self-pay | Admitting: *Deleted

## 2017-09-01 MED ORDER — DULOXETINE HCL 60 MG PO CPEP: 60.0000 mg | ORAL_CAPSULE | Freq: Every day | ORAL | 0 refills | Status: DC

## 2017-09-01 MED ORDER — DULOXETINE HCL 30 MG PO CPEP: 30.0000 mg | ORAL_CAPSULE | Freq: Every day | ORAL | 0 refills | Status: DC

## 2017-09-23 ENCOUNTER — Ambulatory Visit (INDEPENDENT_AMBULATORY_CARE_PROVIDER_SITE_OTHER): Payer: Managed Care, Other (non HMO) | Admitting: Family Medicine

## 2017-09-23 ENCOUNTER — Encounter: Payer: Self-pay | Admitting: Family Medicine

## 2017-09-23 VITALS — BP 118/64 | HR 85 | Ht 66.0 in | Wt 177.0 lb

## 2017-09-23 DIAGNOSIS — F321 Major depressive disorder, single episode, moderate: Secondary | ICD-10-CM | POA: Diagnosis not present

## 2017-09-23 DIAGNOSIS — N912 Amenorrhea, unspecified: Secondary | ICD-10-CM | POA: Diagnosis not present

## 2017-09-23 MED ORDER — DULOXETINE HCL 30 MG PO CPEP: 30.0000 mg | ORAL_CAPSULE | Freq: Every day | ORAL | 0 refills | Status: DC

## 2017-09-23 NOTE — Progress Notes (Signed)
   Subjective:    Patient ID: Nancy Wang, female    DOB: January 08, 1971, 46 y.o.   MRN: [id number]  HPI 46 year old female is here today to follow-up for depression.  She was getting to the point where she felt like she was not doing a good job at work and she is a Pharmacist, hospital.  She felt like she had brain fog was having hard time focusing.  Is Artie on Wellbutrin at the time we decided to start Cymbalta.  She says initially she was extremely nauseated so she never went up to the 60 mg she just stated 30 mg tab.  This seems to have gotten a little bit better.  She is sleeping well over the last couple of nights.  And feeling like some of the heaviness/cloud his lifted a little bit.  Right now she is been on vacation and she is also been dealing with grieving for the loss of her father.  This is her second Christmas without him and he passed away on 10/23/22 so this is always a little bit of a difficult time a year for her.  Review is PHQ 9 score of 20.  She finally ended up having a menstrual cycle a couple of days after I saw her but it was very short and only lasted 3 days it was very light with just spotting.   Review of Systems     Objective:   Physical Exam  Constitutional: She is oriented to person, place, and time. She appears well-developed and well-nourished.  HENT:  Head: Normocephalic and atraumatic.  Cardiovascular: Normal rate, regular rhythm and normal heart sounds.  Pulmonary/Chest: Effort normal and breath sounds normal.  Neurological: She is alert and oriented to person, place, and time.  Skin: Skin is warm and dry.  Psychiatric: She has a normal mood and affect. Her behavior is normal.      Assessment & Plan:  Depression-currently on Cymbalta 30 mg.  PHQ 9 score of 7 today down from 10. Greater than 50-% improvement which is great.  Monitor for the nausea. Will continue with cymbalta 30mg  and f/U in 6 weeks.  Call if feels we need to increase dose to 60mg  after she goes  back to work.    Irregular period - call and will order [place] if period doesn't reset.

## 2017-10-12 ENCOUNTER — Encounter: Payer: Self-pay | Admitting: Physician Assistant

## 2017-10-12 ENCOUNTER — Ambulatory Visit (INDEPENDENT_AMBULATORY_CARE_PROVIDER_SITE_OTHER): Payer: Managed Care, Other (non HMO) | Admitting: Physician Assistant

## 2017-10-12 VITALS — BP 121/61 | HR 100 | Temp 98.4°F | Ht 66.0 in | Wt 176.0 lb

## 2017-10-12 DIAGNOSIS — J040 Acute laryngitis: Secondary | ICD-10-CM | POA: Diagnosis not present

## 2017-10-12 DIAGNOSIS — J209 Acute bronchitis, unspecified: Secondary | ICD-10-CM | POA: Diagnosis not present

## 2017-10-12 DIAGNOSIS — J069 Acute upper respiratory infection, unspecified: Secondary | ICD-10-CM | POA: Diagnosis not present

## 2017-10-12 DIAGNOSIS — B9789 Other viral agents as the cause of diseases classified elsewhere: Secondary | ICD-10-CM | POA: Diagnosis not present

## 2017-10-12 MED ORDER — IPRATROPIUM-ALBUTEROL 0.5-2.5 (3) MG/3ML IN SOLN
3.0000 mL | Freq: Once | RESPIRATORY_TRACT | Status: AC
  Administered 2017-10-12: 3 mL via RESPIRATORY_TRACT

## 2017-10-12 MED ORDER — AZITHROMYCIN 250 MG PO TABS
ORAL_TABLET | ORAL | 0 refills | Status: DC
Start: 2017-10-12 — End: 2017-10-14

## 2017-10-12 MED ORDER — IPRATROPIUM BROMIDE 0.06 % NA SOLN: 2.0000 | Freq: Four times a day (QID) | NASAL | 1 refills | Status: DC

## 2017-10-12 MED ORDER — HYDROCODONE-HOMATROPINE 5-1.5 MG/5ML PO SYRP: 5.0000 mL | ORAL_SOLUTION | Freq: Two times a day (BID) | ORAL | 0 refills | Status: DC | PRN

## 2017-10-12 MED ORDER — METHYLPREDNISOLONE SODIUM SUCC 125 MG IJ SOLR
125.0000 mg | Freq: Once | INTRAMUSCULAR | Status: AC
  Administered 2017-10-12: 125 mg via INTRAMUSCULAR

## 2017-10-12 NOTE — Patient Instructions (Addendum)
Laryngitis Laryngitis is swelling (inflammation) of your vocal cords. This causes hoarseness, coughing, loss of voice, sore throat, or a dry throat. When your vocal cords are inflamed, your voice sounds different. Laryngitis can be temporary (acute) or long-term (chronic). Most cases of acute laryngitis improve with time. Chronic laryngitis is laryngitis that lasts for more than three weeks. Follow these instructions at home:  Drink enough fluid to keep your pee (urine) clear or pale yellow.  Breathe in moist air. Use a humidifier if you live in a dry climate.  Take medicines only as told by your doctor.  Do not smoke cigarettes or electronic cigarettes. If you need help quitting, ask your doctor.  Talk as little as possible. Also avoid whispering, which can cause vocal strain.  Write instead of talking. Do this until your voice is back to normal. Contact a doctor if:  You have a fever.  Your pain is worse.  You have trouble swallowing. Get help right away if:  You cough up blood.  You have trouble breathing. This information is not intended to replace advice given to you by your health care provider. Make sure you discuss any questions you have with your health care provider. Document Released: 09/02/2011 Document Revised: 02/19/2016 Document Reviewed: 02/26/2014 Elsevier Interactive Patient Education  2018 [employer]. Acute Bronchitis, Adult Acute bronchitis is when air tubes (bronchi) in the lungs suddenly get swollen. The condition can make it hard to breathe. It can also cause these symptoms:  A cough.  Coughing up clear, yellow, or green mucus.  Wheezing.  Chest congestion.  Shortness of breath.  A fever.  Body aches.  Chills.  A sore throat.  Follow these instructions at home: Medicines  Take over-the-counter and prescription medicines only as told by your doctor.  If you were prescribed an antibiotic medicine, take it as told by your doctor. Do not  stop taking the antibiotic even if you start to feel better. General instructions  Rest.  Drink enough fluids to keep your pee (urine) clear or pale yellow.  Avoid smoking and secondhand smoke. If you smoke and you need help quitting, ask your doctor. Quitting will help your lungs heal faster.  Use an inhaler, cool mist vaporizer, or humidifier as told by your doctor.  Keep all follow-up visits as told by your doctor. This is important. How is this prevented? To lower your risk of getting this condition again:  Wash your hands often with soap and water. If you cannot use soap and water, use hand sanitizer.  Avoid contact with people who have cold symptoms.  Try not to touch your hands to your mouth, nose, or eyes.  Make sure to get the flu shot every year.  Contact a doctor if:  Your symptoms do not get better in 2 weeks. Get help right away if:  You cough up blood.  You have chest pain.  You have very bad shortness of breath.  You become dehydrated.  You faint (pass out) or keep feeling like you are going to pass out.  You keep throwing up (vomiting).  You have a very bad headache.  Your fever or chills gets worse. This information is not intended to replace advice given to you by your health care provider. Make sure you discuss any questions you have with your health care provider. Document Released: 03/01/2008 Document Revised: 04/21/2016 Document Reviewed: 03/03/2016 Elsevier Interactive Patient Education  2018 [employer].  Acute Bronchitis, Adult Acute bronchitis is when air tubes (  bronchi) in the lungs suddenly get swollen. The condition can make it hard to breathe. It can also cause these symptoms:  A cough.  Coughing up clear, yellow, or green mucus.  Wheezing.  Chest congestion.  Shortness of breath.  A fever.  Body aches.  Chills.  A sore throat.  Follow these instructions at home: Medicines  Take over-the-counter and prescription  medicines only as told by your doctor.  If you were prescribed an antibiotic medicine, take it as told by your doctor. Do not stop taking the antibiotic even if you start to feel better. General instructions  Rest.  Drink enough fluids to keep your pee (urine) clear or pale yellow.  Avoid smoking and secondhand smoke. If you smoke and you need help quitting, ask your doctor. Quitting will help your lungs heal faster.  Use an inhaler, cool mist vaporizer, or humidifier as told by your doctor.  Keep all follow-up visits as told by your doctor. This is important. How is this prevented? To lower your risk of getting this condition again:  Wash your hands often with soap and water. If you cannot use soap and water, use hand sanitizer.  Avoid contact with people who have cold symptoms.  Try not to touch your hands to your mouth, nose, or eyes.  Make sure to get the flu shot every year.  Contact a doctor if:  Your symptoms do not get better in 2 weeks. Get help right away if:  You cough up blood.  You have chest pain.  You have very bad shortness of breath.  You become dehydrated.  You faint (pass out) or keep feeling like you are going to pass out.  You keep throwing up (vomiting).  You have a very bad headache.  Your fever or chills gets worse. This information is not intended to replace advice given to you by your health care provider. Make sure you discuss any questions you have with your health care provider. Document Released: 03/01/2008 Document Revised: 04/21/2016 Document Reviewed: 03/03/2016 Elsevier Interactive Patient Education  [employer].

## 2017-10-12 NOTE — Progress Notes (Addendum)
Subjective:    Patient ID: Nancy Wang, female    DOB: 1970/11/06, 47 y.o.   MRN: [id number]  HPI  Patient is a 47 year old female with a history of rheumatoid arthritis and on immunosuppressing medication presenting with a cough that began yesterday (10/11/2017). She reports hoarsness, dry persistent cough with occasional sputum, headaches, and sore throat ,her coughing has interfered with her sleep and made it difficult last night. She is on Simponi and methotrexate which contributes to medical immunosuppression. She reports similar symptoms in the past after sore throat that often progresses to bronchitis. No fever, chills. She does have some chest tightness and SOB.   Marland Kitchen. Active Ambulatory Problems    Diagnosis Date Noted  . Hypothyroidism 03/31/2009  . PSEUDOGOUT 05/26/2009  . Depression 06/26/2010  . RHEUMATOID ARTHRITIS 05/26/2009  . KNEE PAIN, LEFT 03/31/2009  . IRON, SERUM, ELEVATED 05/26/2009  . ELEVATED BLOOD PRESSURE 05/26/2009  . HYPOXEMIA 09/02/2010  . ALLERGIC RHINITIS 10/08/2010  . UNSPECIFIED VITAMIN D DEFICIENCY 12/10/2010  . QT prolongation 08/02/2013  . Insomnia 06/07/2014  . Hyperlipidemia 03/05/2015   Resolved Ambulatory Problems    Diagnosis Date Noted  . VIRAL URI 08/25/2010  . PNEUMONIA, ATYPICAL 09/02/2010  . Wheezing 09/02/2010  . Cough 09/02/2010  . Atypical pneumonia 05/07/2011  . Acute bronchitis 04/30/2014   Past Medical History:  Diagnosis Date  . Bronchitis   . GERD (gastroesophageal reflux disease)   . Pseudogout   . RA (rheumatoid arthritis) ([place])   . Thyroid disease       Review of Systems  Constitutional: Positive for chills. Negative for fatigue and fever.  HENT: Positive for congestion and sore throat. Negative for sinus pressure and sinus pain.   Respiratory: Positive for cough and shortness of breath.   Neurological: Positive for headaches.       Objective:   Physical Exam  Constitutional: She is oriented to person,  place, and time. She appears well-developed and well-nourished. No distress.  HENT:  Head: Normocephalic and atraumatic.  Right Ear: External ear normal.  Left Ear: External ear normal.  Mouth/Throat: Posterior oropharyngeal erythema present.  Pt has severe hoarseness.   Cardiovascular: Normal rate, regular rhythm and normal heart sounds. Exam reveals no gallop and no friction rub.  No murmur heard. Pulmonary/Chest: Effort normal and breath sounds normal. She has no wheezes.  Neurological: She is alert and oriented to person, place, and time.  Psychiatric: She has a normal mood and affect. Her behavior is normal.    Vitals:   10/12/17 0921  BP: 121/61  Pulse: 100  Temp: 98.4 F (36.9 C)  SpO2: 98%       Assessment & Plan:  Nancy Wang was seen today for cough, headache, sore throat and laryngitis.  Diagnoses and all orders for this visit:  Acute bronchitis, unspecified organism -     azithromycin (ZITHROMAX) 250 MG tablet; Take 2 tablets now and then one tablet for 4 days. -     methylPREDNISolone sodium succinate (SOLU-MEDROL) 125 mg/2 mL injection 125 mg  Laryngitis -     methylPREDNISolone sodium succinate (SOLU-MEDROL) 125 mg/2 mL injection 125 mg  Viral URI with cough -     HYDROcodone-homatropine (HYCODAN) 5-1.5 MG/5ML syrup; Take 5 mLs by mouth every 12 (twelve) hours as needed for cough. -     ipratropium (ATROVENT) 0.06 % nasal spray; Place 2 sprays into both nostrils 4 (four) times daily. -     methylPREDNISolone sodium succinate (SOLU-MEDROL) 125 mg/2 mL  injection 125 mg -     ipratropium-albuterol (DUONEB) 0.5-2.5 (3) MG/3ML nebulizer solution 3 mL   Discussed likely viral etiology. Symptomatic care encouraged. Duoneb given in office and she did get some relief with chest tightness. She has been given a prescription for an antibiotic (zithromax) to take in case she worsens in the next few days due to her  Medically immunosuppressed state. She was instructed that if  she gets worse by 10/14/17 then she can decide if she wants to start and complete her antibiotic regiment. She received a solu-medrol injection to help address inflammation of the larynx and her congestion. She has Hycodan to aid in cough suppression for sleep. Discussed sedation side effects.  She will use the Atrovent to help clear up her congestion. She should continue fluids, cough drops,  and rest.  She can message me on myChart if she has any questions or concerns.   [place] controlled substance database reviewed without concerns.

## 2017-10-14 ENCOUNTER — Ambulatory Visit (INDEPENDENT_AMBULATORY_CARE_PROVIDER_SITE_OTHER): Payer: Managed Care, Other (non HMO) | Admitting: Physician Assistant

## 2017-10-14 ENCOUNTER — Ambulatory Visit (INDEPENDENT_AMBULATORY_CARE_PROVIDER_SITE_OTHER): Payer: Managed Care, Other (non HMO)

## 2017-10-14 ENCOUNTER — Encounter: Payer: Self-pay | Admitting: Physician Assistant

## 2017-10-14 VITALS — BP 121/76 | HR 113 | Temp 98.7°F | Wt 177.0 lb

## 2017-10-14 DIAGNOSIS — R0602 Shortness of breath: Secondary | ICD-10-CM

## 2017-10-14 DIAGNOSIS — R000 Tachycardia, unspecified: Secondary | ICD-10-CM

## 2017-10-14 DIAGNOSIS — T451X5A Adverse effect of antineoplastic and immunosuppressive drugs, initial encounter: Secondary | ICD-10-CM

## 2017-10-14 DIAGNOSIS — J22 Unspecified acute lower respiratory infection: Secondary | ICD-10-CM

## 2017-10-14 DIAGNOSIS — R079 Chest pain, unspecified: Secondary | ICD-10-CM

## 2017-10-14 DIAGNOSIS — R05 Cough: Secondary | ICD-10-CM | POA: Diagnosis not present

## 2017-10-14 DIAGNOSIS — Z79899 Other long term (current) drug therapy: Secondary | ICD-10-CM | POA: Diagnosis not present

## 2017-10-14 DIAGNOSIS — D84821 Immunodeficiency due to drugs: Secondary | ICD-10-CM

## 2017-10-14 MED ORDER — HYDROCOD POLST-CPM POLST ER 10-8 MG/5ML PO SUER: 5.0000 mL | Freq: Two times a day (BID) | ORAL | 0 refills | Status: DC | PRN

## 2017-10-14 MED ORDER — GUAIFENESIN ER 600 MG PO TB12: 600.0000 mg | ORAL_TABLET | Freq: Two times a day (BID) | ORAL | 0 refills | Status: DC

## 2017-10-14 MED ORDER — ALBUTEROL SULFATE HFA 108 (90 BASE) MCG/ACT IN AERS: 1.0000 | INHALATION_SPRAY | Freq: Four times a day (QID) | RESPIRATORY_TRACT | 0 refills | Status: DC | PRN

## 2017-10-14 MED ORDER — BENZONATATE 200 MG PO CAPS: 200.0000 mg | ORAL_CAPSULE | Freq: Three times a day (TID) | ORAL | 0 refills | Status: DC | PRN

## 2017-10-14 MED ORDER — CEFDINIR 300 MG PO CAPS: 300.0000 mg | ORAL_CAPSULE | Freq: Two times a day (BID) | ORAL | 0 refills | Status: DC

## 2017-10-14 NOTE — Progress Notes (Signed)
HPI:                                                                Nancy Wang is a 47 y.o. female who presents to [facility] today for cough  Pleasant 47 yo F with PMH significant for RA on Methotrexate/Simponi presents today with worsening bronchospasm and cough. Cough has been present for 5 days, occasionally productive of clear sputum, persistent. She was seen by Iran Planas PA-C, diagnosed with acute bronchitis and treated with Azithromycin and Solumedrol. Patient presents today stating she feels worse than before. Denies fever, chills, hemoptysis, chest pain. Denies history of underlying pulmonary disease. States none of the medications are working, including the [employer].         Past Medical History:  Diagnosis Date  . Bronchitis   . GERD (gastroesophageal reflux disease)   . Pseudogout    Dr. Ouida Sills  . RA (rheumatoid arthritis) ([place])    Follow by Tobie Lords, MD - [facility]  . Thyroid disease    hypothyroidism   Past Surgical History:  Procedure Laterality Date  . ABDOMINAL SURGERY    . arthroscopic surgery     both knees, RT 1985 removed patella, LT 1988  . ESSURE TUBAL LIGATION    . exploratory lap  1985, 2004   for ovarian cysts, infertility   Social History   Tobacco Use  . Smoking status: Never Smoker  . Smokeless tobacco: Never Used  Substance Use Topics  . Alcohol use: Yes   family history includes Cancer in her father; Coronary artery disease in her father; Depression in her father; Diabetes in her mother and unknown relative; Hypertension in her mother and unknown relative.    ROS: negative except as noted in the HPI  Medications: Current Outpatient Medications  Medication Sig Dispense Refill  . buPROPion (WELLBUTRIN XL) 300 MG 24 hr tablet Take 1 tablet (300 mg total) by mouth daily. 90 tablet 0  . COLCRYS 0.6 MG tablet Take 0.6 mg by mouth daily.  2  .  DULoxetine (CYMBALTA) 30 MG capsule Take 1 capsule (30 mg total) by mouth daily. Then switch to the 60 mg after 2 weeks. 90 capsule 0  . folic acid (FOLVITE) 1 MG tablet     . Golimumab ([place] ARIA IV) Inject into the vein.    Marland Kitchen ipratropium (ATROVENT) 0.06 % nasal spray Place 2 sprays into both nostrils 4 (four) times daily. 15 mL 1  . levothyroxine (SYNTHROID, LEVOTHROID) 75 MCG tablet TAKE 1 TABLET BY MOUTH EVERY DAY BEFORE BREAKFAST 90 tablet 1  . methotrexate (RHEUMATREX) 2.5 MG tablet Take 2.5 mg by mouth once a week.     Marland Kitchen omeprazole (PRILOSEC) 20 MG capsule Take 1 capsule (20 mg total) by mouth daily. 90 capsule 3  . albuterol (PROVENTIL HFA;VENTOLIN HFA) 108 (90 Base) MCG/ACT inhaler Inhale 1-2 puffs into the lungs every 6 (six) hours as needed (bronchospasm). 1 Inhaler 0  . benzonatate (TESSALON) 200 MG capsule Take 1 capsule (200 mg total) by mouth 3 (three) times daily as needed for cough. 45 capsule 0  . cefdinir (OMNICEF) 300 MG capsule Take 1 capsule (300 mg total) by mouth 2 (two) times daily. 14 capsule 0  .  chlorpheniramine-HYDROcodone (TUSSIONEX) 10-8 MG/5ML SUER Take 5 mLs by mouth every 12 (twelve) hours as needed for cough (cough, will cause drowsiness.). 120 mL 0  . guaiFENesin (MUCINEX) 600 MG 12 hr tablet Take 1 tablet (600 mg total) by mouth 2 (two) times daily. 10 tablet 0   No current facility-administered medications for this visit.    Allergies  Allergen Reactions  . Citalopram Other (See Comments)    QT prolongation  . Morphine Sulfate        Objective:  BP 121/76   Pulse (!) 113   Temp 98.7 F (37.1 C) (Oral)   Wt 177 lb (80.3 kg)   LMP 09/30/2017   SpO2 96%   BMI 28.57 kg/m  Gen:  alert, ill-appearing, not toxic-appearing, no distress, appropriate for age 40: head normocephalic without obvious abnormality, conjunctiva and cornea clear, trachea midline Pulm: Normal work of breathing, normal phonation, clear to auscultation bilaterally, no  wheezes, rales or rhonchi; bronchospastic cough CV: tachycardic , regular rhythm, s1 and s2 distinct, no murmurs, clicks or rubs  Neuro: alert and oriented x 3, no tremor MSK: extremities atraumatic, normal gait and station Skin: intact, no rashes on exposed skin, no cyanosis  No results found for this or any previous visit (from the past 72 hour(s)). Dg Chest 2 View  Result Date: 10/14/2017 CLINICAL DATA:  Cough for 2 days.  Chest pain.  Short of breath. EXAM: CHEST  2 VIEW COMPARISON:  08/05/2014 FINDINGS: Normal heart size. Lungs clear. No pneumothorax. No pleural effusion. IMPRESSION: No active cardiopulmonary disease. Electronically Signed   By: Marybelle Killings M.D.   On: 10/14/2017 16:30      Assessment and Plan: 47 y.o. female with   1. Acute lower respiratory infection - SpO2 96% on RA at rest, no evidence of respiratory distress. Mild tachycardia. DG Chest 2 View to assess for infiltrate.  - Personally reviewed CXR, which was negative for acute pulmonary disease. This is still likely viral bronchitis, especially given non-response to Macrolide. However, given patient's immunosuppressed status, will trial alternate antibiotic. - continue symptomatic care - benzonatate (TESSALON) 200 MG capsule; Take 1 capsule (200 mg total) by mouth 3 (three) times daily as needed for cough.  Dispense: 45 capsule; Refill: 0 - chlorpheniramine-HYDROcodone (TUSSIONEX) 10-8 MG/5ML SUER; Take 5 mLs by mouth every 12 (twelve) hours as needed for cough (cough, will cause drowsiness.).  Dispense: 120 mL; Refill: 0 - cefdinir (OMNICEF) 300 MG capsule; Take 1 capsule (300 mg total) by mouth 2 (two) times daily.  Dispense: 14 capsule; Refill: 0 - guaiFENesin (MUCINEX) 600 MG 12 hr tablet; Take 1 tablet (600 mg total) by mouth 2 (two) times daily.  Dispense: 10 tablet; Refill: 0 - albuterol (PROVENTIL HFA;VENTOLIN HFA) 108 (90 Base) MCG/ACT inhaler; Inhale 1-2 puffs into the lungs every 6 (six) hours as needed  (bronchospasm).  Dispense: 1 Inhaler; Refill: 0  2. Tachycardia with heart rate 100-120 beats per minute  3. Immunosuppressed due to chemotherapy   Patient education and anticipatory guidance given Patient agrees with treatment plan Follow-up in 1 week with PCP for bronchitis or sooner as needed if symptoms worsen or fail to improve  Darlyne Russian PA-C

## 2017-10-14 NOTE — Patient Instructions (Addendum)
-   downstairs for chest x-ray today - antibiotic twice a day for 1 week - alternate tessalon and tussionex for cough - mucinex to make cough more productive - stop azithromycin and hycodan   Cough, Adult Coughing is a reflex that clears your throat and your airways. Coughing helps to heal and protect your lungs. It is normal to cough occasionally, but a cough that happens with other symptoms or lasts a long time may be a sign of a condition that needs treatment. A cough may last only 2-3 weeks (acute), or it may last longer than 8 weeks (chronic). What are the causes? Coughing is commonly caused by:  Breathing in substances that irritate your lungs.  A viral or bacterial respiratory infection.  Allergies.  Asthma.  Postnasal drip.  Smoking.  Acid backing up from the stomach into the esophagus (gastroesophageal reflux).  Certain medicines.  Chronic lung problems, including COPD (or rarely, lung cancer).  Other medical conditions such as heart failure.  Follow these instructions at home: Pay attention to any changes in your symptoms. Take these actions to help with your discomfort:  Take medicines only as told by your health care provider. ? If you were prescribed an antibiotic medicine, take it as told by your health care provider. Do not stop taking the antibiotic even if you start to feel better. ? Talk with your health care provider before you take a cough suppressant medicine.  Drink enough fluid to keep your urine clear or pale yellow.  If the air is dry, use a cold steam vaporizer or humidifier in your bedroom or your home to help loosen secretions.  Avoid anything that causes you to cough at work or at home.  If your cough is worse at night, try sleeping in a semi-upright position.  Avoid cigarette smoke. If you smoke, quit smoking. If you need help quitting, ask your health care provider.  Avoid caffeine.  Avoid alcohol.  Rest as needed.  Contact a health  care provider if:  You have new symptoms.  You cough up pus.  Your cough does not get better after 2-3 weeks, or your cough gets worse.  You cannot control your cough with suppressant medicines and you are losing sleep.  You develop pain that is getting worse or pain that is not controlled with pain medicines.  You have a fever.  You have unexplained weight loss.  You have night sweats. Get help right away if:  You cough up blood.  You have difficulty breathing.  Your heartbeat is very fast. This information is not intended to replace advice given to you by your health care provider. Make sure you discuss any questions you have with your health care provider. Document Released: 03/12/2011 Document Revised: 02/19/2016 Document Reviewed: 11/20/2014 Elsevier Interactive Patient Education  [employer].

## 2017-10-14 NOTE — Progress Notes (Signed)
Hi Jericca,  Your chest x-ray was normal. Treatment plan does not change.  I hope you feel better! Evlyn Clines

## 2017-10-17 ENCOUNTER — Encounter: Payer: Self-pay | Admitting: Physician Assistant

## 2017-10-17 DIAGNOSIS — Z79899 Other long term (current) drug therapy: Secondary | ICD-10-CM | POA: Insufficient documentation

## 2017-10-17 DIAGNOSIS — T451X5A Adverse effect of antineoplastic and immunosuppressive drugs, initial encounter: Secondary | ICD-10-CM | POA: Insufficient documentation

## 2017-10-19 ENCOUNTER — Ambulatory Visit (INDEPENDENT_AMBULATORY_CARE_PROVIDER_SITE_OTHER): Payer: Managed Care, Other (non HMO) | Admitting: Family Medicine

## 2017-10-19 ENCOUNTER — Encounter: Payer: Self-pay | Admitting: Family Medicine

## 2017-10-19 VITALS — BP 112/75 | HR 113 | Temp 99.2°F | Ht 66.0 in | Wt 177.0 lb

## 2017-10-19 DIAGNOSIS — R05 Cough: Secondary | ICD-10-CM

## 2017-10-19 DIAGNOSIS — R059 Cough, unspecified: Secondary | ICD-10-CM

## 2017-10-19 DIAGNOSIS — R509 Fever, unspecified: Secondary | ICD-10-CM | POA: Diagnosis not present

## 2017-10-19 DIAGNOSIS — J069 Acute upper respiratory infection, unspecified: Secondary | ICD-10-CM

## 2017-10-19 LAB — CBC WITH DIFFERENTIAL/PLATELET
Basophils Absolute: 90 {cells}/uL (ref 0–200)
Basophils Relative: 0.7 %
Eosinophils Absolute: 230 {cells}/uL (ref 15–500)
Eosinophils Relative: 1.8 %
HCT: 43.8 % (ref 35.0–45.0)
Hemoglobin: 14.9 g/dL (ref 11.7–15.5)
Lymphs Abs: 3328 {cells}/uL (ref 850–3900)
MCH: 32.1 pg (ref 27.0–33.0)
MCHC: 34 g/dL (ref 32.0–36.0)
MCV: 94.4 fL (ref 80.0–100.0)
MPV: 10.5 fL (ref 7.5–12.5)
Monocytes Relative: 10 %
Neutro Abs: 7872 {cells}/uL — ABNORMAL HIGH (ref 1500–7800)
Neutrophils Relative %: 61.5 %
Platelets: 342 Thousand/uL (ref 140–400)
RBC: 4.64 Million/uL (ref 3.80–5.10)
RDW: 12 % (ref 11.0–15.0)
Total Lymphocyte: 26 %
WBC mixed population: 1280 {cells}/uL — ABNORMAL HIGH (ref 200–950)
WBC: 12.8 Thousand/uL — ABNORMAL HIGH (ref 3.8–10.8)

## 2017-10-19 LAB — POCT INFLUENZA A/B
Influenza A, POC: NEGATIVE
Influenza B, POC: NEGATIVE

## 2017-10-19 NOTE — Progress Notes (Signed)
Subjective:    Patient ID: Nancy Wang, female    DOB: 03-26-1971, 47 y.o.   MRN: [id number]  HPI Patient comes in today to follow-up for bronchitis.  She was seen originally on January 16 after couple of days of persistent cough.  She was placed on prednisone and azithromycin.  After couple days she was actually getting worse so came in and was seen again.  Her level at that time was 96%.  She had a chest x-ray which was negative for any type of pneumonia etc.  It was felt to be likely bronchitis and since she did not respond to the macrolide she was actually placed on Omnicef and given Tessalon Perles and encouraged to take Mucinex.  She is also given an albuterol inhaler.  She is now following up 5 days later for recheck.  She still continues to cough. It is keeping her awake.  Hurting Monday night she actually started running a fever.  It has ranged from 99.4-100.1.  Is mostly been low-grade.  She actually feels like she is been getting worse the last couple of days.  She says the cough is better if she just sits and rest and says nothing but as soon as she starts moving or either talking it will trigger the cough which she feels is forceful.  She feels like it is coming from the upper airway and in her throat area.  He said she started getting a sore throat last night and now it is painful to swallow particularly on the left side.  Just some mild nasal congestion but not really a lot.   Review of Systems  BP 112/75   Pulse (!) 113   Temp 99.2 F (37.3 C)   Ht 5\' 6"  (1.676 m)   Wt 177 lb (80.3 kg)   LMP 09/30/2017   SpO2 100%   BMI 28.57 kg/m     Allergies  Allergen Reactions  . Citalopram Other (See Comments)    QT prolongation  . Morphine Sulfate     Past Medical History:  Diagnosis Date  . Bronchitis   . GERD (gastroesophageal reflux disease)   . Pseudogout    Dr. Ouida Sills  . RA (rheumatoid arthritis) ([place])    Follow by Tobie Lords, MD - [facility]  . Thyroid disease    hypothyroidism    Past Surgical History:  Procedure Laterality Date  . ABDOMINAL SURGERY    . arthroscopic surgery     both knees, RT 1985 removed patella, LT 1988  . ESSURE TUBAL LIGATION    . exploratory lap  1985, 2004   for ovarian cysts, infertility    Social History   Socioeconomic History  . Marital status: Married    Spouse name: Not on file  . Number of children: Not on file  . Years of education: Not on file  . Highest education level: Not on file  Social Needs  . Financial resource strain: Not on file  . Food insecurity - worry: Not on file  . Food insecurity - inability: Not on file  . Transportation needs - medical: Not on file  . Transportation needs - non-medical: Not on file  Occupational History  . Not on file  Tobacco Use  . Smoking status: Never Smoker  . Smokeless tobacco: Never Used  Substance and Sexual Activity  . Alcohol use: Yes  . Drug use: No  . Sexual activity: Not on file    Comment: married, no regular  exercise, limited by knee pain, has son.   Other Topics Concern  . Not on file  Social History Narrative  . Not on file    Family History  Problem Relation Age of Onset  . Cancer Father        prostate  . Coronary artery disease Father   . Depression Father   . Hypertension Mother   . Diabetes Mother   . Diabetes Unknown        family history  . Hypertension Unknown        family history    Outpatient Encounter Medications as of 10/19/2017  Medication Sig  . albuterol (PROVENTIL HFA;VENTOLIN HFA) 108 (90 Base) MCG/ACT inhaler Inhale 1-2 puffs into the lungs every 6 (six) hours as needed (bronchospasm).  Marland Kitchen buPROPion (WELLBUTRIN XL) 300 MG 24 hr tablet Take 1 tablet (300 mg total) by mouth daily.  . cefdinir (OMNICEF) 300 MG capsule Take 1 capsule (300 mg total) by mouth 2 (two) times daily.  . chlorpheniramine-HYDROcodone (TUSSIONEX) 10-8 MG/5ML SUER Take 5 mLs by mouth every 12 (twelve) hours  as needed for cough (cough, will cause drowsiness.).  Marland Kitchen COLCRYS 0.6 MG tablet Take 0.6 mg by mouth daily.  . DULoxetine (CYMBALTA) 30 MG capsule Take 1 capsule (30 mg total) by mouth daily. Then switch to the 60 mg after 2 weeks.  . folic acid (FOLVITE) 1 MG tablet   . Golimumab ([place] ARIA IV) Inject into the vein.  Marland Kitchen guaiFENesin (MUCINEX) 600 MG 12 hr tablet Take 1 tablet (600 mg total) by mouth 2 (two) times daily.  Marland Kitchen ipratropium (ATROVENT) 0.06 % nasal spray Place 2 sprays into both nostrils 4 (four) times daily.  Marland Kitchen levothyroxine (SYNTHROID, LEVOTHROID) 75 MCG tablet TAKE 1 TABLET BY MOUTH EVERY DAY BEFORE BREAKFAST  . methotrexate (RHEUMATREX) 2.5 MG tablet Take 2.5 mg by mouth once a week.   Marland Kitchen omeprazole (PRILOSEC) 20 MG capsule Take 1 capsule (20 mg total) by mouth daily.  . [DISCONTINUED] benzonatate (TESSALON) 200 MG capsule Take 1 capsule (200 mg total) by mouth 3 (three) times daily as needed for cough.   No facility-administered encounter medications on file as of 10/19/2017.          Objective:   Physical Exam  Constitutional: She is oriented to person, place, and time. She appears well-developed and well-nourished.  HENT:  Head: Normocephalic and atraumatic.  Right Ear: External ear normal.  Left Ear: External ear normal.  Nose: Nose normal.  TMs and canals are clear. Mild erythema on the left OP with some petechia of the uvula.   Eyes: Conjunctivae and EOM are normal. Pupils are equal, round, and reactive to light.  Neck: Neck supple. No thyromegaly present.  Cardiovascular: Normal rate, regular rhythm and normal heart sounds.  Pulmonary/Chest: Effort normal and breath sounds normal. She has no wheezes.  Lymphadenopathy:    She has no cervical adenopathy.  Neurological: She is alert and oriented to person, place, and time.  Skin: Skin is warm and dry.  Psychiatric: She has a normal mood and affect.       Assessment & Plan:  Upper respiratory infection-I really  think this is viral.  At this point she is been on azithromycin and Omnicef without any significant improvement in symptoms and she is now running a low-grade temperature over the last couple of days.  She has had a negative chest x-ray and her lungs are completely clear on exam.  Her pulse ox is 99%  which is very reassuring.   Really feel very strongly this is viral.  Check CBC we did go ahead and swab for pertussis as well.  Flu swab was negative.  With results once available.  Because she has rheumatoid arthritis and she is on methotrexate as well as Simponi I did recommend that she hold off on taking these treatments until she is feeling better.  I did encourage her to increase her omeprazole to twice a day for more maximal control.  Explained that sometimes forceful repetitive cough can actually worsen reflux and then the reflux actually triggers the cough and it can be a cycle.  I did refill the cough syrup.

## 2017-10-21 LAB — BORDETELLA PERTUSSIS PCR
B. PERTUSSIS DNA: NOT DETECTED
B. parapertussis DNA: NOT DETECTED

## 2017-10-28 LAB — CULTURE, BORDETELLA PERTUSSIS

## 2017-11-06 ENCOUNTER — Other Ambulatory Visit: Payer: Self-pay | Admitting: Physician Assistant

## 2017-11-06 DIAGNOSIS — J22 Unspecified acute lower respiratory infection: Secondary | ICD-10-CM

## 2017-11-07 ENCOUNTER — Ambulatory Visit: Payer: Managed Care, Other (non HMO) | Admitting: Family Medicine

## 2017-11-07 DIAGNOSIS — Z0189 Encounter for other specified special examinations: Secondary | ICD-10-CM

## 2017-11-22 ENCOUNTER — Telehealth: Payer: Self-pay

## 2017-11-22 MED ORDER — OSELTAMIVIR PHOSPHATE 75 MG PO CAPS: 75.0000 mg | ORAL_CAPSULE | Freq: Every day | ORAL | 0 refills | Status: DC

## 2017-11-22 NOTE — Telephone Encounter (Signed)
Patient advised.

## 2017-11-22 NOTE — Telephone Encounter (Signed)
Sayda called and states son tested positive for Flu A. She doesn't have symptoms. She would like tamiflu.   CMP Latest Ref Rng & Units 06/21/2017 12/24/2013 10/13/2010  Glucose 65 - 99 mg/dL 110(H) 83 89  BUN 7 - 25 mg/dL 14 10 10   Creatinine 0.50 - 1.10 mg/dL 0.91 0.73 0.76  Sodium 135 - 146 mmol/L 139 135 141  Potassium 3.5 - 5.3 mmol/L 3.9 4.3 3.8  Chloride 98 - 110 mmol/L 103 101 106  CO2 20 - 32 mmol/L 28 28 24   Calcium 8.6 - 10.2 mg/dL 8.9 9.3 9.1  Total Protein 6.1 - 8.1 g/dL 6.6 6.6 7.0  Total Bilirubin 0.2 - 1.2 mg/dL 0.8 1.0 0.6  Alkaline Phos 39 - 117 U/L - 74 106  AST 10 - 35 U/L 11 17 15   ALT 6 - 29 U/L 14 28 16

## 2017-11-22 NOTE — Telephone Encounter (Signed)
rx sent

## 2017-12-06 ENCOUNTER — Other Ambulatory Visit: Payer: Self-pay | Admitting: Family Medicine

## 2017-12-06 DIAGNOSIS — J22 Unspecified acute lower respiratory infection: Secondary | ICD-10-CM

## 2018-01-07 ENCOUNTER — Other Ambulatory Visit: Payer: Self-pay | Admitting: Family Medicine

## 2018-01-16 ENCOUNTER — Other Ambulatory Visit: Payer: Self-pay | Admitting: Family Medicine

## 2018-02-27 ENCOUNTER — Emergency Department (INDEPENDENT_AMBULATORY_CARE_PROVIDER_SITE_OTHER)
Admission: EM | Admit: 2018-02-27 | Discharge: 2018-02-27 | Disposition: A | Discharge status: Home / Self Care | Payer: Managed Care, Other (non HMO) | Source: Home / Self Care | Attending: Family Medicine | Admitting: Family Medicine

## 2018-02-27 ENCOUNTER — Other Ambulatory Visit: Payer: Self-pay

## 2018-02-27 DIAGNOSIS — J029 Acute pharyngitis, unspecified: Secondary | ICD-10-CM

## 2018-02-27 DIAGNOSIS — J069 Acute upper respiratory infection, unspecified: Secondary | ICD-10-CM

## 2018-02-27 DIAGNOSIS — B9789 Other viral agents as the cause of diseases classified elsewhere: Secondary | ICD-10-CM

## 2018-02-27 LAB — POCT RAPID STREP A (OFFICE): RAPID STREP A SCREEN: NEGATIVE

## 2018-02-27 MED ORDER — BENZONATATE 200 MG PO CAPS: ORAL_CAPSULE | ORAL | 0 refills | Status: DC

## 2018-02-27 MED ORDER — DOXYCYCLINE HYCLATE 100 MG PO CAPS: 100.0000 mg | ORAL_CAPSULE | Freq: Two times a day (BID) | ORAL | 0 refills | Status: DC

## 2018-02-27 MED ORDER — PREDNISONE 20 MG PO TABS: ORAL_TABLET | ORAL | 0 refills | Status: DC

## 2018-02-27 NOTE — ED Triage Notes (Signed)
Pt c/o sore throat since last night. Deep chest cough. 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

## 2018-02-27 NOTE — Discharge Instructions (Addendum)
Take plain guaifenesin (1200mg  extended release tabs such as Mucinex) twice daily, with plenty of water, for cough and congestion.  May add Pseudoephedrine (30mg , one or two every 4 to 6 hours) for sinus congestion.  Get adequate rest.   May use Afrin nasal spray (or generic oxymetazoline) each morning for about 5 days and then discontinue.  Also recommend using saline nasal spray several times daily and saline nasal irrigation (AYR is a common brand).  Use Flonase nasal spray each morning after using Afrin nasal spray and saline nasal irrigation. Try warm salt water gargles for sore throat.  Stop all antihistamines for now, and other non-prescription cough/cold preparations. May take Delsym Cough Suppressant at bedtime for nighttime cough.  Continue albuterol inhaler as needed.

## 2018-02-27 NOTE — ED Provider Notes (Signed)
[name]    CSN: [id number] Arrival date & time: 02/27/18  1547     History   Chief Complaint Chief Complaint  Patient presents with  . Sore Throat  . Cough    HPI Nancy Wang is a 47 y.o. female.   Patient developed a sore throat and fatigue last night, and today awoke with a non-productive cough and nasal congestion.  She denies fever.  She had asthma as a child and still uses an albuterol inhaler occasionally.  She has had multiple episodes of pneumonia in the past.  The history is provided by the patient.    Past Medical History:  Diagnosis Date  . Bronchitis   . GERD (gastroesophageal reflux disease)   . Pseudogout    Dr. Ouida Sills  . RA (rheumatoid arthritis) ([place])    Follow by Tobie Lords, MD - [facility]  . Thyroid disease    hypothyroidism    Patient Active Problem List   Diagnosis Date Noted  . 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 Hyperlipidemia 03/05/2015  . Insomnia 06/07/2014  . QT prolongation 08/02/2013  . UNSPECIFIED VITAMIN D DEFICIENCY 12/10/2010  . ALLERGIC RHINITIS 10/08/2010  . HYPOXEMIA 09/02/2010  . Depression 06/26/2010  . PSEUDOGOUT 05/26/2009  . RHEUMATOID ARTHRITIS 05/26/2009  . IRON, SERUM, ELEVATED 05/26/2009  . ELEVATED BLOOD PRESSURE 05/26/2009  . Hypothyroidism 03/31/2009  . KNEE PAIN, LEFT 03/31/2009    Past Surgical History:  Procedure Laterality Date  . ABDOMINAL SURGERY    . arthroscopic surgery     both knees, RT 1985 removed patella, LT 1988  . ESSURE TUBAL LIGATION    . exploratory lap  1985, 2004   for ovarian cysts, infertility    OB History   None      Home Medications    Prior to Admission medications   Medication Sig Start Date End Date Taking? Authorizing Provider  albuterol (PROVENTIL HFA;VENTOLIN HFA) 108 (90 Base) MCG/ACT inhaler Inhale 1-2 puffs into the lungs every 6 hours as needed for bronchospasm  12/06/17   Hali Marry, MD    benzonatate (TESSALON) 200 MG capsule Take one cap by mouth at bedtime as needed for cough.  May repeat in 4 to 6 hours 02/27/18   Kandra Nicolas, MD  buPROPion (WELLBUTRIN XL) 300 MG 24 hr tablet Take 1 tablet (300 mg total) by mouth daily. 06/13/17   Hali Marry, MD  cefdinir (OMNICEF) 300 MG capsule Take 1 capsule (300 mg total) by mouth 2 (two) times daily. 10/14/17   Trixie Dredge, PA-C  chlorpheniramine-HYDROcodone ([place]) 10-8 MG/5ML SUER Take 5 mLs by mouth every 12 (twelve) hours as needed for cough (cough, will cause drowsiness.). 10/14/17   Trixie Dredge, PA-C  COLCRYS 0.6 MG tablet Take 0.6 mg by mouth daily. 08/22/17   [provider]  doxycycline (VIBRAMYCIN) 100 MG capsule Take 1 capsule (100 mg total) by mouth 2 (two) times daily. Take with food. 02/27/18   Kandra Nicolas, MD  DULoxetine (CYMBALTA) 30 MG capsule Take 2 capsules (60 mg total) by mouth daily. 01/16/18   Hali Marry, MD  folic acid (FOLVITE) 1 MG tablet  12/19/15   [provider]  Golimumab ([place] ARIA IV) Inject into the vein.    [provider]  guaiFENesin (MUCINEX) 600 MG 12 hr tablet Take 1 tablet (600 mg total) by mouth 2 (two) times daily. 10/14/17   Trixie Dredge, PA-C  ipratropium (ATROVENT) 0.06 %  nasal spray Place 2 sprays into both nostrils 4 (four) times daily. 10/12/17   Breeback, Jade L, PA-C  levothyroxine (SYNTHROID, LEVOTHROID) 75 MCG tablet TAKE 1 TABLET BY MOUTH EVERY DAY BEFORE BREAKFAST 01/09/18   Hali Marry, MD  methotrexate (RHEUMATREX) 2.5 MG tablet Take 2.5 mg by mouth once a week.  06/06/14   [provider]  omeprazole (PRILOSEC) 20 MG capsule Take 1 capsule (20 mg total) by mouth daily. 08/30/17   Hali Marry, MD  oseltamivir (TAMIFLU) 75 MG capsule Take 1 capsule (75 mg total) by mouth daily. 11/22/17   Hali Marry, MD  predniSONE (DELTASONE) 20 MG tablet Take one tab  by mouth twice daily for 4 days, then one daily. Take with food. 02/27/18   Kandra Nicolas, MD    Family History Family History  Problem Relation Age of Onset  . Cancer Father        prostate  . Coronary artery disease Father   . Depression Father   . Hypertension Mother   . Diabetes Mother   . Diabetes Unknown        family history  . Hypertension Unknown        family history    Social History Social History   Tobacco Use  . Smoking status: Never Smoker  . Smokeless tobacco: Never Used  Substance Use Topics  . Alcohol use: Yes  . Drug use: No     Allergies   Citalopram and Morphine sulfate   Review of Systems Review of Systems + sore throat + cough No pleuritic pain No wheezing + nasal congestion + post-nasal drainage No sinus pain/pressure No itchy/red eyes No earache No hemoptysis No SOB No fever, + chills + nausea No vomiting No abdominal pain No diarrhea No urinary symptoms No skin rash + fatigue No myalgias No headache    Physical Exam Triage Vital Signs ED Triage Vitals  Enc Vitals Group     BP 02/27/18 1805 (!) 146/96     Pulse Rate 02/27/18 1805 (!) 107     Resp 02/27/18 1805 18     Temp 02/27/18 1805 98.7 F (37.1 C)     Temp Source 02/27/18 1805 Oral     SpO2 02/27/18 1805 100 %     Weight 02/27/18 1806 179 lb (81.2 kg)     Height 02/27/18 1806 5\' 6"  (1.676 m)     Head Circumference --      Peak Flow --      Pain Score 02/27/18 1806 0     Pain Loc --      Pain Edu? --      Excl. in [place]? --    No data found.  Updated Vital Signs BP (!) 146/96 (BP Location: Right Arm)   Pulse (!) 107   Temp 98.7 F (37.1 C) (Oral)   Resp 18   Ht 5\' 6"  (1.676 m)   Wt 179 lb (81.2 kg)   SpO2 100%   BMI 28.89 kg/m   Visual Acuity Right Eye Distance:   Left Eye Distance:   Bilateral Distance:    Right Eye Near:   Left Eye Near:    Bilateral Near:     Physical Exam Nursing notes and Vital Signs reviewed. Appearance:  Patient  appears stated age, and in no acute distress Eyes:  Pupils are equal, round, and reactive to light and accomodation.  Extraocular movement is intact.  Conjunctivae are not inflamed  Ears:  Canals  normal.  Tympanic membranes normal.  Nose:  Mildly congested turbinates.  No sinus tenderness.  Pharynx:  Minimal erythema Neck:  Supple.  Enlarged posterior/lateral nodes are palpated bilaterally, tender to palpation on the left.   Lungs:  Clear to auscultation.  Breath sounds are equal.  Moving air well. Heart:  Regular rate and rhythm without murmurs, rubs, or gallops.  Abdomen:  Nontender without masses or hepatosplenomegaly.  Bowel sounds are present.  No CVA or flank tenderness.  Extremities:  No edema.  Skin:  No rash present.    UC Treatments / Results  Labs (all labs ordered are listed, but only abnormal results are displayed) Labs Reviewed  STREP A DNA PROBE  POCT RAPID STREP A (OFFICE) negative    EKG None  Radiology No results found.  Procedures Procedures (including critical care time)  Medications Ordered in UC Medications - No data to display  Initial Impression / Assessment and Plan / UC Course  I have reviewed the triage vital signs and the nursing notes.  Pertinent labs & imaging results that were available during my care of the patient were reviewed by me and considered in my medical decision making (see chart for details).    Because of her history of reactive airways disease and multiple past episodes of pneumonia, will begin empiric doxycycline and prednisone burst/taper. Prescription written for Benzonatate [facility]) to take at bedtime for night-time cough.  Followup with Family Doctor if not improved in one week.    Final Clinical Impressions(s) / UC Diagnoses   Final diagnoses:  Pharyngitis, unspecified etiology  Viral URI with cough     Discharge Instructions     Take plain guaifenesin (1200mg  extended release tabs such as Mucinex) twice daily,  with plenty of water, for cough and congestion.  May add Pseudoephedrine (30mg , one or two every 4 to 6 hours) for sinus congestion.  Get adequate rest.   May use Afrin nasal spray (or generic oxymetazoline) each morning for about 5 days and then discontinue.  Also recommend using saline nasal spray several times daily and saline nasal irrigation (AYR is a common brand).  Use Flonase nasal spray each morning after using Afrin nasal spray and saline nasal irrigation. Try warm salt water gargles for sore throat.  Stop all antihistamines for now, and other non-prescription cough/cold preparations. May take Delsym Cough Suppressant at bedtime for nighttime cough.  Continue albuterol inhaler as needed.      ED Prescriptions    Medication Sig Dispense Auth. Provider   doxycycline (VIBRAMYCIN) 100 MG capsule Take 1 capsule (100 mg total) by mouth 2 (two) times daily. Take with food. 20 capsule Kandra Nicolas, MD   benzonatate (TESSALON) 200 MG capsule Take one cap by mouth at bedtime as needed for cough.  May repeat in 4 to 6 hours 15 capsule Kandra Nicolas, MD   predniSONE (DELTASONE) 20 MG tablet Take one tab by mouth twice daily for 4 days, then one daily. Take with food. 12 tablet Kandra Nicolas, MD         Kandra Nicolas, MD 02/27/18 [phone]

## 2018-02-28 ENCOUNTER — Telehealth: Payer: Self-pay | Admitting: *Deleted

## 2018-02-28 LAB — STREP A DNA PROBE: GROUP A STREP PROBE: NOT DETECTED

## 2018-02-28 NOTE — Telephone Encounter (Signed)
LM with Tcx results and to call back if she has any questions or concerns.  

## 2018-03-08 ENCOUNTER — Encounter: Payer: Self-pay | Admitting: Physician Assistant

## 2018-03-08 ENCOUNTER — Telehealth: Payer: Self-pay

## 2018-03-08 ENCOUNTER — Ambulatory Visit (INDEPENDENT_AMBULATORY_CARE_PROVIDER_SITE_OTHER): Payer: Managed Care, Other (non HMO) | Admitting: Physician Assistant

## 2018-03-08 VITALS — BP 119/80 | HR 93 | Temp 98.1°F | Wt 174.0 lb

## 2018-03-08 DIAGNOSIS — B309 Viral conjunctivitis, unspecified: Secondary | ICD-10-CM

## 2018-03-08 MED ORDER — POLYMYXIN B-TRIMETHOPRIM 10000-0.1 UNIT/ML-% OP SOLN: 1.0000 [drp] | OPHTHALMIC | 0 refills | Status: AC

## 2018-03-08 MED ORDER — NEOMYCIN-POLYMYXIN-HC OP SUSP: 1.0000 [drp] | OPHTHALMIC | 0 refills | Status: AC

## 2018-03-08 MED ORDER — KETOTIFEN FUMARATE 0.025 % OP SOLN: 1.0000 [drp] | Freq: Two times a day (BID) | OPHTHALMIC | 0 refills | Status: AC

## 2018-03-08 NOTE — Patient Instructions (Signed)
Viral Conjunctivitis, Adult Viral conjunctivitis is an inflammation of the clear membrane that covers the white part of your eye and the inner surface of your eyelid (conjunctiva). The inflammation is caused by a viral infection. The blood vessels in the conjunctiva become inflamed, causing the eye to become red or pink, and often itchy. Viral conjunctivitis can be easily passed from one person to another (is contagious). This condition is often called pink eye. What are the causes? This condition is caused by a virus. A virus is a type of contagious germ. It can be spread by touching objects that have been contaminated with the virus, such as doorknobs or towels. It can also be passed through droplets, such as from coughing or sneezing. What are the signs or symptoms? Symptoms of this condition include:  Eye redness.  Tearing or watery eyes.  Itchy and irritated eyes.  Burning feeling in the eyes.  Clear drainage from the eye.  Swollen eyelids.  A gritty feeling in the eye.  Light sensitivity.  This condition often occurs with other symptoms, such as a fever, nausea, or a rash. How is this diagnosed? This condition is diagnosed with a medical history and physical exam. If you have discharge from your eye, the discharge may be tested to rule out other causes of conjunctivitis. How is this treated? Viral conjunctivitis does not respond to medicines that kill bacteria (antibiotics). Treatment for viral conjunctivitis is directed at stopping a bacterial infection from developing in addition to the viral infection. Treatment also aims to relieve your symptoms, such as itching. This may be done with antihistamine drops or other eye medicines. Rarely, steroid eye drops or antiviral medicines may be prescribed. Follow these instructions at home: Medicines   Take or apply over-the-counter and prescription medicines only as told by your health care provider.  Be very careful to avoid  touching the edge of the eyelid with the eye drop bottle or ointment tube when applying medicines to the affected eye. Being careful this way will stop you from spreading the infection to the other eye or to other people. Eye care  Avoid touching or rubbing your eyes.  Apply a warm, wet, clean washcloth to your eye for 10-20 minutes, 3-4 times per day or as told by your health care provider.  If you wear contact lenses, do not wear them until the inflammation is gone and your health care provider says it is safe to wear them again. Ask your health care provider how to sterilize or replace your contact lenses before using them again. Wear glasses until you can resume wearing contacts.  Avoid wearing eye makeup until the inflammation is gone. Throw away any old eye cosmetics that may be contaminated.  Gently wipe away any drainage from your eye with a warm, wet washcloth or a cotton ball. General instructions  Change or wash your pillowcase every day or as told by your health care provider.  Do not share towels, pillowcases, washcloths, eye makeup, makeup brushes, contact lenses, or glasses. This may spread the infection.  Wash your hands often with soap and water. Use paper towels to dry your hands. If soap and water are not available, use hand sanitizer.  Try to avoid contact with other people for one week or as told by your health care provider. Contact a health care provider if:  Your symptoms do not improve with treatment or they get worse.  You have increased pain.  Your vision becomes blurry.  You have a   fever.  You have facial pain, redness, or swelling.  You have yellow or green drainage coming from your eye.  You have new symptoms. This information is not intended to replace advice given to you by your health care provider. Make sure you discuss any questions you have with your health care provider. Document Released: 12/04/2002 Document Revised: 04/10/2016 Document  Reviewed: 03/30/2016 Elsevier Interactive Patient Education  2018 [employer]  

## 2018-03-08 NOTE — Progress Notes (Signed)
HPI:                                                                Nancy Wang is a 47 y.o. female who presents to [facility]: [place] today for right eye redness     Conjunctivitis   The current episode started yesterday. The onset was sudden. The problem occurs continuously. The problem has been unchanged. The problem is mild. Nothing relieves the symptoms. Nothing aggravates the symptoms. Associated symptoms include diarrhea (Monday), nausea, vomiting, sore throat and eye redness. Pertinent negatives include no eye itching, no eye discharge (watery) and no eye pain. The right eye is affected.       Past Medical History:  Diagnosis Date  . Bronchitis   . GERD (gastroesophageal reflux disease)   . Pseudogout    Dr. Ouida Sills  . RA (rheumatoid arthritis) ([place])    Follow by Tobie Lords, MD - [facility]  . Thyroid disease    hypothyroidism   Past Surgical History:  Procedure Laterality Date  . ABDOMINAL SURGERY    . arthroscopic surgery     both knees, RT 1985 removed patella, LT 1988  . ESSURE TUBAL LIGATION    . exploratory lap  1985, 2004   for ovarian cysts, infertility   Social History   Tobacco Use  . Smoking status: Never Smoker  . Smokeless tobacco: Never Used  Substance Use Topics  . Alcohol use: Yes   family history includes Cancer in her father; Coronary artery disease in her father; Depression in her father; Diabetes in her mother and unknown relative; Hypertension in her mother and unknown relative.    ROS: negative except as noted in the HPI  Medications: Current Outpatient Medications  Medication Sig Dispense Refill  . albuterol (PROVENTIL HFA;VENTOLIN HFA) 108 (90 Base) MCG/ACT inhaler Inhale 1-2 puffs into the lungs every 6 hours as needed for bronchospasm  18 Inhaler 3  . buPROPion (WELLBUTRIN XL) 300 MG 24 hr tablet Take 1 tablet (300 mg total) by mouth daily. 90 tablet 0  .  chlorpheniramine-HYDROcodone (TUSSIONEX) 10-8 MG/5ML SUER Take 5 mLs by mouth every 12 (twelve) hours as needed for cough (cough, will cause drowsiness.). 120 mL 0  . COLCRYS 0.6 MG tablet Take 0.6 mg by mouth daily.  2  . doxycycline (VIBRAMYCIN) 100 MG capsule Take 1 capsule (100 mg total) by mouth 2 (two) times daily. Take with food. 20 capsule 0  . DULoxetine (CYMBALTA) 30 MG capsule Take 2 capsules (60 mg total) by mouth daily. 326 capsule 1  . folic acid (FOLVITE) 1 MG tablet     . Golimumab ([place] ARIA IV) Inject into the vein.    Marland Kitchen ipratropium (ATROVENT) 0.06 % nasal spray Place 2 sprays into both nostrils 4 (four) times daily. 15 mL 1  . levothyroxine (SYNTHROID, LEVOTHROID) 75 MCG tablet TAKE 1 TABLET BY MOUTH EVERY DAY BEFORE BREAKFAST 90 tablet 1  . methotrexate (RHEUMATREX) 2.5 MG tablet Take 2.5 mg by mouth once a week.     Marland Kitchen omeprazole (PRILOSEC) 20 MG capsule Take 1 capsule (20 mg total) by mouth daily. 90 capsule 3   No current facility-administered medications for this visit.    Allergies  Allergen Reactions  .  Citalopram Other (See Comments)    QT prolongation  . Morphine Sulfate        Objective:  BP 119/80   Pulse 93   Temp 98.1 F (36.7 C) (Oral)   Wt 174 lb (78.9 kg)   BMI 28.08 kg/m  Gen:  alert, not ill-appearing, no distress, appropriate for age HEENT: head normocephalic without obvious abnormality, wearing glasses, eyelid without edema or debris, right sclera injected, no purulent drainage, PERRL, EOM's intact, oropharynx with erythema, no exudates, uvula midline,  trachea midline Pulm: Normal work of breathing, normal phonation Neuro: alert and oriented x 3, no tremor MSK: extremities atraumatic, normal gait and station Skin: intact, no rashes on exposed skin, no jaundice, no cyanosis Psych: well-groomed, cooperative, good eye contact, euthymic mood, affect mood-congruent, speech is articulate, and thought processes clear and goal-directed    No  results found for this or any previous visit (from the past 72 hour(s)). No results found.    Assessment and Plan: 47 y.o. female with   Acute viral conjunctivitis of right eye - Plan: trimethoprim-polymyxin b (POLYTRIM) ophthalmic solution, ketotifen (ZADITOR) 0.025 % ophthalmic solution - 2 days of eye redness and epiphora. No FB sensation, eye pain or visual change - mostly likely viral etiology given symptoms were preceded by nausea, vomiting, diarrhea and sore throat - Zaditor drops refrigerated bid - counseled on hand washing and highly contagious nature   Patient education and anticipatory guidance given Patient agrees with treatment plan Follow-up as needed if symptoms worsen or fail to improve  Darlyne Russian PA-C

## 2018-03-08 NOTE — Telephone Encounter (Signed)
[facility] called and states Polytrim is on back order. They do recommend cortisporin. Please advise.

## 2018-04-19 ENCOUNTER — Other Ambulatory Visit: Payer: Self-pay | Admitting: Family Medicine

## 2018-04-23 ENCOUNTER — Other Ambulatory Visit: Payer: Self-pay | Admitting: Family Medicine

## 2018-05-16 ENCOUNTER — Ambulatory Visit (INDEPENDENT_AMBULATORY_CARE_PROVIDER_SITE_OTHER): Payer: Managed Care, Other (non HMO) | Admitting: Family Medicine

## 2018-05-16 ENCOUNTER — Encounter: Payer: Self-pay | Admitting: Family Medicine

## 2018-05-16 VITALS — BP 134/80 | HR 110 | Ht 66.0 in | Wt 186.0 lb

## 2018-05-16 DIAGNOSIS — R251 Tremor, unspecified: Secondary | ICD-10-CM | POA: Diagnosis not present

## 2018-05-16 DIAGNOSIS — Z1231 Encounter for screening mammogram for malignant neoplasm of breast: Secondary | ICD-10-CM

## 2018-05-16 DIAGNOSIS — N912 Amenorrhea, unspecified: Secondary | ICD-10-CM

## 2018-05-16 DIAGNOSIS — Z23 Encounter for immunization: Secondary | ICD-10-CM | POA: Diagnosis not present

## 2018-05-16 NOTE — Progress Notes (Signed)
Subjective:    Patient ID: Nancy Wang, female    DOB: [birth year], 47 y.o.   MRN: [id number]  HPI 47 year old female comes in today complaining of a tremor that has been going on for couple months.  She says it does seem to come and go in intensity.  She notices it more in the right hand but she is right-handed such as does a lot more work and lifting with that hand.  She will notice it if she is picking up a fork.  It seems to be better when she is resting her hand.  Occasionally she will actually get a little bit of tremor in her lower jaw but that also seems to come and go and is not nearly as prevalent.  She is noticed some shakiness in her handwriting but it has not been small handwriting.  No family history of any type of tremor.  She does have rheumatoid arthritis.  No other neurologic symptoms such as paresthesias or tingling.  No weakness in the extremities.  She also complains that her last period was about 7 weeks ago.  She wonders if she could be becoming perimenopausal.  Has had this happen before where she will skip an occasional.  Though she does have some spotting today.     Review of Systems  BP 134/80   Pulse (!) 110   Ht 5\' 6"  (1.676 m)   Wt 186 lb (84.4 kg)   BMI 30.02 kg/m     Allergies  Allergen Reactions  . Citalopram Other (See Comments)    QT prolongation  . Morphine Sulfate     Past Medical History:  Diagnosis Date  . Bronchitis   . GERD (gastroesophageal reflux disease)   . Pseudogout    Dr. Ouida Sills  . RA (rheumatoid arthritis) ([place])    Follow by Tobie Lords, MD - [facility]  . Thyroid disease    hypothyroidism    Past Surgical History:  Procedure Laterality Date  . ABDOMINAL SURGERY    . arthroscopic surgery     both knees, RT 1985 removed patella, LT 1988  . ESSURE TUBAL LIGATION    . exploratory lap  1985, 2004   for ovarian cysts, infertility    Social History   Socioeconomic History  . Marital status:  Married    Spouse name: Not on file  . Number of children: Not on file  . Years of education: Not on file  . Highest education level: Not on file  Occupational History  . Not on file  Social Needs  . Financial resource strain: Not on file  . Food insecurity:    Worry: Not on file    Inability: Not on file  . Transportation needs:    Medical: Not on file    Non-medical: Not on file  Tobacco Use  . Smoking status: Never Smoker  . Smokeless tobacco: Never Used  Substance and Sexual Activity  . Alcohol use: Yes  . Drug use: No  . Sexual activity: Not on file    Comment: married, no regular exercise, limited by knee pain, has son.   Lifestyle  . Physical activity:    Days per week: Not on file    Minutes per session: Not on file  . Stress: Not on file  Relationships  . Social connections:    Talks on phone: Not on file    Gets together: Not on file    Attends religious service: Not on file  Active member of club or organization: Not on file    Attends meetings of clubs or organizations: Not on file    Relationship status: Not on file  . Intimate partner violence:    Fear of current or ex partner: Not on file    Emotionally abused: Not on file    Physically abused: Not on file    Forced sexual activity: Not on file  Other Topics Concern  . Not on file  Social History Narrative  . Not on file    Family History  Problem Relation Age of Onset  . Cancer Father        prostate  . Coronary artery disease Father   . Depression Father   . Hypertension Mother   . Diabetes Mother   . Diabetes Unknown        family history  . Hypertension Unknown        family history    Outpatient Encounter Medications as of 05/16/2018  Medication Sig  . albuterol (PROVENTIL HFA;VENTOLIN HFA) 108 (90 Base) MCG/ACT inhaler Inhale 1-2 puffs into the lungs every 6 hours as needed for bronchospasm   . buPROPion (WELLBUTRIN XL) 300 MG 24 hr tablet TAKE 1 TABLET BY MOUTH EVERY DAY  .  chlorpheniramine-HYDROcodone (TUSSIONEX) 10-8 MG/5ML SUER Take 5 mLs by mouth every 12 (twelve) hours as needed for cough (cough, will cause drowsiness.).  Marland Kitchen COLCRYS 0.6 MG tablet Take 0.6 mg by mouth daily.  . DULoxetine (CYMBALTA) 30 MG capsule Take 2 capsules (60 mg total) by mouth daily.  . folic acid (FOLVITE) 1 MG tablet   . Golimumab ([place] ARIA IV) Inject into the vein.  Marland Kitchen ipratropium (ATROVENT) 0.06 % nasal spray Place 2 sprays into both nostrils 4 (four) times daily.  Marland Kitchen levothyroxine (SYNTHROID, LEVOTHROID) 75 MCG tablet TAKE 1 TABLET BY MOUTH EVERY DAY BEFORE BREAKFAST  . methotrexate (RHEUMATREX) 2.5 MG tablet Take 2.5 mg by mouth once a week.   Marland Kitchen omeprazole (PRILOSEC) 20 MG capsule Take 1 capsule (20 mg total) by mouth daily.   No facility-administered encounter medications on file as of 05/16/2018.          Objective:   Physical Exam  Constitutional: She is oriented to person, place, and time. She appears well-developed and well-nourished.  HENT:  Head: Normocephalic and atraumatic.  Eyes: Conjunctivae and EOM are normal.  Cardiovascular: Normal rate.  Pulmonary/Chest: Effort normal.  Musculoskeletal:  She does have tremor in her right hand and just mildly so in the left when she holds her hands out.  But at rest in her lap there is actually no tremor.  There is a little bit of intention tremor when she reaches to touch my finger on the right and there again just slight on the left.  Shoulder, elbow and wrist strength is 5 out of 5 bilaterally.  I do not appreciate any cogwheeling or rigidity in the arms.  There is no tremor present in the jaw or the face today.  Neurological: She is alert and oriented to person, place, and time. No cranial nerve deficit. Coordination normal.  Skin: Skin is dry. No pallor.  Psychiatric: She has a normal mood and affect. Her behavior is normal.  Vitals reviewed.      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  We discussed the diagnosis.  Certainly things can change in altered over time and new symptoms can appear but right now based on her description and  her exam is most consistent with essential tremor.  We will do some labs just to rule out any other causes.  Symptoms not consistent with Parkinson's disease and we reviewed this today.  Though she does have a jaw tremor that she reports though I am unable to witness it today which is more common with Parkinson's disease that this is a little unusual.  We will have to definitely follow her very carefully.  Also encouraged her to look out for major changes such as worsening of the tremor, rigidity, increased falls, mental status changes, numbness tingling or paresthesias all of which would be unusual.  Amenorrhea - will check levels to see if he postmenopausal or perimenopausal.

## 2018-05-17 LAB — ESTRADIOL: Estradiol: 61 pg/mL

## 2018-05-17 LAB — COMPLETE METABOLIC PANEL WITH GFR
AG RATIO: 1.6 (calc) (ref 1.0–2.5)
ALBUMIN MSPROF: 4.1 g/dL (ref 3.6–5.1)
ALT: 17 U/L (ref 6–29)
AST: 16 U/L (ref 10–35)
Alkaline phosphatase (APISO): 66 U/L (ref 33–115)
BILIRUBIN TOTAL: 1 mg/dL (ref 0.2–1.2)
BUN: 16 mg/dL (ref 7–25)
CHLORIDE: 101 mmol/L (ref 98–110)
CO2: 27 mmol/L (ref 20–32)
Calcium: 9.3 mg/dL (ref 8.6–10.2)
Creat: 0.93 mg/dL (ref 0.50–1.10)
GFR, EST AFRICAN AMERICAN: 85 mL/min/{1.73_m2} (ref >=60)
GFR, Est Non African American: 73 mL/min/{1.73_m2} (ref >=60)
GLOBULIN: 2.6 g/dL (ref 1.9–3.7)
Glucose, Bld: 112 mg/dL — ABNORMAL HIGH (ref 65–99)
Potassium: 3.9 mmol/L (ref 3.5–5.3)
SODIUM: 137 mmol/L (ref 135–146)
TOTAL PROTEIN: 6.7 g/dL (ref 6.1–8.1)

## 2018-05-17 LAB — MAGNESIUM: Magnesium: 2.1 mg/dL (ref 1.5–2.5)

## 2018-05-17 LAB — HEMOGLOBIN A1C
EAG (MMOL/L): 6 (calc)
Hgb A1c MFr Bld: 5.4 % of total Hgb (ref <5.7)
MEAN PLASMA GLUCOSE: 108 (calc)

## 2018-05-17 LAB — FOLLICLE STIMULATING HORMONE: FSH: 24.3 m[IU]/mL

## 2018-05-17 LAB — TSH: TSH: 4.38 mIU/L

## 2018-05-17 LAB — VITAMIN D 25 HYDROXY (VIT D DEFICIENCY, FRACTURES): Vit D, 25-Hydroxy: 42 ng/mL (ref 30–100)

## 2018-05-17 LAB — PROGESTERONE: Progesterone: <0.5 ng/mL

## 2018-05-17 LAB — B12 AND FOLATE PANEL: Vitamin B-12: 422 pg/mL (ref 200–1100)

## 2018-05-17 LAB — CK: Total CK: 170 U/L — ABNORMAL HIGH (ref 29–143)

## 2018-05-17 LAB — LUTEINIZING HORMONE: LH: 16.4 m[IU]/mL

## 2018-05-18 ENCOUNTER — Ambulatory Visit (INDEPENDENT_AMBULATORY_CARE_PROVIDER_SITE_OTHER): Payer: Managed Care, Other (non HMO)

## 2018-05-18 DIAGNOSIS — Z1231 Encounter for screening mammogram for malignant neoplasm of breast: Secondary | ICD-10-CM

## 2018-05-19 ENCOUNTER — Other Ambulatory Visit: Payer: Self-pay | Admitting: *Deleted

## 2018-05-19 ENCOUNTER — Telehealth: Payer: Self-pay | Admitting: Family Medicine

## 2018-05-19 DIAGNOSIS — E039 Hypothyroidism, unspecified: Secondary | ICD-10-CM

## 2018-05-19 NOTE — Telephone Encounter (Signed)
Spoke with patient in regards to her lab results.  She has been taking the thyroid medication daily as prescribed so please send new dose to pharmacy.  She also asked what the next step would be for the shaking that she is experiencing ? Essential tremors? Please advise. KG LPN

## 2018-05-22 NOTE — Telephone Encounter (Signed)
K to increase thyroid medication by taking an extra half a tab 1 day weeks.  For example taking one half tabs every Saturday and just a whole tab the other 6 days a week.  Repeat each week and then plan to recheck TSH in about 6 weeks.  In regards to the tremor if she would like to try medication we can certainly do that.  It is not a cure it does, helps control the tremor but if she is open to that I can send something in a week and try it and then have her follow-up in about 6 to 8 weeks to see how she is doing with it.

## 2018-05-23 NOTE — Telephone Encounter (Signed)
Left detailed vm w/recommendations and asked that she rtn call to the clinic in regards to medication for the tremor, or if she has any questions about how to take thyroid medication. Lab ordered and faxed.Marland KitchenMarland KitchenAudelia Hives [place]

## 2018-05-30 NOTE — Telephone Encounter (Signed)
Lets try to call her one more time about the tremor. I did reach out to Dr. Carles Collet, Neurology about her case.

## 2018-06-01 NOTE — Telephone Encounter (Signed)
Left second VM for Pt to return clinic call.

## 2018-06-02 ENCOUNTER — Telehealth: Payer: Self-pay | Admitting: Family Medicine

## 2018-06-02 NOTE — Telephone Encounter (Signed)
-----   Message from [place], DO sent at 05/31/2018  7:22 AM EDT ----- I have definitely seen jaw tremor with ET, but it doesn't respond well to meds and IF that is only complaint, I wouldn't give med for it.  If hand/leg tremor is the complaint, then meds certainly could help but won't help the chin/jaw much.  Hope that helps! Wells Guiles ----- Message ----- From: Hali Marry, MD Sent: 05/30/2018   5:06 PM EDT To: Eustace Quail Tat, DO  HI Dr. Carles Collet,   I have a quick question for you.  I have this patient above who came in with what looks completely like an essential tremor.  No resting tremor whatsoever but 1 of her complaints was that she actually had a "tremor in her lower jaw".  This is highly unusual for essential tremor and is often seen with Parkinson's so I wanted to get your thoughts on this.  Should I try her on medication for essential tremor and see how she responds.  She did have what almost felt like a little faint cogwheeling in her arm but I could not quite convince myself if it was truly there or not.  What do you think?  Thank you,   Barnetta Chapel

## 2018-06-02 NOTE — Telephone Encounter (Signed)
Called patient and spoke directly to her.  She did get the message about the change for her thyroid medication and has Artie made adjustments.  We discussed pros and cons of medication and I did give her the additional information about the jaw tremor.  At this point in time she feels like the symptoms are quite disturbing or uncomfortable enough that she wants to take medication but certainly if at any point she changes her mind we can give her a trial of either propranolol or primidone.

## 2018-06-05 NOTE — Telephone Encounter (Signed)
pcp spoke w/pt.Maryruth Eve, Lahoma Crocker, CMA

## 2018-06-22 ENCOUNTER — Other Ambulatory Visit: Payer: Self-pay | Admitting: *Deleted

## 2018-06-22 DIAGNOSIS — E039 Hypothyroidism, unspecified: Secondary | ICD-10-CM

## 2018-06-22 DIAGNOSIS — K219 Gastro-esophageal reflux disease without esophagitis: Secondary | ICD-10-CM

## 2018-06-22 DIAGNOSIS — F321 Major depressive disorder, single episode, moderate: Secondary | ICD-10-CM

## 2018-06-22 MED ORDER — DULOXETINE HCL 30 MG PO CPEP: 60.0000 mg | ORAL_CAPSULE | Freq: Every day | ORAL | 1 refills | Status: DC

## 2018-06-22 MED ORDER — OMEPRAZOLE 20 MG PO CPDR: 20.0000 mg | DELAYED_RELEASE_CAPSULE | Freq: Every day | ORAL | 3 refills | Status: DC

## 2018-06-22 MED ORDER — LEVOTHYROXINE SODIUM 75 MCG PO TABS: ORAL_TABLET | ORAL | 1 refills | Status: DC

## 2018-06-22 MED ORDER — BUPROPION HCL ER (XL) 300 MG PO TB24: 300.0000 mg | ORAL_TABLET | Freq: Every day | ORAL | 1 refills | Status: DC

## 2018-07-04 ENCOUNTER — Ambulatory Visit (INDEPENDENT_AMBULATORY_CARE_PROVIDER_SITE_OTHER): Payer: Managed Care, Other (non HMO)

## 2018-07-04 ENCOUNTER — Other Ambulatory Visit: Payer: Self-pay | Admitting: Physician Assistant

## 2018-07-04 DIAGNOSIS — M25462 Effusion, left knee: Secondary | ICD-10-CM

## 2018-07-18 ENCOUNTER — Other Ambulatory Visit: Payer: Self-pay | Admitting: Family Medicine

## 2018-08-15 ENCOUNTER — Ambulatory Visit (INDEPENDENT_AMBULATORY_CARE_PROVIDER_SITE_OTHER): Payer: Managed Care, Other (non HMO) | Admitting: Family Medicine

## 2018-08-15 ENCOUNTER — Encounter: Payer: Self-pay | Admitting: Family Medicine

## 2018-08-15 VITALS — BP 139/79 | HR 106 | Ht 64.21 in | Wt 192.0 lb

## 2018-08-15 DIAGNOSIS — E039 Hypothyroidism, unspecified: Secondary | ICD-10-CM

## 2018-08-15 DIAGNOSIS — R251 Tremor, unspecified: Secondary | ICD-10-CM | POA: Diagnosis not present

## 2018-08-15 NOTE — Progress Notes (Signed)
   Subjective:    Patient ID: Nancy Wang, female    DOB: [birth year], 47 y.o.   MRN: [id number]  HPI 47 year old female is here today to follow-up for benign essential tremor.  She is doing well overall.  She says she has not noticed the jaw tremor nearly as much as when she had come in previously.  Still noticing occasional hand tremor she is noticing that there are certain things that exacerbated such as stress and anxiety.  She is at the point where she is not wanting to take a medication daily for it.   Follow-up hypothyroidism-when we last checked her TSH.  Her level was just a little elevated at 4.3.  We added an extra half a tab per week.  She admits she has not been taking extra half consistently.  Review of Systems     Objective:   Physical Exam  Constitutional: She is oriented to person, place, and time. She appears well-developed and well-nourished.  HENT:  Head: Normocephalic and atraumatic.  Cardiovascular: Normal rate, regular rhythm and normal heart sounds.  Pulmonary/Chest: Effort normal and breath sounds normal.  Neurological: She is alert and oriented to person, place, and time.  Skin: Skin is warm and dry.  Psychiatric: She has a normal mood and affect. Her behavior is normal.        Assessment & Plan:  Hypothyroidism - due to recheck TSH in a few week after restarts additional 1/2 tab weekly.  In addition to regular dose.  Essential tremor-we will just continue to monitor periodically.  Right now she does not want to take daily medication we explained that does not really alter the course of the disease and just controls the symptoms.  She is happy just to monitor for now.

## 2018-10-05 DIAGNOSIS — M0589 Other rheumatoid arthritis with rheumatoid factor of multiple sites: Secondary | ICD-10-CM | POA: Diagnosis not present

## 2018-10-10 ENCOUNTER — Ambulatory Visit (INDEPENDENT_AMBULATORY_CARE_PROVIDER_SITE_OTHER): Payer: BLUE CROSS/BLUE SHIELD | Admitting: Family Medicine

## 2018-10-10 ENCOUNTER — Ambulatory Visit (INDEPENDENT_AMBULATORY_CARE_PROVIDER_SITE_OTHER): Payer: BLUE CROSS/BLUE SHIELD

## 2018-10-10 ENCOUNTER — Encounter: Payer: Self-pay | Admitting: Family Medicine

## 2018-10-10 VITALS — BP 129/81 | HR 105 | Temp 99.3°F | Ht 64.0 in | Wt 200.0 lb

## 2018-10-10 DIAGNOSIS — R509 Fever, unspecified: Secondary | ICD-10-CM | POA: Diagnosis not present

## 2018-10-10 DIAGNOSIS — R5383 Other fatigue: Secondary | ICD-10-CM

## 2018-10-10 DIAGNOSIS — R05 Cough: Secondary | ICD-10-CM

## 2018-10-10 DIAGNOSIS — R059 Cough, unspecified: Secondary | ICD-10-CM

## 2018-10-10 DIAGNOSIS — M47812 Spondylosis without myelopathy or radiculopathy, cervical region: Secondary | ICD-10-CM | POA: Diagnosis not present

## 2018-10-10 MED ORDER — BENZONATATE 200 MG PO CAPS: 200.0000 mg | ORAL_CAPSULE | Freq: Two times a day (BID) | ORAL | 0 refills | Status: DC | PRN

## 2018-10-10 NOTE — Progress Notes (Signed)
Acute Office Visit  Subjective:    Patient ID: Nancy Wang, female    DOB: 12/06/1970, 48 y.o.   MRN: [id number]  Chief Complaint  Patient presents with  . Fever    sxs x 1.5 wks, on and off she isn't sure if this is due to her Rheumatoid Arthritis flare. She had a low grade temp yesterday 99.6 she also had a low grade temp when she went for her infusion on thursday   . Fatigue    HPI Patient is in today for sxs x 1.5 wks, on and off she isn't sure if this is due to her Rheumatoid Arthritis flare. She had a low grade temp yesterday 99.6 she also had a low grade temp when she went for her infusion on Thursday.    She feels hot and cold.  She is also had a dry intermittent cough.  She says some days she barely notices it and then other days she coughs frequently.  She denies any sinus congestion or pressure or drainage.  She denies any urinary symptoms or wounds.  He did have 1 day of urinary frequency over the weekend but says that seemed to resolve on its own.  She just feels extremely tired.  She said she slept for 12 hours on Saturday and Sunday.  She occasionally feels a little bit of tightness in her chest.  She is on Simponi infusions for her rheumatoid arthritis.  She thought initially that might be causing her some of her symptoms she usually feels tired for couple days afterwards but her symptoms have persisted.  Past Medical History:  Diagnosis Date  . Bronchitis   . GERD (gastroesophageal reflux disease)   . Pseudogout    Dr. Ouida Sills  . RA (rheumatoid arthritis) ([place])    Follow by Tobie Lords, MD - [facility]  . Thyroid disease    hypothyroidism    Past Surgical History:  Procedure Laterality Date  . ABDOMINAL SURGERY    . arthroscopic surgery     both knees, RT 1985 removed patella, LT 1988  . ESSURE TUBAL LIGATION    . exploratory lap  1985, 2004   for ovarian cysts, infertility    Family History  Problem Relation Age of Onset  .  Cancer Father        prostate  . Coronary artery disease Father   . Depression Father   . Hypertension Mother   . Diabetes Mother   . Diabetes Unknown        family history  . Hypertension Unknown        family history    Social History   Socioeconomic History  . Marital status: Married    Spouse name: Not on file  . Number of children: Not on file  . Years of education: Not on file  . Highest education level: Not on file  Occupational History  . Not on file  Social Needs  . Financial resource strain: Not on file  . Food insecurity:    Worry: Not on file    Inability: Not on file  . Transportation needs:    Medical: Not on file    Non-medical: Not on file  Tobacco Use  . Smoking status: Never Smoker  . Smokeless tobacco: Never Used  Substance and Sexual Activity  . Alcohol use: Yes  . Drug use: No  . Sexual activity: Not on file    Comment: married, no regular exercise, limited by knee pain, has  son.   Lifestyle  . Physical activity:    Days per week: Not on file    Minutes per session: Not on file  . Stress: Not on file  Relationships  . Social connections:    Talks on phone: Not on file    Gets together: Not on file    Attends religious service: Not on file    Active member of club or organization: Not on file    Attends meetings of clubs or organizations: Not on file    Relationship status: Not on file  . Intimate partner violence:    Fear of current or ex partner: Not on file    Emotionally abused: Not on file    Physically abused: Not on file    Forced sexual activity: Not on file  Other Topics Concern  . Not on file  Social History Narrative  . Not on file    Outpatient Medications Prior to Visit  Medication Sig Dispense Refill  . albuterol (PROVENTIL HFA;VENTOLIN HFA) 108 (90 Base) MCG/ACT inhaler Inhale 1-2 puffs into the lungs every 6 hours as needed for bronchospasm  18 Inhaler 3  . buPROPion (WELLBUTRIN XL) 300 MG 24 hr tablet Take 1 tablet  (300 mg total) by mouth daily. 90 tablet 1  . COLCRYS 0.6 MG tablet Take 0.6 mg by mouth daily.  2  . DULoxetine (CYMBALTA) 30 MG capsule Take 2 capsules (60 mg total) by mouth daily. 353 capsule 1  . folic acid (FOLVITE) 1 MG tablet     . Golimumab ([place] ARIA IV) Inject into the vein.    Marland Kitchen ipratropium (ATROVENT) 0.06 % nasal spray Place 2 sprays into both nostrils 4 (four) times daily. 15 mL 1  . levothyroxine (SYNTHROID, LEVOTHROID) 75 MCG tablet TAKE 1 TABLET BY MOUTH EVERY DAY BEFORE BREAKFAST 90 tablet 1  . methotrexate (RHEUMATREX) 2.5 MG tablet Take 2.5 mg by mouth once a week. Take 9 tablets by mouth once a week    . omeprazole (PRILOSEC) 20 MG capsule Take 1 capsule (20 mg total) by mouth daily. 90 capsule 3   No facility-administered medications prior to visit.     Allergies  Allergen Reactions  . Citalopram Other (See Comments)    QT prolongation  . Morphine Sulfate     ROS     Objective:    Physical Exam  Constitutional: She is oriented to person, place, and time. She appears well-developed and well-nourished.  HENT:  Head: Normocephalic and atraumatic.  Right Ear: External ear normal.  Left Ear: External ear normal.  Nose: Nose normal.  Mouth/Throat: Oropharynx is clear and moist.  TMs and canals are clear.   Eyes: Pupils are equal, round, and reactive to light. Conjunctivae and EOM are normal.  Neck: Neck supple. No thyromegaly present.  Cardiovascular: Normal rate, regular rhythm and normal heart sounds.  Pulmonary/Chest: Effort normal and breath sounds normal. She has no wheezes.  Lymphadenopathy:    She has no cervical adenopathy.  Neurological: She is alert and oriented to person, place, and time.  Skin: Skin is warm and dry.  Psychiatric: She has a normal mood and affect.    BP 129/81   Pulse (!) 105   Temp 99.3 F (37.4 C)   Ht 5\' 4"  (1.626 m)   Wt 200 lb (90.7 kg)   LMP 09/26/2018   SpO2 97%   BMI 34.33 kg/m  Wt Readings from Last 3  Encounters:  10/10/18 200 lb (90.7 kg)  08/15/18 192 lb (87.1 kg)  05/16/18 186 lb (84.4 kg)    Health Maintenance Due  Topic Date Due  . HIV Screening  12/13/1985    There are no preventive care reminders to display for this patient.   Lab Results  Component Value Date   TSH 4.38 05/16/2018   Lab Results  Component Value Date   WBC 12.8 (H) 10/19/2017   HGB 14.9 10/19/2017   HCT 43.8 10/19/2017   MCV 94.4 10/19/2017   PLT 342 10/19/2017   Lab Results  Component Value Date   NA 137 05/16/2018   K 3.9 05/16/2018   CO2 27 05/16/2018   GLUCOSE 112 (H) 05/16/2018   BUN 16 05/16/2018   CREATININE 0.93 05/16/2018   BILITOT 1.0 05/16/2018   ALKPHOS 74 12/24/2013   AST 16 05/16/2018   ALT 17 05/16/2018   PROT 6.7 05/16/2018   ALBUMIN 3.9 12/24/2013   CALCIUM 9.3 05/16/2018   Lab Results  Component Value Date   CHOL 170 12/24/2013   Lab Results  Component Value Date   HDL 42 12/24/2013   Lab Results  Component Value Date   LDLCALC 105 (H) 12/24/2013   Lab Results  Component Value Date   TRIG 113 12/24/2013   Lab Results  Component Value Date   CHOLHDL 4.0 12/24/2013   Lab Results  Component Value Date   HGBA1C 5.4 05/16/2018       Assessment & Plan:   Problem List Items Addressed This Visit    None    Visit Diagnoses    Cough    -  Primary   Relevant Orders   DG Cervical Spine Complete   DG Chest 2 View   CBC with Differential/Platelet   Fatigue, unspecified type       Relevant Orders   DG Cervical Spine Complete   DG Chest 2 View   CBC with Differential/Platelet   Fever, unspecified fever cause       Relevant Orders   DG Cervical Spine Complete   DG Chest 2 View   CBC with Differential/Platelet     Cough, fever and fatigue will work-up further with CBC with differential and chest x-ray to rule out pneumonia or underlying bronchitis.  He did have a recent negative TB test which is reassuring.  Call if symptoms do not improve or worsen.   Can eyes: Some Tessalon Perles for the cough.  No orders of the defined types were placed in this encounter.    Beatrice Lecher, MD

## 2018-10-11 ENCOUNTER — Encounter: Payer: Self-pay | Admitting: Family Medicine

## 2018-10-11 DIAGNOSIS — R05 Cough: Secondary | ICD-10-CM | POA: Diagnosis not present

## 2018-10-11 DIAGNOSIS — R509 Fever, unspecified: Secondary | ICD-10-CM | POA: Diagnosis not present

## 2018-10-11 DIAGNOSIS — R5383 Other fatigue: Secondary | ICD-10-CM | POA: Diagnosis not present

## 2018-10-11 LAB — CBC WITH DIFFERENTIAL/PLATELET
ABSOLUTE MONOCYTES: 780 {cells}/uL (ref 200–950)
BASOS PCT: 1.1 %
Basophils Absolute: 103 cells/uL (ref 0–200)
EOS ABS: 273 {cells}/uL (ref 15–500)
Eosinophils Relative: 2.9 %
HEMATOCRIT: 40.1 % (ref 35.0–45.0)
Hemoglobin: 13.9 g/dL (ref 11.7–15.5)
LYMPHS ABS: 2839 {cells}/uL (ref 850–3900)
MCH: 33.1 pg — AB (ref 27.0–33.0)
MCHC: 34.7 g/dL (ref 32.0–36.0)
MCV: 95.5 fL (ref 80.0–100.0)
MPV: 10.4 fL (ref 7.5–12.5)
Monocytes Relative: 8.3 %
NEUTROS PCT: 57.5 %
Neutro Abs: 5405 cells/uL (ref 1500–7800)
PLATELETS: 287 10*3/uL (ref 140–400)
RBC: 4.2 10*6/uL (ref 3.80–5.10)
RDW: 12.7 % (ref 11.0–15.0)
TOTAL LYMPHOCYTE: 30.2 %
WBC: 9.4 10*3/uL (ref 3.8–10.8)

## 2018-10-12 ENCOUNTER — Other Ambulatory Visit: Payer: Self-pay | Admitting: Family Medicine

## 2018-10-12 ENCOUNTER — Encounter: Payer: Self-pay | Admitting: Family Medicine

## 2018-10-12 MED ORDER — PREDNISONE 20 MG PO TABS: 40.0000 mg | ORAL_TABLET | Freq: Every day | ORAL | 0 refills | Status: DC

## 2018-10-16 MED ORDER — HYDROCODONE-HOMATROPINE 5-1.5 MG/5ML PO SYRP: 5.0000 mL | ORAL_SOLUTION | Freq: Every evening | ORAL | 0 refills | Status: DC | PRN

## 2018-10-16 MED ORDER — DOXYCYCLINE HYCLATE 100 MG PO TABS: 100.0000 mg | ORAL_TABLET | Freq: Two times a day (BID) | ORAL | 0 refills | Status: DC

## 2018-10-16 NOTE — Telephone Encounter (Signed)
Meds sent

## 2018-10-17 ENCOUNTER — Encounter: Payer: Self-pay | Admitting: Family Medicine

## 2018-10-17 ENCOUNTER — Ambulatory Visit (INDEPENDENT_AMBULATORY_CARE_PROVIDER_SITE_OTHER): Payer: BLUE CROSS/BLUE SHIELD | Admitting: Family Medicine

## 2018-10-17 VITALS — BP 123/66 | HR 104 | Temp 99.1°F | Ht 64.0 in | Wt 200.0 lb

## 2018-10-17 DIAGNOSIS — R509 Fever, unspecified: Secondary | ICD-10-CM

## 2018-10-17 DIAGNOSIS — R059 Cough, unspecified: Secondary | ICD-10-CM

## 2018-10-17 DIAGNOSIS — R05 Cough: Secondary | ICD-10-CM | POA: Diagnosis not present

## 2018-10-17 LAB — POCT INFLUENZA A/B
INFLUENZA A, POC: NEGATIVE
INFLUENZA B, POC: NEGATIVE

## 2018-10-17 NOTE — Progress Notes (Signed)
Acute Office Visit  Subjective:    Patient ID: Nancy Wang, female    DOB: Apr 19, 1971, 48 y.o.   MRN: [id number]  Chief Complaint  Patient presents with  . Fever    HPI Patient is in today for cough.  I last saw her on January 14 about a week ago at that point she had Artie had symptoms for about 1-1/2 weeks.  She felt like it was on and off but she just felt very fatigued and was running a low-grade temperature as well as having a persistent cough.  She was feeling hot and cold.  We did do a CBC and white count was normal at 9.4.  Chest x-ray was negative for any type of bronchitis or pneumonia.  Have some cough medication and prednisone but unfortunately she did not improve.  She called back on Monday the 19th.  Unfortunately we were unable to get her in for an appointment yesterday so we did call in azithromycin.  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  Feels like the prednisone was not helpful at all for the cough.  She did go ahead and pick up the prescription for the azithromycin and took her first dose yesterday.  She still been running some low-grade temperatures between 99 and 100.2.  Last fever was yesterday.  The cough is still mostly dry and barking.     Past Medical History:  Diagnosis Date  . Bronchitis   . GERD (gastroesophageal reflux disease)   . Pseudogout    Dr. Ouida Sills  . RA (rheumatoid arthritis) ([place])    Follow by Tobie Lords, MD - [facility]  . Thyroid disease    hypothyroidism    Past Surgical History:  Procedure Laterality Date  . ABDOMINAL SURGERY    . arthroscopic surgery     both knees, RT 1985 removed patella, LT 1988  . ESSURE TUBAL LIGATION    . exploratory lap  1985, 2004   for ovarian cysts, infertility    Family History  Problem Relation Age of Onset  . Cancer Father        prostate  . Coronary artery disease Father   . Depression Father   . Hypertension Mother   . Diabetes Mother    . Diabetes Unknown        family history  . Hypertension Unknown        family history    Social History   Socioeconomic History  . Marital status: Married    Spouse name: Not on file  . Number of children: Not on file  . Years of education: Not on file  . Highest education level: Not on file  Occupational History  . Not on file  Social Needs  . Financial resource strain: Not on file  . Food insecurity:    Worry: Not on file    Inability: Not on file  . Transportation needs:    Medical: Not on file    Non-medical: Not on file  Tobacco Use  . Smoking status: Never Smoker  . Smokeless tobacco: Never Used  Substance and Sexual Activity  . Alcohol use: Yes  . Drug use: No  . Sexual activity: Not on file    Comment: married, no regular exercise, limited by knee pain, has son.   Lifestyle  . Physical activity:    Days per week: Not on file    Minutes per session: Not on file  . Stress: Not on  file  Relationships  . Social connections:    Talks on phone: Not on file    Gets together: Not on file    Attends religious service: Not on file    Active member of club or organization: Not on file    Attends meetings of clubs or organizations: Not on file    Relationship status: Not on file  . Intimate partner violence:    Fear of current or ex partner: Not on file    Emotionally abused: Not on file    Physically abused: Not on file    Forced sexual activity: Not on file  Other Topics Concern  . Not on file  Social History Narrative  . Not on file    Outpatient Medications Prior to Visit  Medication Sig Dispense Refill  . albuterol (PROVENTIL HFA;VENTOLIN HFA) 108 (90 Base) MCG/ACT inhaler Inhale 1-2 puffs into the lungs every 6 hours as needed for bronchospasm  18 Inhaler 3  . buPROPion (WELLBUTRIN XL) 300 MG 24 hr tablet Take 1 tablet (300 mg total) by mouth daily. 90 tablet 1  . COLCRYS 0.6 MG tablet Take 0.6 mg by mouth daily.  2  . doxycycline (VIBRA-TABS) 100 MG  tablet Take 1 tablet (100 mg total) by mouth 2 (two) times daily. 20 tablet 0  . DULoxetine (CYMBALTA) 30 MG capsule Take 2 capsules (60 mg total) by mouth daily. 254 capsule 1  . folic acid (FOLVITE) 1 MG tablet     . Golimumab ([place] ARIA IV) Inject into the vein.    Marland Kitchen HYDROcodone-homatropine (HYCODAN) 5-1.5 MG/5ML syrup Take 5 mLs by mouth at bedtime as needed for cough. 120 mL 0  . ipratropium (ATROVENT) 0.06 % nasal spray Place 2 sprays into both nostrils 4 (four) times daily. 15 mL 1  . levothyroxine (SYNTHROID, LEVOTHROID) 75 MCG tablet TAKE 1 TABLET BY MOUTH EVERY DAY BEFORE BREAKFAST 90 tablet 1  . methotrexate (RHEUMATREX) 2.5 MG tablet Take 2.5 mg by mouth once a week. Take 9 tablets by mouth once a week    . omeprazole (PRILOSEC) 20 MG capsule Take 1 capsule (20 mg total) by mouth daily. 90 capsule 3  . benzonatate (TESSALON) 200 MG capsule Take 1 capsule (200 mg total) by mouth 2 (two) times daily as needed for cough. 20 capsule 0  . predniSONE (DELTASONE) 20 MG tablet Take 2 tablets (40 mg total) by mouth daily with breakfast. 10 tablet 0   No facility-administered medications prior to visit.     Allergies  Allergen Reactions  . Citalopram Other (See Comments)    QT prolongation  . Morphine Sulfate     ROS     Objective:    Physical Exam  Constitutional: She is oriented to person, place, and time. She appears well-developed and well-nourished.  HENT:  Head: Normocephalic and atraumatic.  Right Ear: External ear normal.  Left Ear: External ear normal.  Nose: Nose normal.  Mouth/Throat: Oropharynx is clear and moist.  TMs and canals are clear.   Eyes: Pupils are equal, round, and reactive to light. Conjunctivae and EOM are normal.  Neck: Neck supple. No thyromegaly present.  Cardiovascular: Normal rate, regular rhythm and normal heart sounds.  Pulmonary/Chest: Effort normal and breath sounds normal. She has no wheezes.  Lymphadenopathy:    She has no cervical  adenopathy.  Neurological: She is alert and oriented to person, place, and time.  Skin: Skin is warm and dry.  Psychiatric: She has a normal mood and  affect.    BP 123/66   Pulse (!) 104   Temp 99.1 F (37.3 C)   Ht 5\' 4"  (1.626 m)   Wt 200 lb (90.7 kg)   LMP 09/26/2018   SpO2 97%   BMI 34.33 kg/m  Wt Readings from Last 3 Encounters:  10/17/18 200 lb (90.7 kg)  10/10/18 200 lb (90.7 kg)  08/15/18 192 lb (87.1 kg)    Health Maintenance Due  Topic Date Due  . HIV Screening  12/13/1985    There are no preventive care reminders to display for this patient.   Lab Results  Component Value Date   TSH 4.38 05/16/2018   Lab Results  Component Value Date   WBC 9.4 10/11/2018   HGB 13.9 10/11/2018   HCT 40.1 10/11/2018   MCV 95.5 10/11/2018   PLT 287 10/11/2018   Lab Results  Component Value Date   NA 137 05/16/2018   K 3.9 05/16/2018   CO2 27 05/16/2018   GLUCOSE 112 (H) 05/16/2018   BUN 16 05/16/2018   CREATININE 0.93 05/16/2018   BILITOT 1.0 05/16/2018   ALKPHOS 74 12/24/2013   AST 16 05/16/2018   ALT 17 05/16/2018   PROT 6.7 05/16/2018   ALBUMIN 3.9 12/24/2013   CALCIUM 9.3 05/16/2018   Lab Results  Component Value Date   CHOL 170 12/24/2013   Lab Results  Component Value Date   HDL 42 12/24/2013   Lab Results  Component Value Date   LDLCALC 105 (H) 12/24/2013   Lab Results  Component Value Date   TRIG 113 12/24/2013   Lab Results  Component Value Date   CHOLHDL 4.0 12/24/2013   Lab Results  Component Value Date   HGBA1C 5.4 05/16/2018       Assessment & Plan:   Problem List Items Addressed This Visit    None    Visit Diagnoses    Cough    -  Primary   Relevant Orders   POCT Influenza A/B (Completed)   Culture, Bordetella   Bordetella pertussis PCR   Fever, unspecified fever cause       Relevant Orders   POCT Influenza A/B (Completed)     Go ahead and swab for flu today since she is had some persistent low-grade fevers.  She  is technically immunosuppressed because of the medications that she is on for her rheumatoid.  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 No improvement at all on prednisone. Work note given.   She is running a low-grade temperature here today.   No orders of the defined types were placed in this encounter.    Beatrice Lecher, MD

## 2018-10-19 LAB — BORDETELLA PERTUSSIS PCR
B. PERTUSSIS DNA: NOT DETECTED
B. parapertussis DNA: NOT DETECTED

## 2018-10-23 ENCOUNTER — Encounter: Payer: Self-pay | Admitting: Family Medicine

## 2018-10-23 DIAGNOSIS — R509 Fever, unspecified: Secondary | ICD-10-CM

## 2018-10-23 DIAGNOSIS — R05 Cough: Secondary | ICD-10-CM

## 2018-10-23 DIAGNOSIS — R059 Cough, unspecified: Secondary | ICD-10-CM

## 2018-10-24 ENCOUNTER — Ambulatory Visit (INDEPENDENT_AMBULATORY_CARE_PROVIDER_SITE_OTHER): Payer: BLUE CROSS/BLUE SHIELD

## 2018-10-24 DIAGNOSIS — R05 Cough: Secondary | ICD-10-CM

## 2018-10-24 DIAGNOSIS — R911 Solitary pulmonary nodule: Secondary | ICD-10-CM

## 2018-10-24 DIAGNOSIS — R059 Cough, unspecified: Secondary | ICD-10-CM

## 2018-10-24 DIAGNOSIS — R509 Fever, unspecified: Secondary | ICD-10-CM

## 2018-10-25 ENCOUNTER — Encounter: Payer: Self-pay | Admitting: Family Medicine

## 2018-10-25 ENCOUNTER — Other Ambulatory Visit: Payer: Self-pay | Admitting: Family Medicine

## 2018-10-25 MED ORDER — AMOXICILLIN-POT CLAVULANATE 875-125 MG PO TABS: 1.0000 | ORAL_TABLET | Freq: Two times a day (BID) | ORAL | 0 refills | Status: DC

## 2018-10-27 LAB — CULTURE, BORDETELLA PERTUSSIS

## 2018-10-30 ENCOUNTER — Ambulatory Visit (INDEPENDENT_AMBULATORY_CARE_PROVIDER_SITE_OTHER): Payer: BLUE CROSS/BLUE SHIELD | Admitting: Internal Medicine

## 2018-10-30 ENCOUNTER — Encounter: Payer: Self-pay | Admitting: Internal Medicine

## 2018-10-30 VITALS — BP 132/84 | HR 118 | Temp 98.3°F | Ht 66.0 in | Wt 191.2 lb

## 2018-10-30 DIAGNOSIS — R911 Solitary pulmonary nodule: Secondary | ICD-10-CM | POA: Diagnosis not present

## 2018-10-30 DIAGNOSIS — R05 Cough: Secondary | ICD-10-CM

## 2018-10-30 DIAGNOSIS — R058 Other specified cough: Secondary | ICD-10-CM | POA: Insufficient documentation

## 2018-10-30 MED ORDER — HYDROCODONE-ACETAMINOPHEN 5-300 MG PO TABS: ORAL_TABLET | ORAL | 0 refills | Status: DC

## 2018-10-30 MED ORDER — PREDNISONE 10 MG PO TABS: ORAL_TABLET | ORAL | 0 refills | Status: DC

## 2018-10-30 MED ORDER — PANTOPRAZOLE SODIUM 40 MG PO TBEC: 40.0000 mg | DELAYED_RELEASE_TABLET | Freq: Every day | ORAL | 2 refills | Status: DC

## 2018-10-30 NOTE — Progress Notes (Addendum)
Nancy Wang, female    DOB: 20-Apr-1971,    MRN: [id number]   Brief patient profile:  48 yowf never smoker with newborn pna in [place] born at or late term and pt hosp at least once a year req 02 x about a week up until age 48 and better p left [place] but since then around once a year "bronchitis" some better since moved to [facility] around 2008.  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 Consult    Non productive cough with fever almost 4 weeks.  acute onset x 4 weeks fatigue, intermittent fever ,  Cough onset x 3 weeks harsh barking dry quality s st or over nasal symptoms  Better when lie down/ stay still / better with menthol based cough drops while using them - only sob when coughing  Flu/ pertussis neg  augmentin  10/25/18 x 21 days rec  No obvious day to day or daytime variability or assoc excess/ purulent sputum or mucus plugs or hemoptysis or cp or chest tightness, subjective wheeze or overt sinus or hb symptoms.   Sleeping most nocts  without nocturnal  or early am exacerbation  of respiratory  c/o's or need for noct saba. Also denies any obvious fluctuation of symptoms with weather or environmental changes or other aggravating or alleviating factors except as outlined above   No unusual exposure hx or h/o  premature birth (says was definitely late term)  Current Allergies, Complete Past Medical History, Past Surgical History, Family History, and Social History were reviewed in [employer] record.  ROS  The following are not active complaints unless bolded Hoarseness, sore throat, dysphagia, dental problems, itching, sneezing,  nasal congestion or  discharge of excess mucus or purulent secretions, ear ache,   fever, chills, sweats, unintended wt loss or wt gain, classically pleuritic or exertional cp,  orthopnea pnd or arm/hand swelling  or leg swelling, presyncope, palpitations, abdominal pain, anorexia, nausea, vomiting, diarrhea  or change in bowel habits or change in bladder habits, change in stools or change in urine, dysuria, hematuria,  rash, arthralgias no worse than baseline, visual complaints, headache, numbness, weakness or ataxia or problems with walking or coordination,  change in mood or  memory.           Past Medical History:  Diagnosis Date  . Bronchitis   . GERD (gastroesophageal reflux disease)   . Pseudogout    Dr. Ouida Sills  . RA (rheumatoid arthritis) ([place])    Follow by Tobie Lords, [facility] - [facility]  . Thyroid disease    hypothyroidism    Outpatient Medications Prior to Visit  Medication Sig Dispense Refill  . albuterol (PROVENTIL HFA;VENTOLIN HFA) 108 (90 Base) MCG/ACT inhaler Inhale 1-2 puffs into the lungs every 6 hours as needed for bronchospasm  18 Inhaler 3  . amoxicillin-clavulanate (AUGMENTIN) 875-125 MG tablet Take 1 tablet by mouth 2 (two) times daily. 42 tablet 0  . buPROPion (WELLBUTRIN XL) 300 MG 24 hr tablet Take 1 tablet (300 mg total) by mouth daily. 90 tablet 1  . COLCRYS 0.6 MG tablet Take 0.6 mg by mouth daily.  2  .  DULoxetine (CYMBALTA) 30 MG capsule Take 2 capsules (60 mg total) by mouth daily. 101 capsule 1  . folic acid (FOLVITE) 1 MG tablet     . Golimumab ([place] ARIA IV) Inject into the vein.    Marland Kitchen HYDROcodone-homatropine (HYCODAN) 5-1.5 MG/5ML syrup Take 5 mLs by mouth at bedtime as needed for cough. 120 mL 0  . ipratropium (ATROVENT) 0.06 % nasal spray Place 2 sprays into both nostrils 4 (four) times daily. 15 mL 1  . levothyroxine (SYNTHROID, LEVOTHROID) 75 MCG tablet TAKE 1 TABLET BY MOUTH EVERY DAY BEFORE BREAKFAST 90 tablet 1  . methotrexate (RHEUMATREX) 2.5  MG tablet Take 2.5 mg by mouth once a week. Take 9 tablets by mouth once a week    . omeprazole (PRILOSEC) 20 MG capsule Take 1 capsule (20 mg total) by mouth daily. 90 capsule 3         Objective:     BP 132/84 (BP Location: Left Arm, Patient Position: Sitting, Cuff Size: Normal)   Pulse (!) 118   Temp 98.3 F (36.8 C)   Ht 5\' 6"  (1.676 m)   Wt 191 lb 3.2 oz (86.7 kg)   SpO2 96%   BMI 30.86 kg/m   SpO2: 96 %  RA   Vital signs reviewed - Note on arrival 02 sats  96% on RA     HEENT: nl dentition, turbinates bilaterally, and oropharynx. Nl external ear canals without cough reflex   NECK :  without JVD/Nodes/TM/ nl carotid upstrokes bilaterally   LUNGS: no acc muscle use,  Nl contour chest which is clear to A and P bilaterally without cough on insp or exp maneuvers   CV:  RRR  no s3 or murmur or increase in P2, and no edema   ABD:  soft and nontender with nl inspiratory excursion in the supine position. No bruits or organomegaly appreciated, bowel sounds nl  MS:  Nl gait/ ext warm without deformities, calf tenderness, cyanosis or clubbing No obvious joint restrictions   SKIN: warm and dry without lesions    NEURO:  alert, approp, nl sensorium with  no motor or cerebellar deficits apparent.     I personally reviewed images and agree with radiology impression as follows:   Chest CT w/o contrast 10/25/18 No evidence of pneumonia or mediastinal adenopathy. 4 mm sub solid nodule in the right lower lobe. No follow-up needed if patient is low-risk.       Assessment   Upper airway cough syndrome Onset early Jan 2020 in setting of prob viral uri with neg Flu/ pertussis testing  - cyclical cough rx 03/31/1024   The most common causes of chronic cough in immunocompetent adults include the following: upper airway cough syndrome (UACS), previously referred to as postnasal drip syndrome (PNDS), which is caused by variety of rhinosinus conditions; (2) asthma; (3) GERD; (4)  chronic bronchitis from cigarette smoking or other inhaled environmental irritants; (5) nonasthmatic eosinophilic bronchitis; and (6) bronchiectasis.   These conditions, singly or in combination, have accounted for up to 94% of the causes of chronic cough in prospective studies.   Other conditions have constituted no >6% of the causes in prospective studies These have included bronchogenic carcinoma, chronic interstitial pneumonia, sarcoidosis, left ventricular failure, ACEI-induced cough, and aspiration from a condition associated with pharyngeal dysfunction.    Chronic cough is often simultaneously caused by more than one condition. A single cause has been found from 38 to 82% of the time, multiple causes from 18 to  62%. Multiply caused cough has been the result of three diseases up to 42% of the time.       Of the three most common causes of  Sub-acute / recurrent or chronic cough, only one (GERD)  can actually contribute to/ trigger  the other two (asthma and post nasal drip syndrome)  and perpetuate the cylce of cough.  While not intuitively obvious, many patients with chronic low grade reflux do not cough until there is a primary insult that disturbs the protective epithelial barrier and exposes sensitive nerve endings.   This is typically viral* but can due to PNDS and  either may apply here.   The point is that once this occurs, it is difficult to eliminate the cycle  using anything but a maximally effective acid suppression regimen at least in the short run, accompanied by an appropriate diet to address non acid GERD and control / eliminate the cough itself for at least 3 days with vicodin since can tol hycodan liquid fine but not strong enough to stop the cough.  Also added 6 days of prednisone in case there is any lingering upper/lower Th2 driven airways inflammation here  (vs that just caused by airway trauma from coughing)  *  ok to complete abx in case there is any residual sinusitis or  secondary infection related to what was in all likelihood a viral process and return here in 2 weeks to regroup, call sooner if needed        Solitary pulmonary nodule on lung CT Ct 10/25/2018  X 4 mm RLL subsolid  CT results reviewed with pt >>> Too small for PET or bx, not suspicious enough for excisional bx > really only option for now is follow the Fleischner society guidelines as rec by radiology = no directed f/u in this never smoker with h/o RA  Notable she does not have any evidence of RA lung dz or mtx toxicity radiographically so no need to change rx   Discussed in detail all the  indications, usual  risks and alternatives  relative to the benefits with patient who agrees to proceed with conservative f/u = no more ct's unless different clinical indication        Total time devoted to counseling  > 50 % of initial 60 min office visit:  review case with pt/ discussion of options/alternatives/ personally creating written customized instructions  in presence of pt  then going over those specific  Instructions directly with the pt including how to use all of the meds but in particular covering each new medication in detail and the difference between the maintenance= "automatic" meds and the prns using an action plan format for the latter (If this problem/symptom => do that organization reading Left to right).  Please see AVS from this visit for a full list of these instructions which I personally wrote for this pt and  are unique to this visit.      Christinia Gully, MD 10/30/2018

## 2018-10-30 NOTE — Assessment & Plan Note (Addendum)
Onset early Jan 2020 in setting of prob viral uri with neg Flu/ pertussis testing  - cyclical cough rx 1/0/9323   The most common causes of chronic cough in immunocompetent adults include the following: upper airway cough syndrome (UACS), previously referred to as postnasal drip syndrome (PNDS), which is caused by variety of rhinosinus conditions; (2) asthma; (3) GERD; (4) chronic bronchitis from cigarette smoking or other inhaled environmental irritants; (5) nonasthmatic eosinophilic bronchitis; and (6) bronchiectasis.   These conditions, singly or in combination, have accounted for up to 94% of the causes of chronic cough in prospective studies.   Other conditions have constituted no >6% of the causes in prospective studies These have included bronchogenic carcinoma, chronic interstitial pneumonia, sarcoidosis, left ventricular failure, ACEI-induced cough, and aspiration from a condition associated with pharyngeal dysfunction.    Chronic cough is often simultaneously caused by more than one condition. A single cause has been found from 38 to 82% of the time, multiple causes from 18 to 62%. Multiply caused cough has been the result of three diseases up to 42% of the time.       Of the three most common causes of  Sub-acute / recurrent or chronic cough, only one (GERD)  can actually contribute to/ trigger  the other two (asthma and post nasal drip syndrome)  and perpetuate the cylce of cough.  While not intuitively obvious, many patients with chronic low grade reflux do not cough until there is a primary insult that disturbs the protective epithelial barrier and exposes sensitive nerve endings.   This is typically viral* but can due to PNDS and  either may apply here.   The point is that once this occurs, it is difficult to eliminate the cycle  using anything but a maximally effective acid suppression regimen at least in the short run, accompanied by an appropriate diet to address non acid GERD and  control / eliminate the cough itself for at least 3 days with vicodin since can tol hycodan liquid fine but not strong enough to stop the cough.  Also added 6 days of prednisone in case there is any lingering upper/lower Th2 driven airways inflammation here  (vs that just caused by airway trauma from coughing)  *  ok to complete abx in case there is any residual sinusitis or secondary infection related to what was in all likelihood a viral process and return here in 2 weeks to regroup, call sooner if needed

## 2018-10-30 NOTE — Assessment & Plan Note (Signed)
Ct 10/25/2018  X 4 mm RLL subsolid  CT results reviewed with pt >>> Too small for PET or bx, not suspicious enough for excisional bx > really only option for now is follow the Fleischner society guidelines as rec by radiology = no directed f/u in this never smoker with h/o RA  Notable she does not have any evidence of RA lung dz or mtx toxicity radiographically so no need to change rx   Discussed in detail all the  indications, usual  risks and alternatives  relative to the benefits with patient who agrees to proceed with conservative f/u = no more ct's unless different clinical indication    Total time devoted to counseling  > 50 % of initial 60 min office visit:  review case with pt/ discussion of options/alternatives/ personally creating written customized instructions  in presence of pt  then going over those specific  Instructions directly with the pt including how to use all of the meds but in particular covering each new medication in detail and the difference between the maintenance= "automatic" meds and the prns using an action plan format for the latter (If this problem/symptom => do that organization reading Left to right).  Please see AVS from this visit for a full list of these instructions which I personally wrote for this pt and  are unique to this visit.

## 2018-10-30 NOTE — Patient Instructions (Signed)
The key to effective treatment for your cough is eliminating the non-stop cycle of cough you're stuck in long enough to let your airway heal completely and then see if there is anything still making you cough once you stop the cough suppression, but this should take no more than 5 days to figure out.  First take delsym two tsp every 12 hours and supplement if needed with  Vicodin  up to 1-2 every 4 hours to suppress the urge to cough at all or even clear your throat. Swallowing water or using ice chips/non mint and menthol containing candies (such as lifesavers or sugarless jolly ranchers) are also effective.  You should rest your voice and avoid activities that you know make you cough.  Once you have eliminated the cough for 3 straight days try reducing the vicodin first,  then the delsym as tolerated.    Prednisone 10 mg take  4 each am x 2 days,   2 each am x 2 days,  1 each am x 2 days and stop (this is to eliminate allergies and inflammation from coughing)  Protonix (pantoprazole) Take 30-60 min before first meal of the day and prilosec(omeprazole) 20 mg Take 30-60 min before last  meal of the day   GERD (REFLUX)  is an extremely common cause of respiratory symptoms, many times with no significant heartburn at all.    It can be treated with medication, but also with lifestyle changes including avoidance of late meals, excessive alcohol, smoking cessation, and avoid fatty foods, chocolate, peppermint, colas, red wine, and acidic juices such as orange juice.  NO MINT OR MENTHOL PRODUCTS SO NO COUGH DROPS   USE HARD CANDY INSTEAD (jolley ranchers or Stover's or Lifesavers (all available in sugarless versions) NO OIL BASED VITAMINS - use powdered substitutes.  Return to work 11/06/2018  Please schedule a follow up office visit in 2  weeks, sooner if needed

## 2018-10-31 ENCOUNTER — Telehealth: Payer: Self-pay | Admitting: Internal Medicine

## 2018-10-31 NOTE — Telephone Encounter (Signed)
That's the epic default to use the 5/300 so yes fine to use the 5/325 same rx and number

## 2018-10-31 NOTE — Telephone Encounter (Signed)
Spoke to [place] at [facility].  They have hydrocodone 5/325mg  and not hydrocodone 5/300mg .  What would you like to do Dr. Melvyn Novas?

## 2018-10-31 NOTE — Telephone Encounter (Signed)
Spoke with pharmacist at [facility].  She staed that there needs to be a whole new script sent in for the hydrocodone 5/325mg .  She deleted the other prescription for the 5/300.  Dr. Melvyn Novas, could you please send in a new script?

## 2018-11-01 ENCOUNTER — Telehealth: Payer: Self-pay | Admitting: Internal Medicine

## 2018-11-01 DIAGNOSIS — M1120 Other chondrocalcinosis, unspecified site: Secondary | ICD-10-CM | POA: Diagnosis not present

## 2018-11-01 DIAGNOSIS — M0589 Other rheumatoid arthritis with rheumatoid factor of multiple sites: Secondary | ICD-10-CM | POA: Diagnosis not present

## 2018-11-01 DIAGNOSIS — Z79899 Other long term (current) drug therapy: Secondary | ICD-10-CM | POA: Diagnosis not present

## 2018-11-01 DIAGNOSIS — M2550 Pain in unspecified joint: Secondary | ICD-10-CM | POA: Diagnosis not present

## 2018-11-01 MED ORDER — HYDROCODONE-ACETAMINOPHEN 5-300 MG PO TABS: ORAL_TABLET | ORAL | 0 refills | Status: DC

## 2018-11-01 MED ORDER — HYDROCODONE-ACETAMINOPHEN 5-325 MG PO TABS: 1.0000 | ORAL_TABLET | Freq: Four times a day (QID) | ORAL | 0 refills | Status: DC | PRN

## 2018-11-01 NOTE — Telephone Encounter (Signed)
Call made to [facility]. She states the exact same order for the 5/300 was sent over. They need Hydrocodone/Acetaminophen 5/325mg  of Vicodin.   MW, the order has been pended down in the orders of this message. This is what they need. Please advise. Thanks.

## 2018-11-01 NOTE — Telephone Encounter (Signed)
Noted. Called patient to make her aware. Nothing further needed.

## 2018-11-01 NOTE — Telephone Encounter (Signed)
done

## 2018-11-01 NOTE — Telephone Encounter (Signed)
Call made to pharmacy to ensure they received script. Per pharmacy tech [place] it is ready for pick up. Call made to patient to make aware. Nothing further is needed at this time.

## 2018-11-13 ENCOUNTER — Ambulatory Visit: Payer: BLUE CROSS/BLUE SHIELD | Admitting: Internal Medicine

## 2018-11-30 DIAGNOSIS — Z79899 Other long term (current) drug therapy: Secondary | ICD-10-CM | POA: Diagnosis not present

## 2018-11-30 DIAGNOSIS — M0589 Other rheumatoid arthritis with rheumatoid factor of multiple sites: Secondary | ICD-10-CM | POA: Diagnosis not present

## 2018-12-05 IMAGING — DX DG KNEE COMPLETE 4+V*L*
4 series · 4 of 4 positions shown · non-contrast
Comparison: 03/31/2009.

CLINICAL DATA: 47-year-old female with left knee pain and swelling
for several weeks. No injury. Chronic arthritis stat flares up.
Initial encounter.

EXAM:
LEFT KNEE - COMPLETE 4+ VIEW

[knee ap]
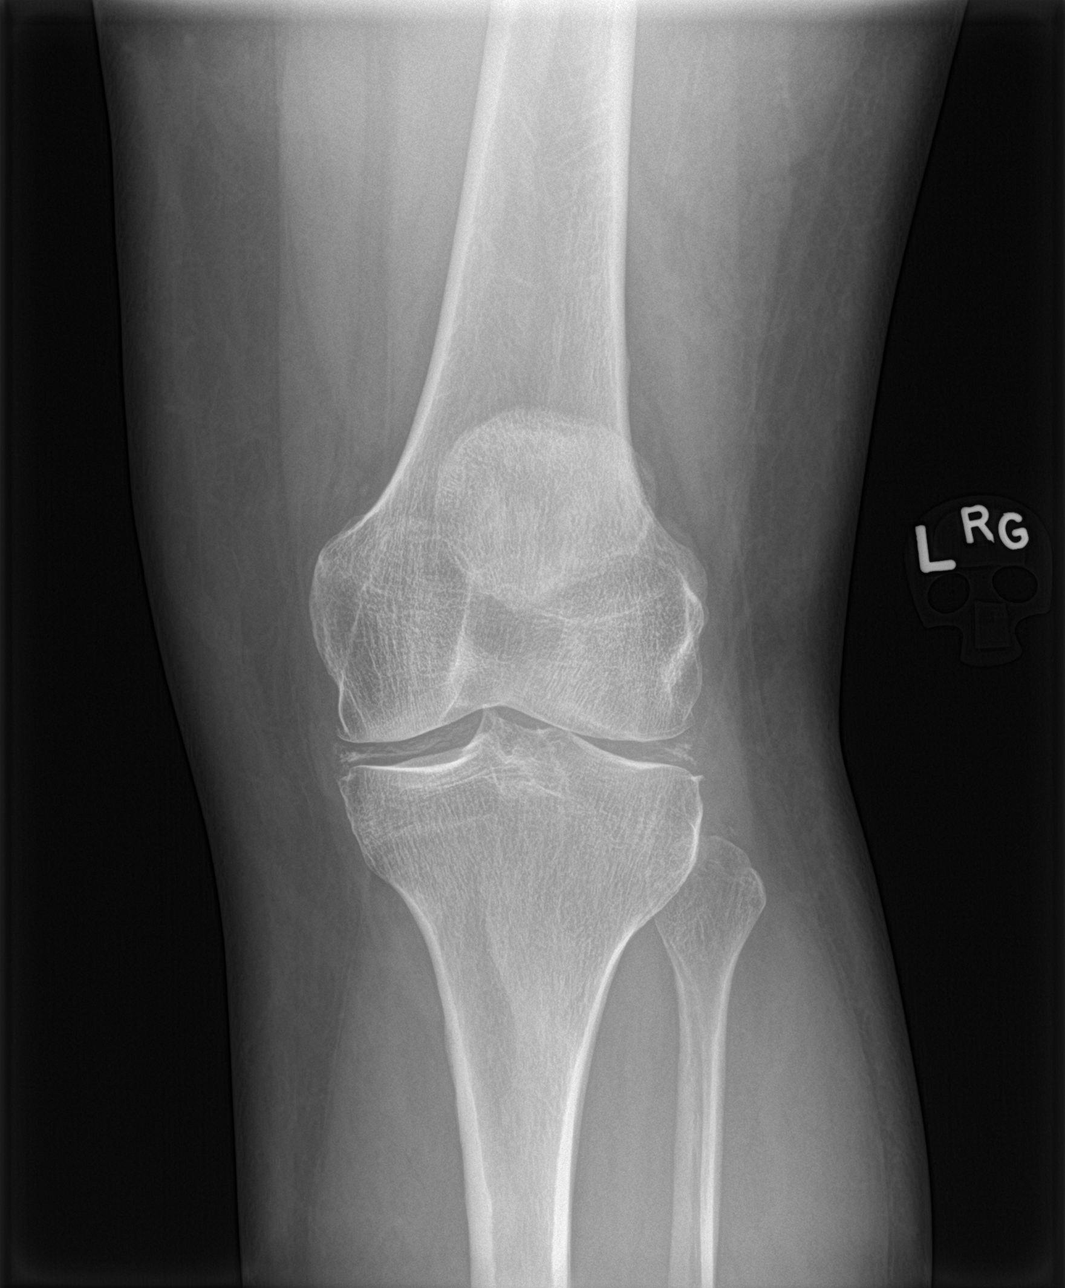

[tunnel]
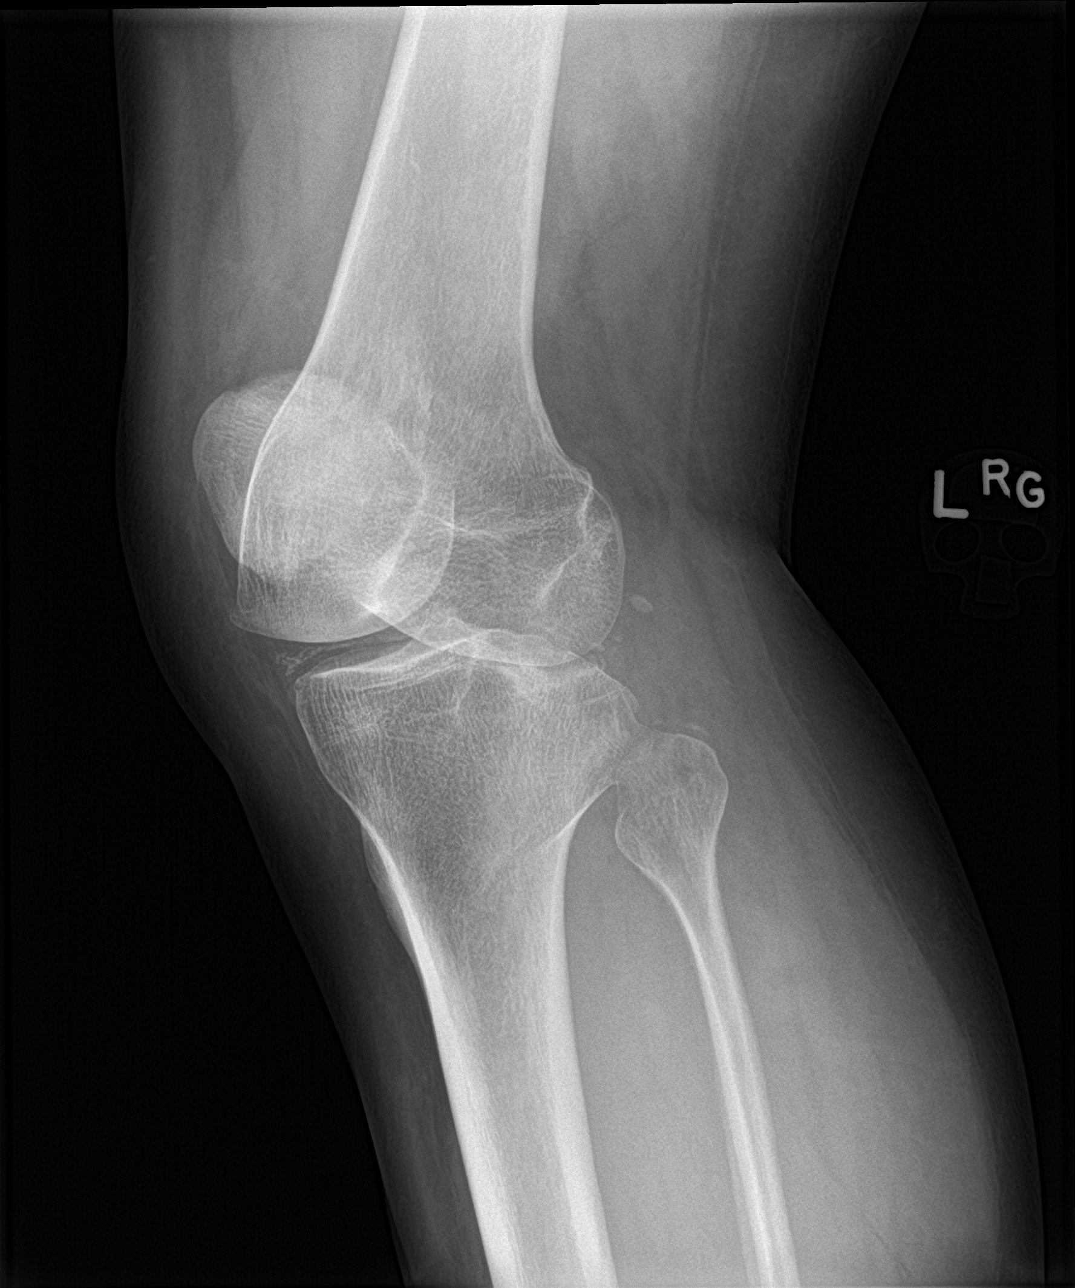

[knee lat]
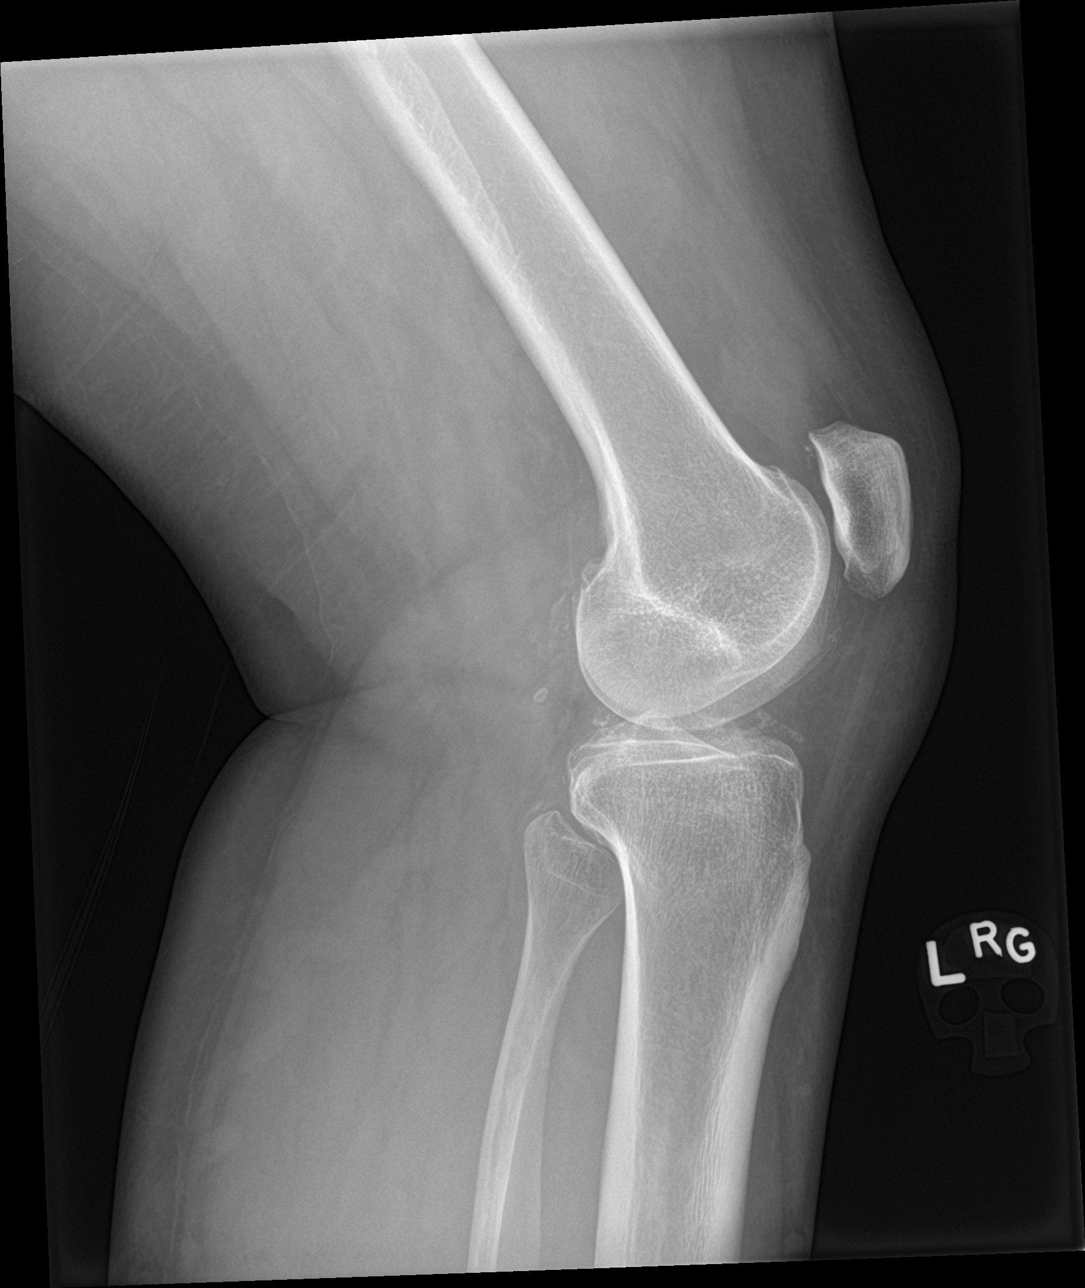

[knee obl]
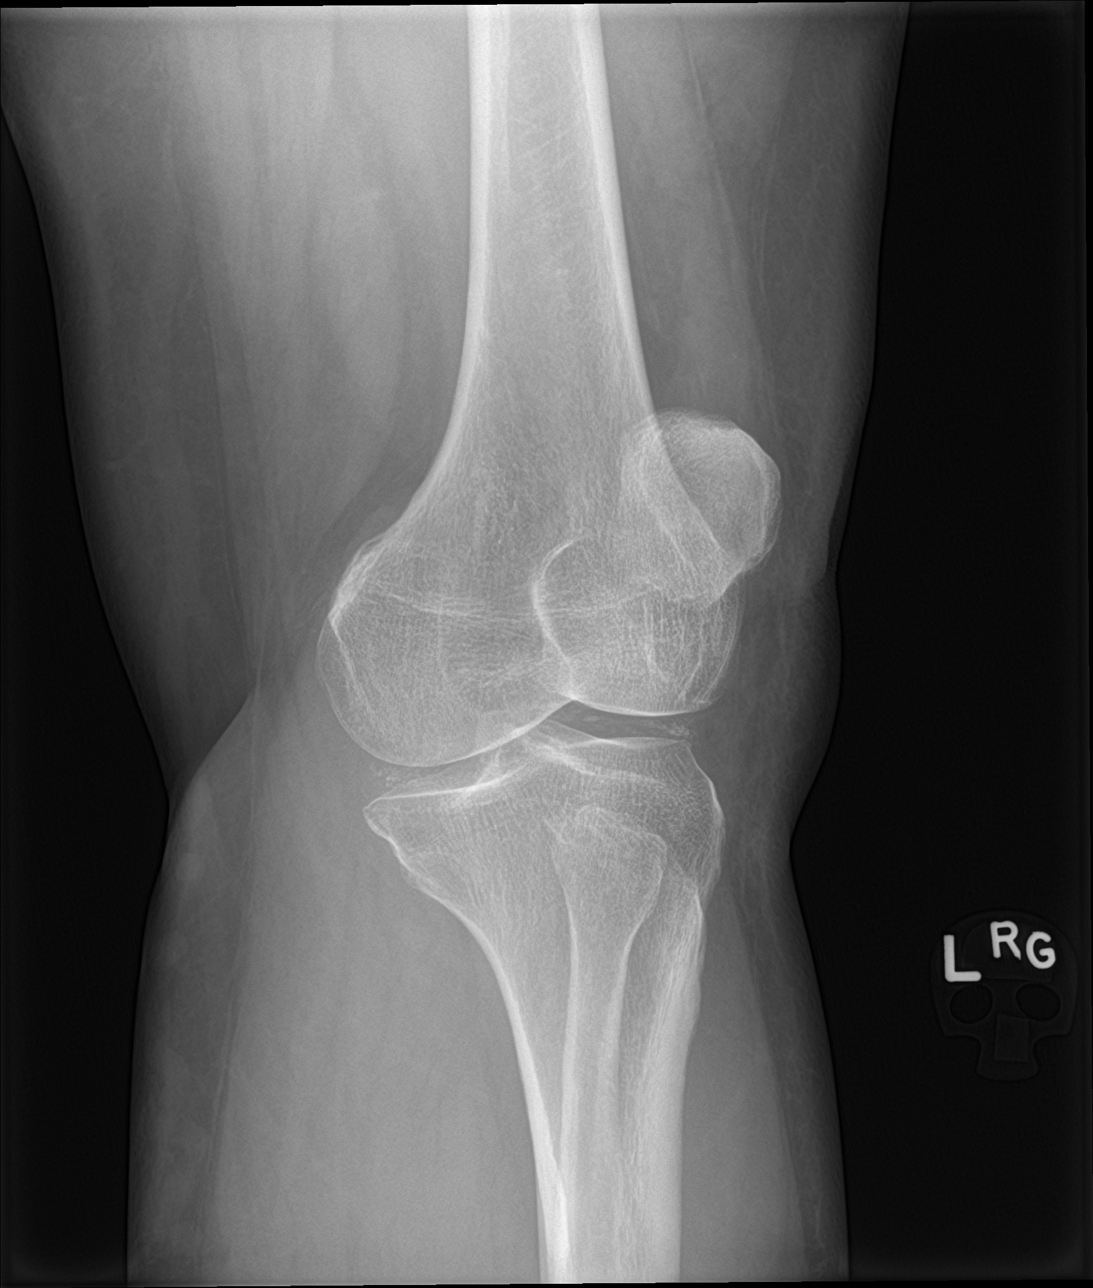

[4 of 4 positions shown; findings below may reference images not displayed]

FINDINGS: Dense menisci consistent with changes of chondrocalcinosis. Minimal
patellofemoral joint degenerative changes. Small suprapatellar joint
effusion. No fracture or dislocation.
IMPRESSION: 1. Chondrocalcinosis.
2. Minimal patellofemoral joint degenerative changes.
3. Small suprapatellar joint effusion.

## 2018-12-15 ENCOUNTER — Other Ambulatory Visit: Payer: Self-pay | Admitting: Family Medicine

## 2018-12-15 DIAGNOSIS — F321 Major depressive disorder, single episode, moderate: Secondary | ICD-10-CM

## 2018-12-24 ENCOUNTER — Other Ambulatory Visit: Payer: Self-pay | Admitting: Family Medicine

## 2018-12-24 DIAGNOSIS — E039 Hypothyroidism, unspecified: Secondary | ICD-10-CM

## 2019-01-25 DIAGNOSIS — M0589 Other rheumatoid arthritis with rheumatoid factor of multiple sites: Secondary | ICD-10-CM | POA: Diagnosis not present

## 2019-02-05 ENCOUNTER — Encounter: Payer: Self-pay | Admitting: Physician Assistant

## 2019-02-05 ENCOUNTER — Other Ambulatory Visit: Payer: Self-pay

## 2019-02-05 ENCOUNTER — Ambulatory Visit (INDEPENDENT_AMBULATORY_CARE_PROVIDER_SITE_OTHER): Payer: BLUE CROSS/BLUE SHIELD | Admitting: Physician Assistant

## 2019-02-05 ENCOUNTER — Ambulatory Visit (INDEPENDENT_AMBULATORY_CARE_PROVIDER_SITE_OTHER): Payer: BLUE CROSS/BLUE SHIELD

## 2019-02-05 VITALS — BP 138/76 | HR 97 | Temp 98.7°F | Ht 65.0 in | Wt 205.0 lb

## 2019-02-05 DIAGNOSIS — D899 Disorder involving the immune mechanism, unspecified: Secondary | ICD-10-CM

## 2019-02-05 DIAGNOSIS — Q892 Congenital malformations of other endocrine glands: Secondary | ICD-10-CM

## 2019-02-05 DIAGNOSIS — R591 Generalized enlarged lymph nodes: Secondary | ICD-10-CM

## 2019-02-05 DIAGNOSIS — Z20822 Contact with and (suspected) exposure to covid-19: Secondary | ICD-10-CM

## 2019-02-05 DIAGNOSIS — D849 Immunodeficiency, unspecified: Secondary | ICD-10-CM | POA: Insufficient documentation

## 2019-02-05 DIAGNOSIS — R221 Localized swelling, mass and lump, neck: Secondary | ICD-10-CM | POA: Diagnosis not present

## 2019-02-05 DIAGNOSIS — R6889 Other general symptoms and signs: Secondary | ICD-10-CM

## 2019-02-05 DIAGNOSIS — R1310 Dysphagia, unspecified: Secondary | ICD-10-CM

## 2019-02-05 LAB — CBC WITH DIFFERENTIAL/PLATELET
Absolute Monocytes: 615 cells/uL (ref 200–950)
Basophils Absolute: 64 cells/uL (ref 0–200)
Basophils Relative: 0.6 %
Eosinophils Absolute: 201 cells/uL (ref 15–500)
Eosinophils Relative: 1.9 %
HCT: 42.2 % (ref 35.0–45.0)
Hemoglobin: 14.7 g/dL (ref 11.7–15.5)
Lymphs Abs: 2459 cells/uL (ref 850–3900)
MCH: 33 pg (ref 27.0–33.0)
MCHC: 34.8 g/dL (ref 32.0–36.0)
MCV: 94.6 fL (ref 80.0–100.0)
MPV: 10.7 fL (ref 7.5–12.5)
Monocytes Relative: 5.8 %
Neutro Abs: 7261 cells/uL (ref 1500–7800)
Neutrophils Relative %: 68.5 %
Platelets: 300 10*3/uL (ref 140–400)
RBC: 4.46 10*6/uL (ref 3.80–5.10)
RDW: 11.9 % (ref 11.0–15.0)
Total Lymphocyte: 23.2 %
WBC: 10.6 10*3/uL (ref 3.8–10.8)

## 2019-02-05 LAB — SAR COV2 SEROLOGY (COVID19)AB(IGG),IA: SARS CoV2 AB IGG: NEGATIVE

## 2019-02-05 MED ORDER — IBUPROFEN 800 MG PO TABS: 800.0000 mg | ORAL_TABLET | Freq: Three times a day (TID) | ORAL | 0 refills | Status: DC | PRN

## 2019-02-05 NOTE — Progress Notes (Signed)
Negative for antibodies to COVID.

## 2019-02-05 NOTE — Progress Notes (Signed)
Cbc all in normal range. WBC normal but upper limits of normal.

## 2019-02-05 NOTE — Progress Notes (Addendum)
Subjective:    Patient ID: Nancy Wang, female    DOB: 23-Sep-1971, 48 y.o.   MRN: [id number]  HPI  Pt is a 48 yo female with hypothyroidism, RA and on biologics who presents to the clinic with sudden swelling under chin and some difficultly swallowing since yesterday. She denies any ST, sinus pressure, teeth pain, ear pain, headache, fever, chills. She does have night sweats but that has been ongoing since she started menopause. She just feels "pressure" in her neck area. She has done nothing to make better or worse. She denies any recent med changes. Last Simponi infusion was a week from Thursday.   She does mention how sick she was in January of this year. She had a cough for over a month and SOB and fever. She would like to know if she has antibodies for COVID.   Marland Kitchen. Active Ambulatory Problems    Diagnosis Date Noted  . Hypothyroidism 03/31/2009  . PSEUDOGOUT 05/26/2009  . Depression 06/26/2010  . RHEUMATOID ARTHRITIS 05/26/2009  . KNEE PAIN, LEFT 03/31/2009  . IRON, SERUM, ELEVATED 05/26/2009  . ELEVATED BLOOD PRESSURE 05/26/2009  . HYPOXEMIA 09/02/2010  . ALLERGIC RHINITIS 10/08/2010  . UNSPECIFIED VITAMIN D DEFICIENCY 12/10/2010  . QT prolongation 08/02/2013  . Insomnia 06/07/2014  . Hyperlipidemia 03/05/2015  . 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 Upper airway cough syndrome 10/30/2018  . Solitary pulmonary nodule on lung CT 10/30/2018  . Lymphadenopathy 02/05/2019  . Immunocompromised patient ([place]) 02/05/2019  . Localized swelling, mass or lump of neck 02/05/2019   Resolved Ambulatory Problems    Diagnosis Date Noted  . VIRAL URI 08/25/2010  . PNEUMONIA, ATYPICAL 09/02/2010  . Wheezing 09/02/2010  . Cough 09/02/2010  . Atypical pneumonia 05/07/2011  . Acute bronchitis 04/30/2014   Past Medical History:  Diagnosis Date  . Bronchitis   . GERD (gastroesophageal reflux disease)   . Pseudogout   . RA (rheumatoid arthritis) ([place])   . Thyroid  disease       Review of Systems See HPI.     Objective:   Physical Exam Vitals signs reviewed.  Constitutional:      Appearance: She is well-developed.  HENT:     Head: Normocephalic.     Comments: No sinus tenderness to palpation.     Right Ear: Tympanic membrane normal. No drainage, swelling or tenderness. No middle ear effusion. Tympanic membrane is not erythematous.     Left Ear: Tympanic membrane normal. No drainage, swelling or tenderness.  No middle ear effusion. Tympanic membrane is not erythematous.     Nose: No congestion.     Mouth/Throat:     Mouth: Mucous membranes are moist.     Pharynx: Oropharynx is clear. Uvula midline. No oropharyngeal exudate or uvula swelling.     Tonsils: No tonsillar exudate or tonsillar abscesses. 0 on the right. 0 on the left.  Eyes:     Conjunctiva/sclera: Conjunctivae normal.  Neck:     Comments: Large firm mobile mass under shin in the submental/submanibular area approximately 2cm. Slightly tender to touch. Not red or warm to touch.  Cardiovascular:     Rate and Rhythm: Normal rate and regular rhythm.  Neurological:     General: No focal deficit present.     Mental Status: She is alert and oriented to person, place, and time.  Psychiatric:        Mood and Affect: Mood normal.           Assessment &  Plan:  ..Nancy Wang was seen today for sore throat.  Diagnoses and all orders for this visit:  Lymphadenopathy -     CBC with Differential/Platelet -     ibuprofen (ADVIL) 800 MG tablet; Take 1 tablet (800 mg total) by mouth every 8 (eight) hours as needed. -     US SOFT TISSUE HEAD & NECK (NON-THYROID)  Suspected Covid-19 Virus Infection -     SAR CoV2 Serology (COVID 19)AB(IGG)IA  Localized swelling, mass or lump of neck -     CT Soft Tissue Neck W Contrast  Immunocompromised patient [facility])   Unclear etiology of why her submental/submandibular lymph  node could have enlarged so quickly. Concerned due to her immune status.  Will get u/s today. Use NsAIDs as needed and cool compresses. Will get CBC. Will look for covid antibodies due to January suspected symptoms.   Marland Kitchen.Spent 30 minutes with patient and greater than 50 percent of visit spent counseling patient regarding treatment plan.   U/S showed 1.5cm cystic or necrotic lymphnode. Suggested CT will get stat CT.   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 Pt was called but having increasing problems swallowing and now reflux. She feels a lot of pressure. Urgent referral to ENT. Discussed problems breathing to follow up in office or ED.

## 2019-02-05 NOTE — Progress Notes (Signed)
1.5cm cystic area but radiologist wants more imaging to see if could be necrotic(dead tissue). Will order CT of neck.

## 2019-02-06 ENCOUNTER — Ambulatory Visit (INDEPENDENT_AMBULATORY_CARE_PROVIDER_SITE_OTHER): Payer: BLUE CROSS/BLUE SHIELD

## 2019-02-06 DIAGNOSIS — R221 Localized swelling, mass and lump, neck: Secondary | ICD-10-CM

## 2019-02-06 DIAGNOSIS — J029 Acute pharyngitis, unspecified: Secondary | ICD-10-CM | POA: Diagnosis not present

## 2019-02-06 MED ORDER — IOHEXOL 300 MG/ML  SOLN
100.0000 mL | Freq: Once | INTRAMUSCULAR | Status: AC | PRN
  Administered 2019-02-06: 09:00:00 75 mL via INTRAVENOUS

## 2019-02-06 NOTE — Progress Notes (Signed)
Ok I am making urgent referral to ENT. Also if you start to have problems swallowing or breathing. Don't hesitated to come if for recheck. Can use omeprazole 40mg  a day and pepcid 20mg  bid. Unclear why reflux would act up right now.

## 2019-02-06 NOTE — Progress Notes (Signed)
Attempted to call with results myself. Pt did not answer.  Please retry. I can also speak to her if needed.  CT seems to show inflammatory lymph nodes(good news) and thyroglossal duct cyst. Due to size and sudden nature I think you need a ENT consult. Are you ok with this?

## 2019-02-06 NOTE — Addendum Note (Signed)
Addended by: Donella Stade on: 02/06/2019 03:04 PM   Modules accepted: Orders

## 2019-02-07 DIAGNOSIS — Q892 Congenital malformations of other endocrine glands: Secondary | ICD-10-CM | POA: Diagnosis not present

## 2019-02-13 ENCOUNTER — Ambulatory Visit: Payer: Managed Care, Other (non HMO) | Admitting: Family Medicine

## 2019-02-14 ENCOUNTER — Ambulatory Visit: Payer: BLUE CROSS/BLUE SHIELD | Admitting: Family Medicine

## 2019-02-14 NOTE — Progress Notes (Deleted)
Established Patient Office Visit  Subjective:  Patient ID: Nancy Wang, female    DOB: 12-19-1970  Age: 48 y.o. MRN: [id number]  CC: No chief complaint on file.   HPI Contina Strain presents for   Hypothyroidism - Taking medication regularly in the AM away from food and vitamins, etc. No recent change to skin, hair, or energy levels.   Past Medical History:  Diagnosis Date  . Bronchitis   . GERD (gastroesophageal reflux disease)   . Pseudogout    Dr. Ouida Sills  . RA (rheumatoid arthritis) ([place])    Follow by Tobie Lords, MD - [facility]  . Thyroid disease    hypothyroidism    Past Surgical History:  Procedure Laterality Date  . ABDOMINAL SURGERY    . arthroscopic surgery     both knees, RT 1985 removed patella, LT 1988  . ESSURE TUBAL LIGATION    . exploratory lap  1985, 2004   for ovarian cysts, infertility    Family History  Problem Relation Age of Onset  . Cancer Father        prostate  . Coronary artery disease Father   . Depression Father   . Hypertension Mother   . Diabetes Mother   . Diabetes Unknown        family history  . Hypertension Unknown        family history    Social History   Socioeconomic History  . Marital status: Married    Spouse name: Not on file  . Number of children: Not on file  . Years of education: Not on file  . Highest education level: Not on file  Occupational History  . Not on file  Social Needs  . Financial resource strain: Not on file  . Food insecurity:    Worry: Not on file    Inability: Not on file  . Transportation needs:    Medical: Not on file    Non-medical: Not on file  Tobacco Use  . Smoking status: Never Smoker  . Smokeless tobacco: Never Used  Substance and Sexual Activity  . Alcohol use: Yes  . Drug use: No  . Sexual activity: Not on file    Comment: married, no regular exercise, limited by knee pain, has son.   Lifestyle  . Physical activity:    Days per week:  Not on file    Minutes per session: Not on file  . Stress: Not on file  Relationships  . Social connections:    Talks on phone: Not on file    Gets together: Not on file    Attends religious service: Not on file    Active member of club or organization: Not on file    Attends meetings of clubs or organizations: Not on file    Relationship status: Not on file  . Intimate partner violence:    Fear of current or ex partner: Not on file    Emotionally abused: Not on file    Physically abused: Not on file    Forced sexual activity: Not on file  Other Topics Concern  . Not on file  Social History Narrative  . Not on file    Outpatient Medications Prior to Visit  Medication Sig Dispense Refill  . albuterol (PROVENTIL HFA;VENTOLIN HFA) 108 (90 Base) MCG/ACT inhaler Inhale 1-2 puffs into the lungs every 6 hours as needed for bronchospasm  18 Inhaler 3  . buPROPion (WELLBUTRIN XL) 300 MG 24 hr tablet TAKE 1  TABLET DAILY 90 tablet 3  . COLCRYS 0.6 MG tablet Take 0.6 mg by mouth daily.  2  . DULoxetine (CYMBALTA) 30 MG capsule Take 2 capsules (60 mg total) by mouth daily. 268 capsule 1  . folic acid (FOLVITE) 1 MG tablet     . Golimumab ([place] ARIA IV) Inject into the vein.    Marland Kitchen HYDROcodone-acetaminophen (NORCO/VICODIN) 5-325 MG tablet Take 1 tablet by mouth every 6 (six) hours as needed for moderate pain. 30 tablet 0  . ibuprofen (ADVIL) 800 MG tablet Take 1 tablet (800 mg total) by mouth every 8 (eight) hours as needed. 60 tablet 0  . levothyroxine (SYNTHROID, LEVOTHROID) 75 MCG tablet TAKE 1 TABLET DAILY BEFORE BREAKFAST 90 tablet 1  . methotrexate (RHEUMATREX) 2.5 MG tablet Take 2.5 mg by mouth once a week. Take 9 tablets by mouth once a week    . omeprazole (PRILOSEC) 20 MG capsule Take 1 capsule (20 mg total) by mouth daily. 90 capsule 3   No facility-administered medications prior to visit.     Allergies  Allergen Reactions  . Citalopram Other (See Comments)    QT prolongation   . Morphine Sulfate     ROS Review of Systems    Objective:    Physical Exam  There were no vitals taken for this visit. Wt Readings from Last 3 Encounters:  02/05/19 205 lb (93 kg)  10/30/18 191 lb 3.2 oz (86.7 kg)  10/17/18 200 lb (90.7 kg)     Health Maintenance Due  Topic Date Due  . HIV Screening  12/13/1985  . PAP SMEAR-Modifier  12/25/2018    There are no preventive care reminders to display for this patient.  Lab Results  Component Value Date   TSH 4.38 05/16/2018   Lab Results  Component Value Date   WBC 10.6 02/05/2019   HGB 14.7 02/05/2019   HCT 42.2 02/05/2019   MCV 94.6 02/05/2019   PLT 300 02/05/2019   Lab Results  Component Value Date   NA 137 05/16/2018   K 3.9 05/16/2018   CO2 27 05/16/2018   GLUCOSE 112 (H) 05/16/2018   BUN 16 05/16/2018   CREATININE 0.93 05/16/2018   BILITOT 1.0 05/16/2018   ALKPHOS 74 12/24/2013   AST 16 05/16/2018   ALT 17 05/16/2018   PROT 6.7 05/16/2018   ALBUMIN 3.9 12/24/2013   CALCIUM 9.3 05/16/2018   Lab Results  Component Value Date   CHOL 170 12/24/2013   Lab Results  Component Value Date   HDL 42 12/24/2013   Lab Results  Component Value Date   LDLCALC 105 (H) 12/24/2013   Lab Results  Component Value Date   TRIG 113 12/24/2013   Lab Results  Component Value Date   CHOLHDL 4.0 12/24/2013   Lab Results  Component Value Date   HGBA1C 5.4 05/16/2018      Assessment & Plan:   Problem List Items Addressed This Visit      Endocrine   Hypothyroidism - Primary      No orders of the defined types were placed in this encounter.   Follow-up: No follow-ups on file.    Beatrice Lecher, MD

## 2019-03-05 DIAGNOSIS — M1120 Other chondrocalcinosis, unspecified site: Secondary | ICD-10-CM | POA: Diagnosis not present

## 2019-03-05 DIAGNOSIS — M0589 Other rheumatoid arthritis with rheumatoid factor of multiple sites: Secondary | ICD-10-CM | POA: Diagnosis not present

## 2019-03-05 DIAGNOSIS — M2550 Pain in unspecified joint: Secondary | ICD-10-CM | POA: Diagnosis not present

## 2019-03-22 DIAGNOSIS — M0589 Other rheumatoid arthritis with rheumatoid factor of multiple sites: Secondary | ICD-10-CM | POA: Diagnosis not present

## 2019-05-23 DIAGNOSIS — M0589 Other rheumatoid arthritis with rheumatoid factor of multiple sites: Secondary | ICD-10-CM | POA: Diagnosis not present

## 2019-06-22 ENCOUNTER — Other Ambulatory Visit: Payer: Self-pay | Admitting: Family Medicine

## 2019-06-22 DIAGNOSIS — E039 Hypothyroidism, unspecified: Secondary | ICD-10-CM

## 2019-07-10 DIAGNOSIS — M1120 Other chondrocalcinosis, unspecified site: Secondary | ICD-10-CM | POA: Diagnosis not present

## 2019-07-10 DIAGNOSIS — M2550 Pain in unspecified joint: Secondary | ICD-10-CM | POA: Diagnosis not present

## 2019-07-10 DIAGNOSIS — Z79899 Other long term (current) drug therapy: Secondary | ICD-10-CM | POA: Diagnosis not present

## 2019-07-10 DIAGNOSIS — M0589 Other rheumatoid arthritis with rheumatoid factor of multiple sites: Secondary | ICD-10-CM | POA: Diagnosis not present

## 2019-07-10 LAB — LIPID PANEL
Cholesterol: 202 — AB (ref 0–200)
HDL: 54 (ref 35–70)
LDL Cholesterol: 109
Triglycerides: 224 — AB (ref 40–160)

## 2019-07-10 LAB — BASIC METABOLIC PANEL
BUN: 14 (ref 4–21)
Chloride: 101 (ref 99–108)
Creatinine: 0.9 (ref 0.5–1.1)

## 2019-07-10 LAB — HEPATIC FUNCTION PANEL
ALT: 66 — AB (ref 7–35)
AST: 31 (ref 13–35)
Alkaline Phosphatase: 75 (ref 25–125)

## 2019-07-10 LAB — CBC AND DIFFERENTIAL
Hemoglobin: 14.9 (ref 12.0–16.0)
Platelets: 269 (ref 150–399)
WBC: 9.5

## 2019-07-10 LAB — COMPREHENSIVE METABOLIC PANEL
Calcium: 9.7 (ref 8.7–10.7)
GFR calc non Af Amer: 81

## 2019-07-12 ENCOUNTER — Other Ambulatory Visit: Payer: Self-pay | Admitting: Family Medicine

## 2019-07-12 DIAGNOSIS — K219 Gastro-esophageal reflux disease without esophagitis: Secondary | ICD-10-CM

## 2019-07-12 NOTE — Telephone Encounter (Signed)
Please call patient, she is past due for appt with Dr Madilyn Fireman   Please get her scheduled for an appt

## 2019-07-17 ENCOUNTER — Ambulatory Visit: Payer: BLUE CROSS/BLUE SHIELD | Admitting: Family Medicine

## 2019-07-19 ENCOUNTER — Encounter: Payer: Self-pay | Admitting: Family Medicine

## 2019-07-19 ENCOUNTER — Ambulatory Visit (INDEPENDENT_AMBULATORY_CARE_PROVIDER_SITE_OTHER): Payer: BC Managed Care – PPO | Admitting: Family Medicine

## 2019-07-19 ENCOUNTER — Telehealth: Payer: Self-pay | Admitting: Family Medicine

## 2019-07-19 VITALS — BP 121/82 | HR 104 | Temp 98.2°F | Ht 65.0 in | Wt 206.0 lb

## 2019-07-19 DIAGNOSIS — F321 Major depressive disorder, single episode, moderate: Secondary | ICD-10-CM

## 2019-07-19 DIAGNOSIS — M7712 Lateral epicondylitis, left elbow: Secondary | ICD-10-CM

## 2019-07-19 DIAGNOSIS — M069 Rheumatoid arthritis, unspecified: Secondary | ICD-10-CM

## 2019-07-19 DIAGNOSIS — E785 Hyperlipidemia, unspecified: Secondary | ICD-10-CM | POA: Diagnosis not present

## 2019-07-19 DIAGNOSIS — E039 Hypothyroidism, unspecified: Secondary | ICD-10-CM

## 2019-07-19 DIAGNOSIS — J22 Unspecified acute lower respiratory infection: Secondary | ICD-10-CM

## 2019-07-19 DIAGNOSIS — Z23 Encounter for immunization: Secondary | ICD-10-CM | POA: Diagnosis not present

## 2019-07-19 MED ORDER — DULOXETINE HCL 30 MG PO CPEP: 60.0000 mg | ORAL_CAPSULE | Freq: Every day | ORAL | 1 refills | Status: DC

## 2019-07-19 MED ORDER — ALBUTEROL SULFATE HFA 108 (90 BASE) MCG/ACT IN AERS: INHALATION_SPRAY | RESPIRATORY_TRACT | 1 refills | Status: DC

## 2019-07-19 NOTE — Progress Notes (Signed)
Established Patient Office Visit  Subjective:  Patient ID: Nancy Wang, female    DOB: 20-Nov-1970  Age: 48 y.o. MRN: [id number]  CC: No chief complaint on file.   HPI Nancy Wang presents for   Hypothyroidism - Taking medication regularly in the AM away from food and vitamins, etc. No recent change to skin, hair, or energy levels. She has been taking an extra half a tab a week for the last year.  She was put to come back and have it rechecked but did not.  F/U depression -she is currently on Cymbalta 30 mg.  She is also taking Wellbutrin.  She and her husband are doing some marital counseling.  She says this week was particularly stressful because her husband ended up having to have his gallbladder removed acutely.  She is happy with her current regimen and wants to leave it as it is.  Due to recheck lipids. Not currently on any type of treatment.  Had recent lipid levels with Dr. Gavin Pound, rheumatology.  Rheumatoid arthritis-followed by rheumatology.  They are actually planning on taking her off of Simponi and putting her on REnovig.  In part because she just felt like the medication was wearing off early and she was feeling much more fatigued.  She is actually been on Simponi for probably about 4 years.  She also recently had some labs including a lipid panel with rheumatology and was told that her ALT was a little bit elevated at 66 so they are following that.  She also complains of left lateral elbow pain.  She says it feels like tennis elbow which she is had before she is cannot member which elbow she had it in.  But for the last several weeks she has been getting pain on the outside of that elbow she notices sometimes it will radiate up towards her shoulder sometimes down towards her wrist.  Occasionally she will even get some tingling in her middle finger in her left hand.  Certain movements seem to aggravate it.  She is not doing any particular treatments such as icing  or anti-inflammatory etc.  Past Medical History:  Diagnosis Date  . Bronchitis   . GERD (gastroesophageal reflux disease)   . Pseudogout    Dr. Ouida Sills  . RA (rheumatoid arthritis) ([place])    Follow by Tobie Lords, MD - [facility]  . Thyroid disease    hypothyroidism    Past Surgical History:  Procedure Laterality Date  . ABDOMINAL SURGERY    . arthroscopic surgery     both knees, RT 1985 removed patella, LT 1988  . ESSURE TUBAL LIGATION    . exploratory lap  1985, 2004   for ovarian cysts, infertility    Family History  Problem Relation Age of Onset  . Cancer Father        prostate  . Coronary artery disease Father   . Depression Father   . Hypertension Mother   . Diabetes Mother   . Diabetes Unknown        family history  . Hypertension Unknown        family history    Social History   Socioeconomic History  . Marital status: Married    Spouse name: Not on file  . Number of children: Not on file  . Years of education: Not on file  . Highest education level: Not on file  Occupational History  . Not on file  Social Needs  . Emergency planning/management officer  strain: Not on file  . Food insecurity    Worry: Not on file    Inability: Not on file  . Transportation needs    Medical: Not on file    Non-medical: Not on file  Tobacco Use  . Smoking status: Never Smoker  . Smokeless tobacco: Never Used  Substance and Sexual Activity  . Alcohol use: Yes  . Drug use: No  . Sexual activity: Not on file    Comment: married, no regular exercise, limited by knee pain, has son.   Lifestyle  . Physical activity    Days per week: Not on file    Minutes per session: Not on file  . Stress: Not on file  Relationships  . Social Herbalist on phone: Not on file    Gets together: Not on file    Attends religious service: Not on file    Active member of club or organization: Not on file    Attends meetings of clubs or organizations: Not on file     Relationship status: Not on file  . Intimate partner violence    Fear of current or ex partner: Not on file    Emotionally abused: Not on file    Physically abused: Not on file    Forced sexual activity: Not on file  Other Topics Concern  . Not on file  Social History Narrative  . Not on file    Outpatient Medications Prior to Visit  Medication Sig Dispense Refill  . buPROPion (WELLBUTRIN XL) 300 MG 24 hr tablet TAKE 1 TABLET DAILY 90 tablet 3  . COLCRYS 0.6 MG tablet Take 0.6 mg by mouth daily.  2  . folic acid (FOLVITE) 1 MG tablet     . Golimumab ([place] ARIA IV) Inject into the vein.    Marland Kitchen ibuprofen (ADVIL) 800 MG tablet Take 1 tablet (800 mg total) by mouth every 8 (eight) hours as needed. 60 tablet 0  . levothyroxine (SYNTHROID) 75 MCG tablet TAKE 1 TABLET DAILY BEFORE BREAKFAST 90 tablet 3  . methotrexate (RHEUMATREX) 2.5 MG tablet Take 2.5 mg by mouth once a week. Take 9 tablets by mouth once a week    . omeprazole (PRILOSEC) 20 MG capsule TAKE 1 CAPSULE DAILY 90 capsule 3  . albuterol (PROVENTIL HFA;VENTOLIN HFA) 108 (90 Base) MCG/ACT inhaler Inhale 1-2 puffs into the lungs every 6 hours as needed for bronchospasm  18 Inhaler 3  . DULoxetine (CYMBALTA) 30 MG capsule Take 2 capsules (60 mg total) by mouth daily. 180 capsule 1  . tiZANidine (ZANAFLEX) 4 MG tablet 4 mg at bedtime as needed. 1-2 tablets at bedtime as needed for sleep    . HYDROcodone-acetaminophen (NORCO/VICODIN) 5-325 MG tablet Take 1 tablet by mouth every 6 (six) hours as needed for moderate pain. 30 tablet 0   No facility-administered medications prior to visit.     Allergies  Allergen Reactions  . Citalopram Other (See Comments)    QT prolongation  . Morphine Sulfate     ROS Review of Systems    Objective:    Physical Exam  Constitutional: She is oriented to person, place, and time. She appears well-developed and well-nourished.  HENT:  Head: Normocephalic and atraumatic.  Cardiovascular:  Normal rate, regular rhythm and normal heart sounds.  Pulmonary/Chest: Effort normal and breath sounds normal.  Neurological: She is alert and oriented to person, place, and time.  Skin: Skin is warm and dry.  Psychiatric: She has a  normal mood and affect. Her behavior is normal.    BP 121/82   Pulse (!) 104   Temp 98.2 F (36.8 C) (Oral)   Ht 5\' 5"  (1.651 m)   Wt 206 lb (93.4 kg)   SpO2 98%   BMI 34.28 kg/m  Wt Readings from Last 3 Encounters:  07/19/19 206 lb (93.4 kg)  02/05/19 205 lb (93 kg)  10/30/18 191 lb 3.2 oz (86.7 kg)     Health Maintenance Due  Topic Date Due  . HIV Screening  12/13/1985  . PAP SMEAR-Modifier  12/25/2018  . INFLUENZA VACCINE  04/28/2019    There are no preventive care reminders to display for this patient.  Lab Results  Component Value Date   TSH 4.38 05/16/2018   Lab Results  Component Value Date   WBC 10.6 02/05/2019   HGB 14.7 02/05/2019   HCT 42.2 02/05/2019   MCV 94.6 02/05/2019   PLT 300 02/05/2019   Lab Results  Component Value Date   NA 137 05/16/2018   K 3.9 05/16/2018   CO2 27 05/16/2018   GLUCOSE 112 (H) 05/16/2018   BUN 16 05/16/2018   CREATININE 0.93 05/16/2018   BILITOT 1.0 05/16/2018   ALKPHOS 74 12/24/2013   AST 16 05/16/2018   ALT 17 05/16/2018   PROT 6.7 05/16/2018   ALBUMIN 3.9 12/24/2013   CALCIUM 9.3 05/16/2018   Lab Results  Component Value Date   CHOL 170 12/24/2013   Lab Results  Component Value Date   HDL 42 12/24/2013   Lab Results  Component Value Date   LDLCALC 105 (H) 12/24/2013   Lab Results  Component Value Date   TRIG 113 12/24/2013   Lab Results  Component Value Date   CHOLHDL 4.0 12/24/2013   Lab Results  Component Value Date   HGBA1C 5.4 05/16/2018      Assessment & Plan:   Problem List Items Addressed This Visit      Endocrine   Hypothyroidism - Primary    Due to recheck TSH.        Relevant Orders   TSH     Musculoskeletal and Integument   Rheumatoid  arthritis ([place])    Likely transitioning off of Simponi to Renovig      Relevant Medications   tiZANidine (ZANAFLEX) 4 MG tablet     Other   Hyperlipidemia    Will get copy of recent labs.      Relevant Orders   COMPLETE METABOLIC PANEL WITH GFR   Lipid panel   Depression    She is happy with her current regimen.  Encouraged her to continue with marital counseling.      Relevant Medications   DULoxetine (CYMBALTA) 30 MG capsule    Other Visit Diagnoses    Needs flu shot       Relevant Orders   Flu Vaccine QUAD 6+ mos PF IM (Fluarix Quad PF) (Completed)   Acute lower respiratory infection       Relevant Medications   albuterol (VENTOLIN HFA) 108 (90 Base) MCG/ACT inhaler   Left lateral epicondylitis       Relevant Medications   tiZANidine (ZANAFLEX) 4 MG tablet     Left lateral epicondylitis-exam most consistent with this diagnosis.  Handout provided for stretches and exercises to do on her own pick them.  Recommend icing and anti-inflammatory as well as tennis elbow strap for relief.  If she is not proving over the next 3 to 4 weeks then  recommend that she come in for possible injection and further work-up.   Meds ordered this encounter  Medications  . albuterol (VENTOLIN HFA) 108 (90 Base) MCG/ACT inhaler    Sig: Inhale 1-2 puffs into the lungs every 6 hours as needed for bronchospasm    Dispense:  18 g    Refill:  1  . DULoxetine (CYMBALTA) 30 MG capsule    Sig: Take 2 capsules (60 mg total) by mouth daily.    Dispense:  180 capsule    Refill:  1     Follow-up: Return in about 6 months (around 01/17/2020) for medication and mood.    Beatrice Lecher, MD

## 2019-07-19 NOTE — Assessment & Plan Note (Signed)
Will get copy of recent labs.

## 2019-07-19 NOTE — Telephone Encounter (Signed)
Called and labs are being faxed

## 2019-07-19 NOTE — Assessment & Plan Note (Signed)
Due to recheck TSH. 

## 2019-07-19 NOTE — Patient Instructions (Signed)
See handout for tennis elbow

## 2019-07-19 NOTE — Assessment & Plan Note (Signed)
She is happy with her current regimen.  Encouraged her to continue with marital counseling.

## 2019-07-19 NOTE — Assessment & Plan Note (Addendum)
Likely transitioning off of Simponi to Renovig

## 2019-07-19 NOTE — Telephone Encounter (Signed)
Please call patient's rheumatologist, Gavin Pound to get rest recent labs.  I am particularly interested in her CMP and lipids so that we can get those entered into her chart here

## 2019-07-20 ENCOUNTER — Telehealth: Payer: Self-pay | Admitting: *Deleted

## 2019-07-20 ENCOUNTER — Other Ambulatory Visit: Payer: Self-pay

## 2019-07-20 ENCOUNTER — Other Ambulatory Visit: Payer: Self-pay | Admitting: Family Medicine

## 2019-07-20 DIAGNOSIS — E039 Hypothyroidism, unspecified: Secondary | ICD-10-CM

## 2019-07-20 LAB — COMPLETE METABOLIC PANEL WITH GFR
AG Ratio: 1.7 (calc) (ref 1.0–2.5)
ALT: 33 U/L — ABNORMAL HIGH (ref 6–29)
AST: 18 U/L (ref 10–35)
Albumin: 4.3 g/dL (ref 3.6–5.1)
Alkaline phosphatase (APISO): 63 U/L (ref 31–125)
BUN: 17 mg/dL (ref 7–25)
CO2: 31 mmol/L (ref 20–32)
Calcium: 9.9 mg/dL (ref 8.6–10.2)
Chloride: 101 mmol/L (ref 98–110)
Creat: 0.92 mg/dL (ref 0.50–1.10)
GFR, Est African American: 85 mL/min/{1.73_m2} (ref >=60)
GFR, Est Non African American: 74 mL/min/{1.73_m2} (ref >=60)
Globulin: 2.6 g/dL (calc) (ref 1.9–3.7)
Glucose, Bld: 102 mg/dL — ABNORMAL HIGH (ref 65–99)
Potassium: 4.7 mmol/L (ref 3.5–5.3)
Sodium: 139 mmol/L (ref 135–146)
Total Bilirubin: 1.1 mg/dL (ref 0.2–1.2)
Total Protein: 6.9 g/dL (ref 6.1–8.1)

## 2019-07-20 LAB — LIPID PANEL
Cholesterol: 233 mg/dL — ABNORMAL HIGH (ref <200)
HDL: 56 mg/dL (ref >=50)
LDL Cholesterol (Calc): 147 mg/dL (calc) — ABNORMAL HIGH
Non-HDL Cholesterol (Calc): 177 mg/dL (calc) — ABNORMAL HIGH (ref <130)
Total CHOL/HDL Ratio: 4.2 (calc) (ref <5.0)
Triglycerides: 162 mg/dL — ABNORMAL HIGH (ref <150)

## 2019-07-20 LAB — TSH: TSH: 4.9 mIU/L — ABNORMAL HIGH

## 2019-07-20 MED ORDER — LEVOTHYROXINE SODIUM 88 MCG PO TABS: 88.0000 ug | ORAL_TABLET | Freq: Every day | ORAL | 1 refills | Status: DC

## 2019-07-20 MED ORDER — TRAZODONE HCL 150 MG PO TABS: 150.0000 mg | ORAL_TABLET | Freq: Every day | ORAL | 1 refills | Status: DC

## 2019-07-20 NOTE — Telephone Encounter (Signed)
ERROR

## 2019-07-20 NOTE — Progress Notes (Signed)
New med sent for higher stregth of thyroid medication.

## 2019-08-02 DIAGNOSIS — R05 Cough: Secondary | ICD-10-CM | POA: Diagnosis not present

## 2019-08-02 DIAGNOSIS — U071 COVID-19: Secondary | ICD-10-CM | POA: Diagnosis not present

## 2019-08-09 ENCOUNTER — Encounter: Payer: Self-pay | Admitting: Family Medicine

## 2019-08-10 ENCOUNTER — Ambulatory Visit (INDEPENDENT_AMBULATORY_CARE_PROVIDER_SITE_OTHER): Payer: BC Managed Care – PPO | Admitting: Physician Assistant

## 2019-08-10 ENCOUNTER — Encounter: Payer: Self-pay | Admitting: Physician Assistant

## 2019-08-10 VITALS — Temp 98.9°F | Ht 65.0 in | Wt 192.0 lb

## 2019-08-10 DIAGNOSIS — U071 COVID-19: Secondary | ICD-10-CM

## 2019-08-10 DIAGNOSIS — J40 Bronchitis, not specified as acute or chronic: Secondary | ICD-10-CM | POA: Diagnosis not present

## 2019-08-10 DIAGNOSIS — Z8616 Personal history of COVID-19: Secondary | ICD-10-CM

## 2019-08-10 DIAGNOSIS — J329 Chronic sinusitis, unspecified: Secondary | ICD-10-CM

## 2019-08-10 HISTORY — DX: Personal history of COVID-19: Z86.16

## 2019-08-10 MED ORDER — AZITHROMYCIN 250 MG PO TABS: ORAL_TABLET | ORAL | 0 refills | Status: DC

## 2019-08-10 MED ORDER — PREDNISONE 50 MG PO TABS: ORAL_TABLET | ORAL | 0 refills | Status: DC

## 2019-08-10 NOTE — Progress Notes (Signed)
Patient ID: Nancy Wang, female   DOB: May 27, 1971, 48 y.o.   MRN: [id number]

## 2019-08-10 NOTE — Progress Notes (Signed)
Patient ID: Nancy Wang, female   DOB: 02/09/1971, 48 y.o.   MRN: [id number] .Virtual Visit via Video Note  I connected with Nancy Wang on 08/10/19 at 10:50 AM EST by a video enabled telemedicine application and verified that I am speaking with the correct person using two identifiers.  Location: Patient: home Provider: clinic   I discussed the limitations of evaluation and management by telemedicine and the availability of in person appointments. The patient expressed understanding and agreed to proceed.  History of Present Illness: Patient is a 48 year old female with rheumatoid arthritis who comes into the clinic with worsening Covid symptoms.  She was diagnosed on 11/4 with Covid.  Her husband was also positive.  Her symptoms started with cough and exhaustion at the beginning.  She has run intermittent fevers of the highest at 101.7.  She continues to still have chills and sweats.  Her cough is beginning to get harder and more productive.  She has an ongoing headache.  She does have some chest tightness but no shortness of breath.  She does have an albuterol inhaler at home for her intermittent reactive airway concerns when she would get sick.  She has no diagnosis of pneumonia.  She continues to take Mucinex, ibuprofen and sleeping a lot.   .. Active Ambulatory Problems    Diagnosis Date Noted  . Hypothyroidism 03/31/2009  . PSEUDOGOUT 05/26/2009  . Depression 06/26/2010  . Rheumatoid arthritis ([place]) 05/26/2009  . KNEE PAIN, LEFT 03/31/2009  . IRON, SERUM, ELEVATED 05/26/2009  . ELEVATED BLOOD PRESSURE 05/26/2009  . HYPOXEMIA 09/02/2010  . ALLERGIC RHINITIS 10/08/2010  . UNSPECIFIED VITAMIN D DEFICIENCY 12/10/2010  . QT prolongation 08/02/2013  . Insomnia 06/07/2014  . Hyperlipidemia 03/05/2015  . 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 Upper airway cough syndrome 10/30/2018  . Solitary pulmonary nodule on lung CT 10/30/2018  . Lymphadenopathy 02/05/2019   . Immunocompromised patient ([place]) 02/05/2019  . Localized swelling, mass or lump of neck 02/05/2019  . COVID-19 virus infection 08/10/2019   Resolved Ambulatory Problems    Diagnosis Date Noted  . VIRAL URI 08/25/2010  . PNEUMONIA, ATYPICAL 09/02/2010  . Wheezing 09/02/2010  . Cough 09/02/2010  . Atypical pneumonia 05/07/2011  . Acute bronchitis 04/30/2014   Past Medical History:  Diagnosis Date  . Bronchitis   . GERD (gastroesophageal reflux disease)   . Pseudogout   . RA (rheumatoid arthritis) ([place])   . Thyroid disease    Reviewed med, allergies, problem list.    Observations/Objective: No acute distress. No labored breathing. Dry to productive hacking cough throughout encounter.  Flushed cheeks.  .. Today's Vitals   08/10/19 1030  Temp: 98.9 F (37.2 C)  TempSrc: Oral  Weight: 192 lb (87.1 kg)  Height: 5\' 5"  (1.651 m)   Body mass index is 31.95 kg/m.    Assessment and Plan: Marland KitchenMarland KitchenDiagnoses and all orders for this visit:  COVID-19 virus infection -     predniSONE (DELTASONE) 50 MG tablet; One tab PO daily for 5 days.  Sinobronchitis -     azithromycin (ZITHROMAX) 250 MG tablet; Take 2 tablets now and then one tablet for 4 days. -     predniSONE (DELTASONE) 50 MG tablet; One tab PO daily for 5 days.   Discussed suspected symptoms of Covid.  I am concerned that she could have a secondary sinus/pneumonia infection.  Treated with Z-Pak and burst of prednisone.  Pt told to hold colchine while on zpak to due interaction. Continue other symptomatic treatment.  Rest and hydrate.  Patient is still in quarantine till 08/15/2019.  Follow-up as needed.pt aware to go to ED with worsening SOB/breathing issues.    Follow Up Instructions:    I discussed the assessment and treatment plan with the patient. The patient was provided an opportunity to ask questions and all were answered. The patient agreed with the plan and demonstrated an understanding of the instructions.    The patient was advised to call back or seek an in-person evaluation if the symptoms worsen or if the condition fails to improve as anticipated.    Iran Planas, PA-C

## 2019-08-14 ENCOUNTER — Ambulatory Visit (INDEPENDENT_AMBULATORY_CARE_PROVIDER_SITE_OTHER): Payer: BC Managed Care – PPO | Admitting: Physician Assistant

## 2019-08-14 ENCOUNTER — Ambulatory Visit (INDEPENDENT_AMBULATORY_CARE_PROVIDER_SITE_OTHER): Payer: BC Managed Care – PPO

## 2019-08-14 ENCOUNTER — Other Ambulatory Visit: Payer: Self-pay

## 2019-08-14 VITALS — Temp 99.0°F | Ht 65.0 in | Wt 192.0 lb

## 2019-08-14 DIAGNOSIS — U071 COVID-19: Secondary | ICD-10-CM

## 2019-08-14 DIAGNOSIS — R059 Cough, unspecified: Secondary | ICD-10-CM

## 2019-08-14 DIAGNOSIS — R0602 Shortness of breath: Secondary | ICD-10-CM | POA: Diagnosis not present

## 2019-08-14 DIAGNOSIS — R05 Cough: Secondary | ICD-10-CM | POA: Diagnosis not present

## 2019-08-14 DIAGNOSIS — R6883 Chills (without fever): Secondary | ICD-10-CM | POA: Diagnosis not present

## 2019-08-14 NOTE — Progress Notes (Signed)
Patient ID: Nancy Wang, female   DOB: Jan 27, 1971, 48 y.o.   MRN: [id number] .Marland KitchenVirtual Visit via Video Note  I connected with Nancy Wang on 08/14/19 at  1:20 PM EST by a video enabled telemedicine application and verified that I am speaking with the correct person using two identifiers.  Location: Patient: home Provider: clinic   I discussed the limitations of evaluation and management by telemedicine and the availability of in person appointments. The patient expressed understanding and agreed to proceed.  History of Present Illness: Patient is a 48 year old female who connects virtually to follow-up on COVID-19 symptoms.  She was diagnosed earlier this month with COVID-19.  Her quarantine is supposed to end on 08/15/2019.  She was seen late last week for suspected secondary infection and given Z-Pak and prednisone.  She is concerned because she still not feeling well.  She admits to feeling like she still having sweats and chills. No fever in last few days.  She feels like she is breathing okay but moving around or doing anything is very hard to do.  She denies any swelling in her legs, ankles, hands.  She denies any problems lying flat.  She continues to feel very weak and fatigued.  Patient's cough is still dry and hacking with times of production.  Cough at times can keep her up at night.  She finished her last dose of Z-Pak and prednisone today.  She has not been using her albuterol.   .. Active Ambulatory Problems    Diagnosis Date Noted  . Hypothyroidism 03/31/2009  . PSEUDOGOUT 05/26/2009  . Depression 06/26/2010  . Rheumatoid arthritis ([place]) 05/26/2009  . KNEE PAIN, LEFT 03/31/2009  . IRON, SERUM, ELEVATED 05/26/2009  . ELEVATED BLOOD PRESSURE 05/26/2009  . HYPOXEMIA 09/02/2010  . ALLERGIC RHINITIS 10/08/2010  . UNSPECIFIED VITAMIN D DEFICIENCY 12/10/2010  . QT prolongation 08/02/2013  . Insomnia 06/07/2014  . Hyperlipidemia 03/05/2015  . Immunosuppressed due to  chemotherapy 10/17/2017  . Upper airway cough syndrome 10/30/2018  . Solitary pulmonary nodule on lung CT 10/30/2018  . Lymphadenopathy 02/05/2019  . Immunocompromised patient ([place]) 02/05/2019  . Localized swelling, mass or lump of neck 02/05/2019  . COVID-19 virus infection 08/10/2019   Resolved Ambulatory Problems    Diagnosis Date Noted  . VIRAL URI 08/25/2010  . PNEUMONIA, ATYPICAL 09/02/2010  . Wheezing 09/02/2010  . Cough 09/02/2010  . Atypical pneumonia 05/07/2011  . Acute bronchitis 04/30/2014   Past Medical History:  Diagnosis Date  . Bronchitis   . GERD (gastroesophageal reflux disease)   . Pseudogout   . RA (rheumatoid arthritis) ([place])   . Thyroid disease    Reviewed med, allergy, problem list.   Observations/Objective: No acute distress Dry hacking cough on exam. Flush cheeks. Weak appearance.  .. Today's Vitals   08/14/19 1136  Temp: 99 F (37.2 C)  TempSrc: Oral  Weight: 192 lb (87.1 kg)  Height: 5\' 5"  (1.651 m)   Body mass index is 31.95 kg/m.    Assessment and Plan: .Marland KitchenMarland KitchenKyandra was seen today for cough and fatigue.  Diagnoses and all orders for this visit:  COVID-19 virus infection -     CXR 2 view  Chills -     CXR 2 view  Cough -     CXR 2 view  SOB (shortness of breath) -     CXR 2 view   Reassurance that she still appears to be moving in the right direction.  She is not having fevers.  I  am concerned about some of the sequela of COVID-19.  Patient does not have a pulse ox at home.  I would like her to reach out and get one.  And to test her oxygen saturation at rest and with ambulation.  Discussed with her if this drops below 90 with ambulation she should go to the hospital to be evaluated.  Arrange for her to have a chest x-ray today to confirm no fluid around lungs or any signs of respiratory distress.  Discussed fatigue and residual cough take longer for some people than others.  She is considered immunocompromised due to her  methotrexate.  Reassured her that Z-Pak continues to work.  It could be that prednisone is causing some of her sweats and anxiety feeling.  She is finishing her last dose today.  Encourage use of albuterol 1 to 2 puffs every 2-4 hours.  Given a cough syrup for bedtime.  I do not think she is stable enough to be released back to work.  We will reassess on Friday.   CXR normal. Reassuring.   Worsening SOB, leg swelling go to ED to be evaluated.    Follow Up Instructions:    I discussed the assessment and treatment plan with the patient. The patient was provided an opportunity to ask questions and all were answered. The patient agreed with the plan and demonstrated an understanding of the instructions.   The patient was advised to call back or seek an in-person evaluation if the symptoms worsen or if the condition fails to improve as anticipated.    Iran Planas, PA-C

## 2019-08-14 NOTE — Progress Notes (Signed)
Very reassuring. No edema or consolidation. Will send cough syrup.

## 2019-08-14 NOTE — Progress Notes (Signed)
Feeling just as bad as Friday. Today was last day of Z-pack Cough  Fatigue Sweats

## 2019-08-15 ENCOUNTER — Encounter: Payer: Self-pay | Admitting: Physician Assistant

## 2019-08-15 MED ORDER — HYDROCOD POLST-CPM POLST ER 10-8 MG/5ML PO SUER: 5.0000 mL | Freq: Two times a day (BID) | ORAL | 0 refills | Status: DC | PRN

## 2019-09-11 ENCOUNTER — Other Ambulatory Visit: Payer: Self-pay | Admitting: Family Medicine

## 2019-09-11 DIAGNOSIS — J22 Unspecified acute lower respiratory infection: Secondary | ICD-10-CM

## 2019-09-12 ENCOUNTER — Other Ambulatory Visit: Payer: Self-pay | Admitting: Family Medicine

## 2019-09-12 DIAGNOSIS — E039 Hypothyroidism, unspecified: Secondary | ICD-10-CM

## 2019-09-19 DIAGNOSIS — M150 Primary generalized (osteo)arthritis: Secondary | ICD-10-CM | POA: Diagnosis not present

## 2019-09-19 DIAGNOSIS — M2550 Pain in unspecified joint: Secondary | ICD-10-CM | POA: Diagnosis not present

## 2019-09-19 DIAGNOSIS — M0589 Other rheumatoid arthritis with rheumatoid factor of multiple sites: Secondary | ICD-10-CM | POA: Diagnosis not present

## 2019-09-19 DIAGNOSIS — Z13220 Encounter for screening for lipoid disorders: Secondary | ICD-10-CM | POA: Diagnosis not present

## 2019-09-19 DIAGNOSIS — M1120 Other chondrocalcinosis, unspecified site: Secondary | ICD-10-CM | POA: Diagnosis not present

## 2019-10-15 ENCOUNTER — Ambulatory Visit: Payer: BC Managed Care – PPO | Admitting: Nurse Practitioner

## 2019-10-15 ENCOUNTER — Telehealth: Payer: Self-pay

## 2019-10-15 DIAGNOSIS — Z8616 Personal history of COVID-19: Secondary | ICD-10-CM | POA: Diagnosis not present

## 2019-10-15 DIAGNOSIS — Z79899 Other long term (current) drug therapy: Secondary | ICD-10-CM | POA: Diagnosis not present

## 2019-10-15 DIAGNOSIS — R0602 Shortness of breath: Secondary | ICD-10-CM | POA: Diagnosis not present

## 2019-10-15 DIAGNOSIS — R05 Cough: Secondary | ICD-10-CM | POA: Diagnosis not present

## 2019-10-15 DIAGNOSIS — Z888 Allergy status to other drugs, medicaments and biological substances status: Secondary | ICD-10-CM | POA: Diagnosis not present

## 2019-10-15 DIAGNOSIS — R42 Dizziness and giddiness: Secondary | ICD-10-CM | POA: Diagnosis not present

## 2019-10-15 DIAGNOSIS — R5383 Other fatigue: Secondary | ICD-10-CM | POA: Diagnosis not present

## 2019-10-15 DIAGNOSIS — F418 Other specified anxiety disorders: Secondary | ICD-10-CM | POA: Diagnosis not present

## 2019-10-15 NOTE — Telephone Encounter (Signed)
Patient calls with complaints of chest pain and congestion, difficulty breathing, extreme exhaustion, O2 level of 93, and feeling very faint. Patient wanted appointment here to be evaluated. She was COVID positive   Spoke with patient and advised her that she needs to be physically evaluated in ER. Patient was very understanding and agreeable. No further needs at this time.   FYI to PCP

## 2019-10-16 ENCOUNTER — Other Ambulatory Visit: Payer: Self-pay | Admitting: Family Medicine

## 2019-10-16 DIAGNOSIS — F321 Major depressive disorder, single episode, moderate: Secondary | ICD-10-CM

## 2019-11-12 ENCOUNTER — Other Ambulatory Visit: Payer: Self-pay | Admitting: *Deleted

## 2019-11-12 DIAGNOSIS — E039 Hypothyroidism, unspecified: Secondary | ICD-10-CM

## 2019-11-12 MED ORDER — LEVOTHYROXINE SODIUM 88 MCG PO TABS: 88.0000 ug | ORAL_TABLET | Freq: Every day | ORAL | 1 refills | Status: DC

## 2019-11-12 MED ORDER — TRAZODONE HCL 150 MG PO TABS: 150.0000 mg | ORAL_TABLET | Freq: Every day | ORAL | 1 refills | Status: DC

## 2019-11-15 ENCOUNTER — Other Ambulatory Visit: Payer: Self-pay | Admitting: Family Medicine

## 2019-11-15 DIAGNOSIS — E039 Hypothyroidism, unspecified: Secondary | ICD-10-CM

## 2019-12-10 ENCOUNTER — Other Ambulatory Visit: Payer: Self-pay | Admitting: Family Medicine

## 2019-12-10 DIAGNOSIS — F321 Major depressive disorder, single episode, moderate: Secondary | ICD-10-CM

## 2020-01-15 IMAGING — DX DG CHEST 2V
2 series · 2 of 2 positions shown · non-contrast
Comparison: October 10, 2018

CLINICAL DATA: Cough and shortness of breath.  [id number] positive

EXAM:
CHEST - 2 VIEW

[chest pa]
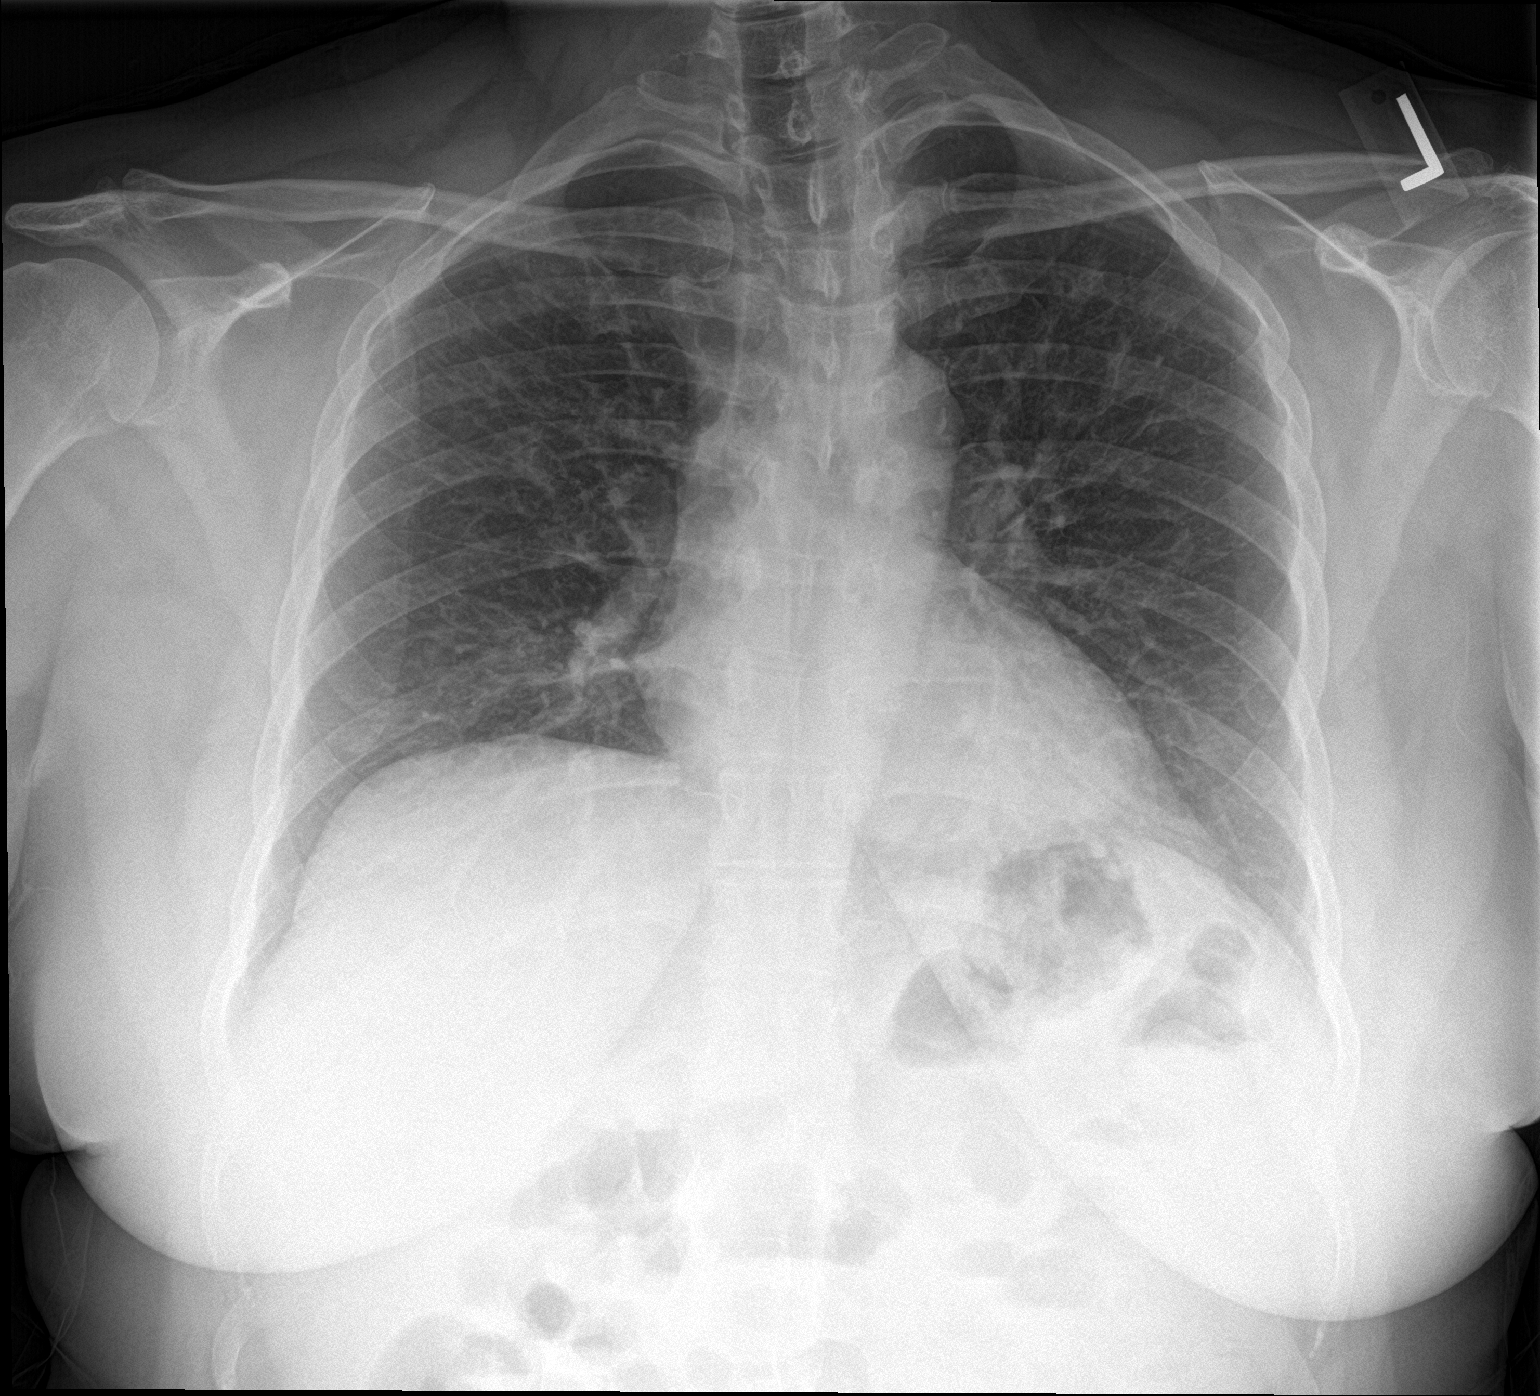

[chest lat]
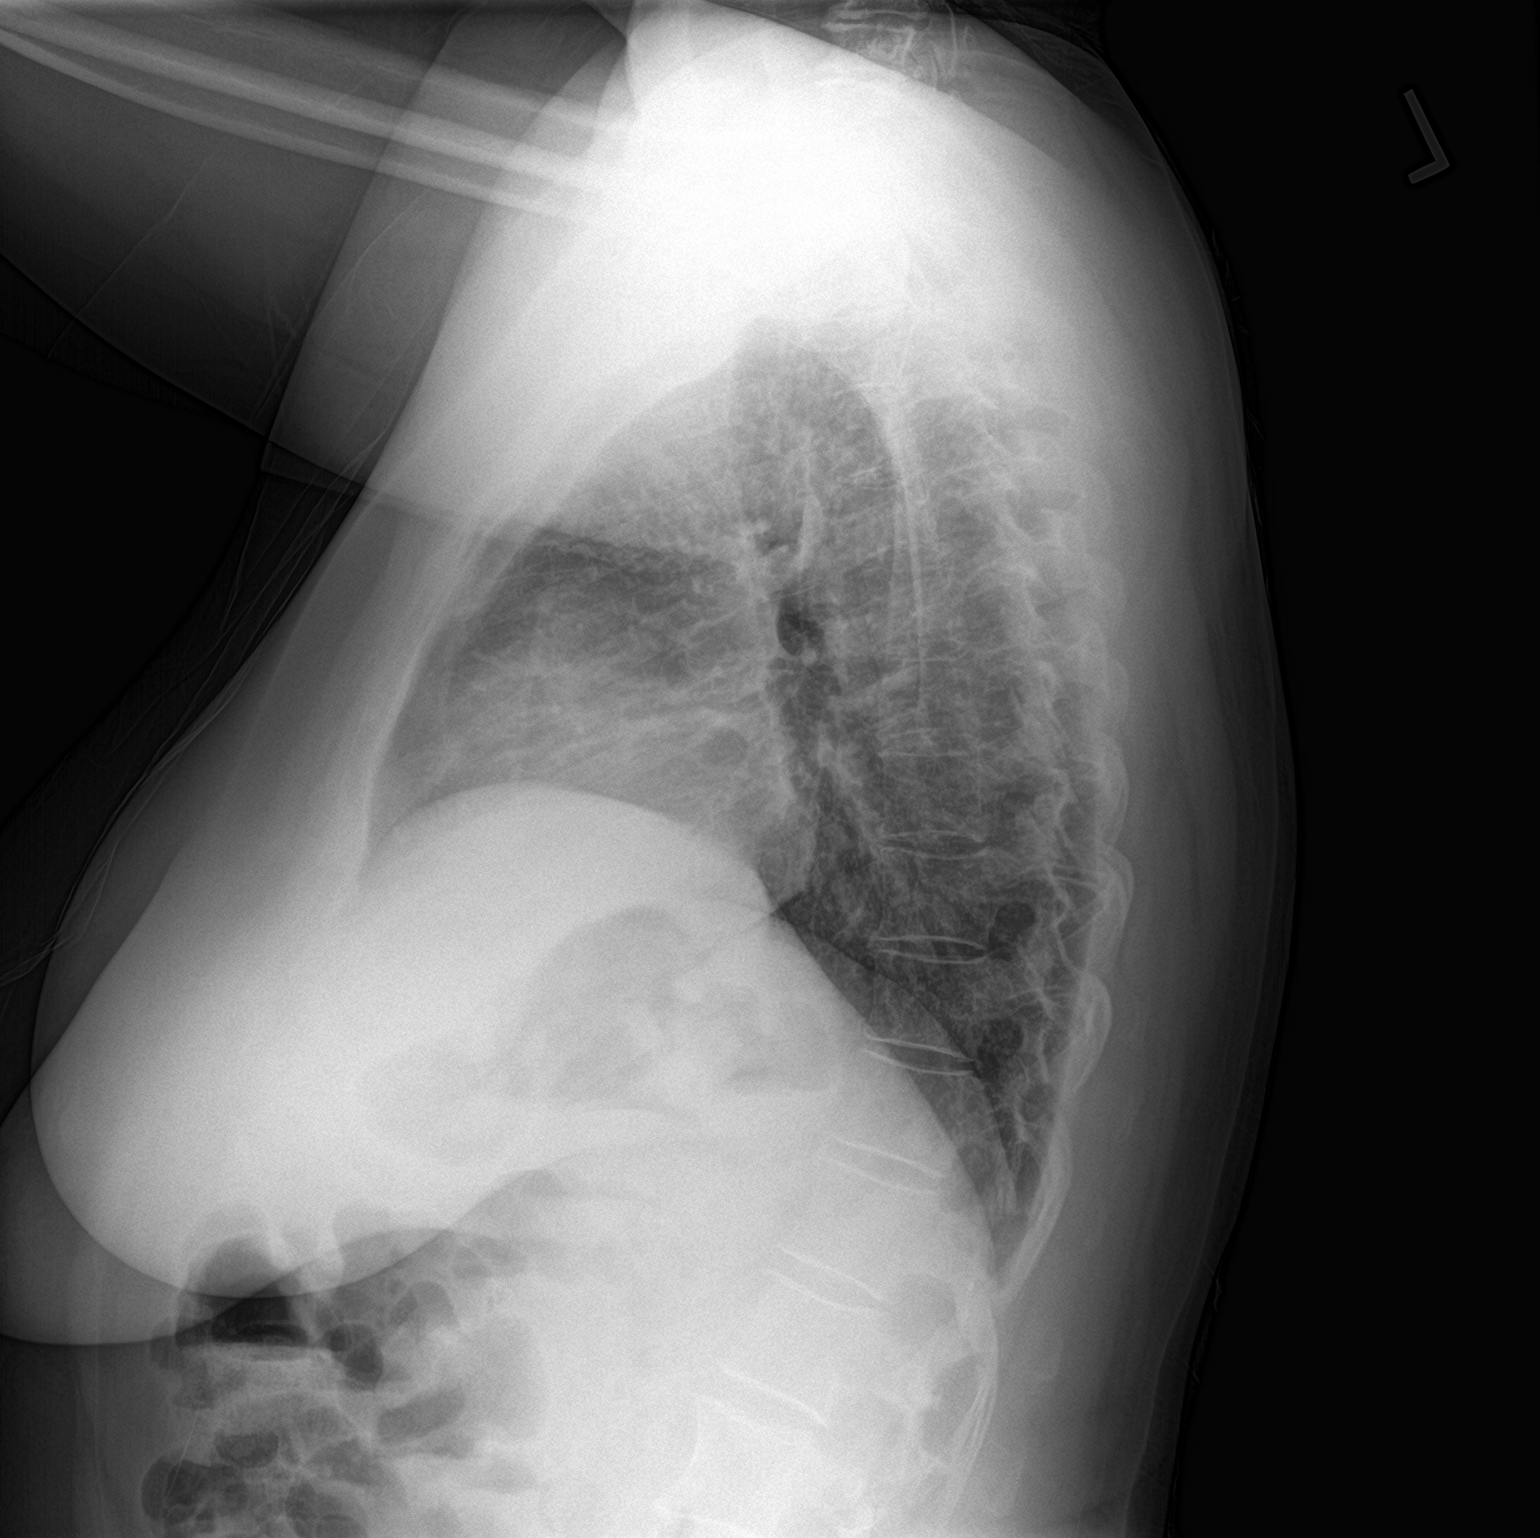

[2 of 2 positions shown; findings below may reference images not displayed]

FINDINGS: The lungs are clear. The heart size and pulmonary vascularity are
normal. No adenopathy. No bone lesions.
IMPRESSION: No edema or consolidation.  No adenopathy.

## 2020-02-05 ENCOUNTER — Other Ambulatory Visit (HOSPITAL_COMMUNITY)
Admission: RE | Admit: 2020-02-05 | Discharge: 2020-02-05 | Disposition: A | Discharge status: Home / Self Care | Payer: BC Managed Care – PPO | Source: Ambulatory Visit | Attending: Family Medicine | Admitting: Family Medicine

## 2020-02-05 ENCOUNTER — Encounter: Payer: Self-pay | Admitting: Family Medicine

## 2020-02-05 ENCOUNTER — Ambulatory Visit (INDEPENDENT_AMBULATORY_CARE_PROVIDER_SITE_OTHER): Payer: BC Managed Care – PPO | Admitting: Family Medicine

## 2020-02-05 ENCOUNTER — Other Ambulatory Visit: Payer: Self-pay

## 2020-02-05 VITALS — BP 121/69 | HR 99 | Ht 65.0 in | Wt 200.0 lb

## 2020-02-05 DIAGNOSIS — Z124 Encounter for screening for malignant neoplasm of cervix: Secondary | ICD-10-CM | POA: Diagnosis not present

## 2020-02-05 DIAGNOSIS — J22 Unspecified acute lower respiratory infection: Secondary | ICD-10-CM | POA: Diagnosis not present

## 2020-02-05 DIAGNOSIS — Z0000 Encounter for general adult medical examination without abnormal findings: Secondary | ICD-10-CM | POA: Insufficient documentation

## 2020-02-05 DIAGNOSIS — F321 Major depressive disorder, single episode, moderate: Secondary | ICD-10-CM

## 2020-02-05 DIAGNOSIS — Z1231 Encounter for screening mammogram for malignant neoplasm of breast: Secondary | ICD-10-CM

## 2020-02-05 DIAGNOSIS — E039 Hypothyroidism, unspecified: Secondary | ICD-10-CM | POA: Diagnosis not present

## 2020-02-05 MED ORDER — ALBUTEROL SULFATE HFA 108 (90 BASE) MCG/ACT IN AERS: INHALATION_SPRAY | RESPIRATORY_TRACT | 1 refills | Status: DC

## 2020-02-05 MED ORDER — DULOXETINE HCL 30 MG PO CPEP: 30.0000 mg | ORAL_CAPSULE | Freq: Every day | ORAL | 0 refills | Status: DC

## 2020-02-05 NOTE — Patient Instructions (Signed)
Health Maintenance, Female Adopting a healthy lifestyle and getting preventive care are important in promoting health and wellness. Ask your health care provider about:  The right schedule for you to have regular tests and exams.  Things you can do on your own to prevent diseases and keep yourself healthy. What should I know about diet, weight, and exercise? Eat a healthy diet   Eat a diet that includes plenty of vegetables, fruits, low-fat dairy products, and lean protein.  Do not eat a lot of foods that are high in solid fats, added sugars, or sodium. Maintain a healthy weight Body mass index (BMI) is used to identify weight problems. It estimates body fat based on height and weight. Your health care provider can help determine your BMI and help you achieve or maintain a healthy weight. Get regular exercise Get regular exercise. This is one of the most important things you can do for your health. Most adults should:  Exercise for at least 150 minutes each week. The exercise should increase your heart rate and make you sweat (moderate-intensity exercise).  Do strengthening exercises at least twice a week. This is in addition to the moderate-intensity exercise.  Spend less time sitting. Even light physical activity can be beneficial. Watch cholesterol and blood lipids Have your blood tested for lipids and cholesterol at 49 years of age, then have this test every 5 years. Have your cholesterol levels checked more often if:  Your lipid or cholesterol levels are high.  You are older than 49 years of age.  You are at high risk for heart disease. What should I know about cancer screening? Depending on your health history and family history, you may need to have cancer screening at various ages. This may include screening for:  Breast cancer.  Cervical cancer.  Colorectal cancer.  Skin cancer.  Lung cancer. What should I know about heart disease, diabetes, and high blood  pressure? Blood pressure and heart disease  High blood pressure causes heart disease and increases the risk of stroke. This is more likely to develop in people who have high blood pressure readings, are of African descent, or are overweight.  Have your blood pressure checked: ? Every 3-5 years if you are 18-39 years of age. ? Every year if you are 40 years old or older. Diabetes Have regular diabetes screenings. This checks your fasting blood sugar level. Have the screening done:  Once every three years after age 40 if you are at a normal weight and have a low risk for diabetes.  More often and at a younger age if you are overweight or have a high risk for diabetes. What should I know about preventing infection? Hepatitis B If you have a higher risk for hepatitis B, you should be screened for this virus. Talk with your health care provider to find out if you are at risk for hepatitis B infection. Hepatitis C Testing is recommended for:  Everyone born from 1945 through 1965.  Anyone with known risk factors for hepatitis C. Sexually transmitted infections (STIs)  Get screened for STIs, including gonorrhea and chlamydia, if: ? You are sexually active and are younger than 49 years of age. ? You are older than 49 years of age and your health care provider tells you that you are at risk for this type of infection. ? Your sexual activity has changed since you were last screened, and you are at increased risk for chlamydia or gonorrhea. Ask your health care provider if   you are at risk.  Ask your health care provider about whether you are at high risk for HIV. Your health care provider may recommend a prescription medicine to help prevent HIV infection. If you choose to take medicine to prevent HIV, you should first get tested for HIV. You should then be tested every 3 months for as long as you are taking the medicine. Pregnancy  If you are about to stop having your period (premenopausal) and  you may become pregnant, seek counseling before you get pregnant.  Take 400 to 800 micrograms (mcg) of folic acid every day if you become pregnant.  Ask for birth control (contraception) if you want to prevent pregnancy. Osteoporosis and menopause Osteoporosis is a disease in which the bones lose minerals and strength with aging. This can result in bone fractures. If you are 65 years old or older, or if you are at risk for osteoporosis and fractures, ask your health care provider if you should:  Be screened for bone loss.  Take a calcium or vitamin D supplement to lower your risk of fractures.  Be given hormone replacement therapy (HRT) to treat symptoms of menopause. Follow these instructions at home: Lifestyle  Do not use any products that contain nicotine or tobacco, such as cigarettes, e-cigarettes, and chewing tobacco. If you need help quitting, ask your health care provider.  Do not use street drugs.  Do not share needles.  Ask your health care provider for help if you need support or information about quitting drugs. Alcohol use  Do not drink alcohol if: ? Your health care provider tells you not to drink. ? You are pregnant, may be pregnant, or are planning to become pregnant.  If you drink alcohol: ? Limit how much you use to 0-1 drink a day. ? Limit intake if you are breastfeeding.  Be aware of how much alcohol is in your drink. In the [place] one drink equals one 12 oz bottle of beer (355 mL), one 5 oz glass of wine (148 mL), or one 1 oz glass of hard liquor (44 mL). General instructions  Schedule regular health, dental, and eye exams.  Stay current with your vaccines.  Tell your health care provider if: ? You often feel depressed. ? You have ever been abused or do not feel safe at home. Summary  Adopting a healthy lifestyle and getting preventive care are important in promoting health and wellness.  Follow your health care provider's instructions about healthy  diet, exercising, and getting tested or screened for diseases.  Follow your health care provider's instructions on monitoring your cholesterol and blood pressure. This information is not intended to replace advice given to you by your health care provider. Make sure you discuss any questions you have with your health care provider. Document Revised: 09/06/2018 Document Reviewed: 09/06/2018 Elsevier Patient Education  2020 Elsevier Inc[employer]  

## 2020-02-05 NOTE — Progress Notes (Signed)
Subjective:     Nancy Wang is a 49 y.o. female and is here for a comprehensive physical exam. The patient reports problems - She has been feeling more anxious unfortunately she has been really struggling with some marital problems.  They were going to couples counseling.  Her husband wants to find a new counselor so she herself is considering seeing her own therapist.  She has a good support structure with some friends but her best friend will actually be moving out of state soon.  She has been having some physical symptoms she is been getting knots in her stomach almost daily.  She has not been sleeping well and she is even been having nightmares.  She is also thinking about getting her son Ovid Curd into counseling.   Social History   Socioeconomic History  . Marital status: Married    Spouse name: Not on file  . Number of children: Not on file  . Years of education: Not on file  . Highest education level: Not on file  Occupational History  . Not on file  Tobacco Use  . Smoking status: Never Smoker  . Smokeless tobacco: Never Used  Substance and Sexual Activity  . Alcohol use: Yes  . Drug use: No  . Sexual activity: Not on file    Comment: married, no regular exercise, limited by knee pain, has son.   Other Topics Concern  . Not on file  Social History Narrative  . Not on file   Social Determinants of Health   Financial Resource Strain:   . Difficulty of Paying Living Expenses:   Food Insecurity:   . Worried About Charity fundraiser in the Last Year:   . Arboriculturist in the Last Year:   Transportation Needs:   . Film/video editor (Medical):   Marland Kitchen Lack of Transportation (Non-Medical):   Physical Activity:   . Days of Exercise per Week:   . Minutes of Exercise per Session:   Stress:   . Feeling of Stress :   Social Connections:   . Frequency of Communication with Friends and Family:   . Frequency of Social Gatherings with Friends and Family:   . Attends  Religious Services:   . Active Member of Clubs or Organizations:   . Attends Archivist Meetings:   Marland Kitchen Marital Status:   Intimate Partner Violence:   . Fear of Current or Ex-Partner:   . Emotionally Abused:   Marland Kitchen Physically Abused:   . Sexually Abused:    Health Maintenance  Topic Date Due  . HIV Screening  Never done  . PAP SMEAR-Modifier  12/25/2018  . INFLUENZA VACCINE  04/27/2020  . TETANUS/TDAP  08/03/2023  . COVID-19 Vaccine  Completed    The following portions of the patient's history were reviewed and updated as appropriate: allergies, current medications, past family history, past medical history, past social history, past surgical history and problem list.  Review of Systems A comprehensive review of systems was negative.   Objective:    BP 121/69   Pulse 99   Ht 5\' 5"  (1.651 m)   Wt 200 lb (90.7 kg)   SpO2 99%   BMI 33.28 kg/m  General appearance: alert, cooperative and appears stated age Head: Normocephalic, without obvious abnormality, atraumatic Eyes: conj clear, EOMi, PEERLA Ears: normal TM's and external ear canals both ears Nose: Nares normal. Septum midline. Mucosa normal. No drainage or sinus tenderness. Throat: lips, mucosa, and tongue normal; teeth and  gums normal Neck: no adenopathy, no carotid bruit, no JVD, supple, symmetrical, trachea midline and thyroid not enlarged, symmetric, no tenderness/mass/nodules Back: symmetric, no curvature. ROM normal. No CVA tenderness. Lungs: clear to auscultation bilaterally Breasts: normal appearance, no masses or tenderness Heart: regular rate and rhythm, S1, S2 normal, no murmur, click, rub or gallop Abdomen: soft, non-tender; bowel sounds normal; no masses,  no organomegaly Pelvic: cervix normal in appearance, external genitalia normal, no adnexal masses or tenderness, no cervical motion tenderness, rectovaginal septum normal, uterus normal size, shape, and consistency and vagina normal without  discharge Extremities: extremities normal, atraumatic, no cyanosis or edema Pulses: 2+ and symmetric Skin: Skin color, texture, turgor normal. No rashes or lesions Lymph nodes: Cervical, supraclavicular, and axillary nodes normal. Neurologic: Mental status: Alert, oriented, thought content appropriate    Assessment:    Healthy female exam.      Plan:     See After Visit Summary for Counseling Recommendations   Keep up a regular exercise program and make sure you are eating a healthy diet Try to eat 4 servings of dairy a day, or if you are lactose intolerant take a calcium with vitamin D daily.  Your vaccines are up to date.   Marital stress-discussed options.  Did encourage her to get in with a therapist or counselor.  I think this could be really helpful for her more long-term.  We discussed we could also adjust her medication as well she is already on Cymbalta 60 mg we can try going up to 90 mg for about a month if that is not helpful then we could consider switching to something like Zoloft which could help more with some of the anxiety symptoms that she is currently experiencing.

## 2020-02-06 ENCOUNTER — Other Ambulatory Visit: Payer: Self-pay | Admitting: *Deleted

## 2020-02-06 DIAGNOSIS — J22 Unspecified acute lower respiratory infection: Secondary | ICD-10-CM

## 2020-02-06 MED ORDER — ALBUTEROL SULFATE HFA 108 (90 BASE) MCG/ACT IN AERS: INHALATION_SPRAY | RESPIRATORY_TRACT | 1 refills | Status: DC

## 2020-02-07 DIAGNOSIS — M2550 Pain in unspecified joint: Secondary | ICD-10-CM | POA: Diagnosis not present

## 2020-02-07 DIAGNOSIS — E039 Hypothyroidism, unspecified: Secondary | ICD-10-CM | POA: Diagnosis not present

## 2020-02-07 DIAGNOSIS — M150 Primary generalized (osteo)arthritis: Secondary | ICD-10-CM | POA: Diagnosis not present

## 2020-02-07 DIAGNOSIS — M1120 Other chondrocalcinosis, unspecified site: Secondary | ICD-10-CM | POA: Diagnosis not present

## 2020-02-07 DIAGNOSIS — M0589 Other rheumatoid arthritis with rheumatoid factor of multiple sites: Secondary | ICD-10-CM | POA: Diagnosis not present

## 2020-02-07 LAB — CYTOLOGY - PAP
Comment: NEGATIVE
Diagnosis: NEGATIVE
High risk HPV: NEGATIVE

## 2020-02-07 NOTE — Progress Notes (Signed)
Call patient: Your Pap smear is normal. Repeat in 5 years.

## 2020-02-15 ENCOUNTER — Other Ambulatory Visit: Payer: Self-pay | Admitting: *Deleted

## 2020-02-15 DIAGNOSIS — J22 Unspecified acute lower respiratory infection: Secondary | ICD-10-CM

## 2020-02-15 MED ORDER — ALBUTEROL SULFATE HFA 108 (90 BASE) MCG/ACT IN AERS: INHALATION_SPRAY | RESPIRATORY_TRACT | 1 refills | Status: DC

## 2020-02-20 ENCOUNTER — Other Ambulatory Visit: Payer: Self-pay | Admitting: *Deleted

## 2020-02-20 MED ORDER — TRAZODONE HCL 150 MG PO TABS: 150.0000 mg | ORAL_TABLET | Freq: Every day | ORAL | 1 refills | Status: DC

## 2020-02-29 ENCOUNTER — Ambulatory Visit: Payer: BC Managed Care – PPO | Admitting: Family Medicine

## 2020-03-04 ENCOUNTER — Ambulatory Visit: Payer: BC Managed Care – PPO | Admitting: Family Medicine

## 2020-03-05 DIAGNOSIS — Z20822 Contact with and (suspected) exposure to covid-19: Secondary | ICD-10-CM | POA: Diagnosis not present

## 2020-04-23 ENCOUNTER — Other Ambulatory Visit: Payer: Self-pay | Admitting: Family Medicine

## 2020-04-23 DIAGNOSIS — E039 Hypothyroidism, unspecified: Secondary | ICD-10-CM

## 2020-04-30 DIAGNOSIS — M150 Primary generalized (osteo)arthritis: Secondary | ICD-10-CM | POA: Diagnosis not present

## 2020-04-30 DIAGNOSIS — M0589 Other rheumatoid arthritis with rheumatoid factor of multiple sites: Secondary | ICD-10-CM | POA: Diagnosis not present

## 2020-04-30 DIAGNOSIS — M2550 Pain in unspecified joint: Secondary | ICD-10-CM | POA: Diagnosis not present

## 2020-04-30 DIAGNOSIS — M25462 Effusion, left knee: Secondary | ICD-10-CM | POA: Diagnosis not present

## 2020-04-30 DIAGNOSIS — M1120 Other chondrocalcinosis, unspecified site: Secondary | ICD-10-CM | POA: Diagnosis not present

## 2020-06-01 DIAGNOSIS — R05 Cough: Secondary | ICD-10-CM | POA: Diagnosis not present

## 2020-06-01 DIAGNOSIS — Z20822 Contact with and (suspected) exposure to covid-19: Secondary | ICD-10-CM | POA: Diagnosis not present

## 2020-06-01 DIAGNOSIS — R509 Fever, unspecified: Secondary | ICD-10-CM | POA: Diagnosis not present

## 2020-06-01 DIAGNOSIS — R519 Headache, unspecified: Secondary | ICD-10-CM | POA: Diagnosis not present

## 2020-06-16 ENCOUNTER — Other Ambulatory Visit: Payer: Self-pay | Admitting: *Deleted

## 2020-06-16 DIAGNOSIS — J22 Unspecified acute lower respiratory infection: Secondary | ICD-10-CM

## 2020-06-16 DIAGNOSIS — E039 Hypothyroidism, unspecified: Secondary | ICD-10-CM

## 2020-06-16 MED ORDER — LEVOTHYROXINE SODIUM 88 MCG PO TABS: 88.0000 ug | ORAL_TABLET | Freq: Every day | ORAL | 0 refills | Status: DC

## 2020-06-16 MED ORDER — ALBUTEROL SULFATE HFA 108 (90 BASE) MCG/ACT IN AERS: INHALATION_SPRAY | RESPIRATORY_TRACT | 3 refills | Status: DC

## 2020-07-06 ENCOUNTER — Other Ambulatory Visit: Payer: Self-pay | Admitting: Family Medicine

## 2020-07-06 DIAGNOSIS — K219 Gastro-esophageal reflux disease without esophagitis: Secondary | ICD-10-CM

## 2020-07-31 DIAGNOSIS — M0589 Other rheumatoid arthritis with rheumatoid factor of multiple sites: Secondary | ICD-10-CM | POA: Diagnosis not present

## 2020-07-31 DIAGNOSIS — M2550 Pain in unspecified joint: Secondary | ICD-10-CM | POA: Diagnosis not present

## 2020-07-31 DIAGNOSIS — M542 Cervicalgia: Secondary | ICD-10-CM | POA: Diagnosis not present

## 2020-07-31 DIAGNOSIS — M150 Primary generalized (osteo)arthritis: Secondary | ICD-10-CM | POA: Diagnosis not present

## 2020-08-07 DIAGNOSIS — M542 Cervicalgia: Secondary | ICD-10-CM | POA: Diagnosis not present

## 2020-10-23 ENCOUNTER — Other Ambulatory Visit: Payer: Self-pay | Admitting: Family Medicine

## 2020-10-23 DIAGNOSIS — F321 Major depressive disorder, single episode, moderate: Secondary | ICD-10-CM

## 2020-10-24 NOTE — Telephone Encounter (Signed)
Please call pt and have her to schedule a f/u for her medication refills of Trazodone and Cymbalta. She is OVERDUE. Thanks!!

## 2020-10-27 NOTE — Telephone Encounter (Signed)
Appt scheduled for 11/03/20. AM

## 2020-11-03 ENCOUNTER — Ambulatory Visit (INDEPENDENT_AMBULATORY_CARE_PROVIDER_SITE_OTHER): Payer: BC Managed Care – PPO | Admitting: Family Medicine

## 2020-11-03 ENCOUNTER — Other Ambulatory Visit: Payer: Self-pay

## 2020-11-03 ENCOUNTER — Encounter: Payer: Self-pay | Admitting: Family Medicine

## 2020-11-03 VITALS — BP 123/76 | HR 101 | Ht 65.0 in | Wt 210.0 lb

## 2020-11-03 DIAGNOSIS — N912 Amenorrhea, unspecified: Secondary | ICD-10-CM | POA: Diagnosis not present

## 2020-11-03 DIAGNOSIS — F321 Major depressive disorder, single episode, moderate: Secondary | ICD-10-CM

## 2020-11-03 DIAGNOSIS — G4700 Insomnia, unspecified: Secondary | ICD-10-CM

## 2020-11-03 DIAGNOSIS — Z1231 Encounter for screening mammogram for malignant neoplasm of breast: Secondary | ICD-10-CM

## 2020-11-03 DIAGNOSIS — E039 Hypothyroidism, unspecified: Secondary | ICD-10-CM

## 2020-11-03 MED ORDER — DULOXETINE HCL 30 MG PO CPEP: 60.0000 mg | ORAL_CAPSULE | Freq: Every day | ORAL | 0 refills | Status: DC

## 2020-11-03 MED ORDER — TRAZODONE HCL 150 MG PO TABS
150.0000 mg | ORAL_TABLET | Freq: Every day | ORAL | 0 refills | Status: DC
Start: 2020-11-03 — End: 2021-05-08

## 2020-11-03 NOTE — Progress Notes (Signed)
Established Patient Office Visit  Subjective:  Patient ID: Nancy Wang, female    DOB: 23-Feb-1971  Age: 50 y.o. MRN: [id number]  CC:  Chief Complaint  Patient presents with  . Insomnia    HPI Nancy Wang presents for follow-up depression.  She is currently on Cymbalta 60 mg daily.  PHQ-9 score of 11 and GAD-7 score of 5.  Rates her symptoms as somewhat difficult.  In regards to mood she feels like overall she is doing okay but she and her husband have separated and they will be divorcing.  She is not currently undergoing any therapy or counseling but says she is actually planning on putting her son into counseling. He is 12.   Follow-up insomnia-uses trazodone as needed.  Though recently she has been taking it almost every night in fact she ran out and could not take it for a few nights and really did not sleep well.  Hypothyroidism - Taking medication regularly in the AM away from food and vitamins, etc. No recent change to skin, hair, or energy levels.  She also has not had her period in the last 6 months and wants to know she could be fully postmenopausal.  Past Medical History:  Diagnosis Date  . Bronchitis   . GERD (gastroesophageal reflux disease)   . Pseudogout    Dr. Ouida Sills  . RA (rheumatoid arthritis) ([place])    Follow by Tobie Lords, MD - [facility]  . Thyroid disease    hypothyroidism    Past Surgical History:  Procedure Laterality Date  . ABDOMINAL SURGERY    . arthroscopic surgery     both knees, RT 1985 removed patella, LT 1988  . ESSURE TUBAL LIGATION    . exploratory lap  1985, 2004   for ovarian cysts, infertility    Family History  Problem Relation Age of Onset  . Cancer Father        prostate  . Coronary artery disease Father   . Depression Father   . Hypertension Mother   . Diabetes Mother   . Diabetes Unknown        family history  . Hypertension Unknown        family history    Social History    Socioeconomic History  . Marital status: Married    Spouse name: Not on file  . Number of children: Not on file  . Years of education: Not on file  . Highest education level: Not on file  Occupational History  . Not on file  Tobacco Use  . Smoking status: Never Smoker  . Smokeless tobacco: Never Used  Vaping Use  . Vaping Use: Never used  Substance and Sexual Activity  . Alcohol use: Yes  . Drug use: No  . Sexual activity: Not on file    Comment: married, no regular exercise, limited by knee pain, has son.   Other Topics Concern  . Not on file  Social History Narrative  . Not on file   Social Determinants of Health   Financial Resource Strain: Not on file  Food Insecurity: Not on file  Transportation Needs: Not on file  Physical Activity: Not on file  Stress: Not on file  Social Connections: Not on file  Intimate Partner Violence: Not on file    Outpatient Medications Prior to Visit  Medication Sig Dispense Refill  . albuterol (VENTOLIN HFA) 108 (90 Base) MCG/ACT inhaler INHALE 1-2 PUFFS INTO THE LUNGS EVERY 6 HOURS AS NEEDED FOR  BRONCHOSPASM 18 g 3  . COLCRYS 0.6 MG tablet Take 0.6 mg by mouth daily.  2  . folic acid (FOLVITE) 1 MG tablet     . ibuprofen (ADVIL) 800 MG tablet Take 1 tablet (800 mg total) by mouth every 8 (eight) hours as needed. 60 tablet 0  . levothyroxine (SYNTHROID) 88 MCG tablet Take 1 tablet (88 mcg total) by mouth daily before breakfast. 90 tablet 0  . methotrexate (RHEUMATREX) 2.5 MG tablet Take 2.5 mg by mouth once a week. Take 9 tablets by mouth once a week    . omeprazole (PRILOSEC) 20 MG capsule TAKE 1 CAPSULE DAILY 90 capsule 3  . RINVOQ 15 MG TB24 Take 1 tablet by mouth daily.    . DULoxetine (CYMBALTA) 30 MG capsule Take 1 capsule (30 mg total) by mouth daily. 30 capsule 0  . DULoxetine (CYMBALTA) 30 MG capsule TAKE 2 CAPSULES DAILY 180 capsule 0  . traZODone (DESYREL) 150 MG tablet TAKE 1 TABLET AT BEDTIME 90 tablet 0   No  facility-administered medications prior to visit.    Allergies  Allergen Reactions  . Citalopram Other (See Comments)    QT prolongation  . Morphine Sulfate     ROS Review of Systems    Objective:    Physical Exam Constitutional:      Appearance: She is well-developed and well-nourished.  HENT:     Head: Normocephalic and atraumatic.  Cardiovascular:     Rate and Rhythm: Normal rate and regular rhythm.     Heart sounds: Normal heart sounds.  Pulmonary:     Effort: Pulmonary effort is normal.     Breath sounds: Normal breath sounds.  Skin:    General: Skin is warm and dry.  Neurological:     Mental Status: She is alert and oriented to person, place, and time.  Psychiatric:        Mood and Affect: Mood and affect normal.        Behavior: Behavior normal.     BP 123/76   Pulse (!) 101   Ht 5\' 5"  (1.651 m)   Wt 210 lb (95.3 kg)   SpO2 98%   BMI 34.95 kg/m  Wt Readings from Last 3 Encounters:  11/03/20 210 lb (95.3 kg)  02/05/20 200 lb (90.7 kg)  08/14/19 192 lb (87.1 kg)     Health Maintenance Due  Topic Date Due  . Hepatitis C Screening  Never done  . HIV Screening  Never done  . COLONOSCOPY (Pts 45-28yrs Insurance coverage will need to be confirmed)  Never done    There are no preventive care reminders to display for this patient.  Lab Results  Component Value Date   TSH 4.90 (H) 07/19/2019   Lab Results  Component Value Date   WBC 9.5 07/10/2019   HGB 14.9 07/10/2019   HCT 42.2 02/05/2019   MCV 94.6 02/05/2019   PLT 269 07/10/2019   Lab Results  Component Value Date   NA 139 07/19/2019   K 4.7 07/19/2019   CO2 31 07/19/2019   GLUCOSE 102 (H) 07/19/2019   BUN 17 07/19/2019   CREATININE 0.92 07/19/2019   BILITOT 1.1 07/19/2019   ALKPHOS 75 07/10/2019   AST 18 07/19/2019   ALT 33 (H) 07/19/2019   PROT 6.9 07/19/2019   ALBUMIN 3.9 12/24/2013   CALCIUM 9.9 07/19/2019   Lab Results  Component Value Date   CHOL 233 (H) 07/19/2019    Lab Results  Component Value  Date   HDL 56 07/19/2019   Lab Results  Component Value Date   LDLCALC 147 (H) 07/19/2019   Lab Results  Component Value Date   TRIG 162 (H) 07/19/2019   Lab Results  Component Value Date   CHOLHDL 4.2 07/19/2019   Lab Results  Component Value Date   HGBA1C 5.4 05/16/2018      Assessment & Plan:   Problem List Items Addressed This Visit      Endocrine   Hypothyroidism    We will go ahead and recheck TSH and make any adjustments needed.      Relevant Orders   TSH + free T4     Other   Insomnia    Is in the trazodone a little more frequently.  We will go ahead and send refills to the pharmacy today.      Relevant Medications   traZODone (DESYREL) 150 MG tablet   Depression - Primary    Continue current regimen of 60 mg Cymbalta daily.  Plan to follow-up in 6 months.  She can always call sooner if she feels like we might need to make an adjustment.  Encouraged her to consider some type of therapy or counselin right now she is worried about the potential cost.  She could see if there are options through her husband's work, she is on his health insurance.  Or even looking into in the one of the mobile apps like talk space or headspace.      Relevant Medications   traZODone (DESYREL) 150 MG tablet   DULoxetine (CYMBALTA) 30 MG capsule    Other Visit Diagnoses    Amenorrhea       Relevant Orders   Progesterone   Estradiol   Follicle stimulating hormone   Luteinizing hormone   TSH + free T4   Screening mammogram, encounter for       Relevant Orders   MM 3D SCREEN BREAST BILATERAL     Amenorrhea x6 months-by definition usually have to be amenorrheic for 1 year to be postmenopausal but we can certainly go ahead and check hormone levels to see if she may already be going through a little bit of a premature menopause.  Also recheck her thyroid to make sure that that does not need to be adjusted and could be causing some issues with  her.   Meds ordered this encounter  Medications  . traZODone (DESYREL) 150 MG tablet    Sig: Take 1 tablet (150 mg total) by mouth at bedtime.    Dispense:  90 tablet    Refill:  0  . DULoxetine (CYMBALTA) 30 MG capsule    Sig: Take 2 capsules (60 mg total) by mouth daily.    Dispense:  180 capsule    Refill:  0    Follow-up: Return in about 6 months (around 05/03/2021) for sleep and mood medication .    Beatrice Lecher, MD

## 2020-11-03 NOTE — Assessment & Plan Note (Signed)
Is in the trazodone a little more frequently.  We will go ahead and send refills to the pharmacy today.

## 2020-11-03 NOTE — Assessment & Plan Note (Signed)
We will go ahead and recheck TSH and make any adjustments needed.

## 2020-11-03 NOTE — Assessment & Plan Note (Signed)
Continue current regimen of 60 mg Cymbalta daily.  Plan to follow-up in 6 months.  She can always call sooner if she feels like we might need to make an adjustment.  Encouraged her to consider some type of therapy or counselin right now she is worried about the potential cost.  She could see if there are options through her husband's work, she is on his health insurance.  Or even looking into in the one of the mobile apps like talk space or headspace.

## 2020-11-04 DIAGNOSIS — M0589 Other rheumatoid arthritis with rheumatoid factor of multiple sites: Secondary | ICD-10-CM | POA: Diagnosis not present

## 2020-11-04 DIAGNOSIS — M150 Primary generalized (osteo)arthritis: Secondary | ICD-10-CM | POA: Diagnosis not present

## 2020-11-04 DIAGNOSIS — M2550 Pain in unspecified joint: Secondary | ICD-10-CM | POA: Diagnosis not present

## 2020-11-04 DIAGNOSIS — N951 Menopausal and female climacteric states: Secondary | ICD-10-CM | POA: Diagnosis not present

## 2020-11-04 DIAGNOSIS — M542 Cervicalgia: Secondary | ICD-10-CM | POA: Diagnosis not present

## 2020-11-04 DIAGNOSIS — N912 Amenorrhea, unspecified: Secondary | ICD-10-CM | POA: Diagnosis not present

## 2020-11-04 DIAGNOSIS — E039 Hypothyroidism, unspecified: Secondary | ICD-10-CM | POA: Diagnosis not present

## 2020-11-04 LAB — BASIC METABOLIC PANEL
BUN: 15 (ref 4–21)
CO2: 27 — AB (ref 13–22)
Creatinine: 1 (ref 0.5–1.1)
Glucose: 98
Potassium: 5.2 (ref 3.4–5.3)
Sodium: 142 (ref 137–147)

## 2020-11-04 LAB — COMPREHENSIVE METABOLIC PANEL
Albumin: 4.3 (ref 3.5–5.0)
Calcium: 9.8 (ref 8.7–10.7)
Globulin: 3.1

## 2020-11-04 LAB — CBC: RBC: 4.18 (ref 3.87–5.11)

## 2020-11-04 LAB — HEPATIC FUNCTION PANEL
ALT: 21 (ref 7–35)
AST: 21 (ref 13–35)
Alkaline Phosphatase: 83 (ref 25–125)
Bilirubin, Total: 0.7

## 2020-11-04 LAB — CBC AND DIFFERENTIAL
HCT: 41 (ref 36–46)
Hemoglobin: 13.6 (ref 12.0–16.0)
Platelets: 327 (ref 150–399)
WBC: 11.2

## 2020-11-04 LAB — TSH: TSH: 3.48 (ref 0.41–5.90)

## 2020-11-11 ENCOUNTER — Other Ambulatory Visit: Payer: Self-pay | Admitting: Family Medicine

## 2020-11-11 DIAGNOSIS — F321 Major depressive disorder, single episode, moderate: Secondary | ICD-10-CM

## 2020-12-24 ENCOUNTER — Encounter: Payer: Self-pay | Admitting: Family Medicine

## 2021-03-02 ENCOUNTER — Other Ambulatory Visit: Payer: Self-pay | Admitting: Family Medicine

## 2021-03-02 DIAGNOSIS — M25461 Effusion, right knee: Secondary | ICD-10-CM | POA: Diagnosis not present

## 2021-03-02 DIAGNOSIS — M2550 Pain in unspecified joint: Secondary | ICD-10-CM | POA: Diagnosis not present

## 2021-03-02 DIAGNOSIS — M0589 Other rheumatoid arthritis with rheumatoid factor of multiple sites: Secondary | ICD-10-CM | POA: Diagnosis not present

## 2021-03-02 DIAGNOSIS — M150 Primary generalized (osteo)arthritis: Secondary | ICD-10-CM | POA: Diagnosis not present

## 2021-03-02 DIAGNOSIS — E039 Hypothyroidism, unspecified: Secondary | ICD-10-CM

## 2021-03-09 ENCOUNTER — Other Ambulatory Visit: Payer: Self-pay | Admitting: Family Medicine

## 2021-03-09 DIAGNOSIS — Z1231 Encounter for screening mammogram for malignant neoplasm of breast: Secondary | ICD-10-CM

## 2021-03-12 ENCOUNTER — Ambulatory Visit (INDEPENDENT_AMBULATORY_CARE_PROVIDER_SITE_OTHER): Payer: BC Managed Care – PPO

## 2021-03-12 ENCOUNTER — Other Ambulatory Visit: Payer: Self-pay

## 2021-03-12 DIAGNOSIS — Z1231 Encounter for screening mammogram for malignant neoplasm of breast: Secondary | ICD-10-CM | POA: Diagnosis not present

## 2021-05-08 ENCOUNTER — Other Ambulatory Visit: Payer: Self-pay | Admitting: Family Medicine

## 2021-05-08 DIAGNOSIS — G4700 Insomnia, unspecified: Secondary | ICD-10-CM

## 2021-05-08 NOTE — Telephone Encounter (Signed)
Called patient LVM to call and schedule an appt to get refill for RX.  - tvt

## 2021-05-08 NOTE — Telephone Encounter (Signed)
Please advise pt that she will need to schedule a f/u for medication refills, thanks

## 2021-05-30 ENCOUNTER — Other Ambulatory Visit: Payer: Self-pay | Admitting: Family Medicine

## 2021-05-30 DIAGNOSIS — G4700 Insomnia, unspecified: Secondary | ICD-10-CM

## 2021-06-02 NOTE — Telephone Encounter (Signed)
Please call pt for f/u on trazodone and fasting labs.

## 2021-06-03 NOTE — Telephone Encounter (Signed)
LVM for patient to call back to get this follow up appt scheduled with fasting labs. AM

## 2021-06-26 ENCOUNTER — Other Ambulatory Visit: Payer: Self-pay | Admitting: Family Medicine

## 2021-06-26 DIAGNOSIS — G4700 Insomnia, unspecified: Secondary | ICD-10-CM

## 2021-06-30 ENCOUNTER — Other Ambulatory Visit: Payer: Self-pay | Admitting: Family Medicine

## 2021-06-30 DIAGNOSIS — M0589 Other rheumatoid arthritis with rheumatoid factor of multiple sites: Secondary | ICD-10-CM | POA: Diagnosis not present

## 2021-06-30 DIAGNOSIS — G4700 Insomnia, unspecified: Secondary | ICD-10-CM

## 2021-06-30 NOTE — Telephone Encounter (Signed)
LVM informing pt of needing an appt for further refills.

## 2021-06-30 NOTE — Telephone Encounter (Signed)
Please call pt and advise her that she will need a f/u for her medication refills

## 2021-07-01 ENCOUNTER — Other Ambulatory Visit: Payer: Self-pay | Admitting: Family Medicine

## 2021-07-01 DIAGNOSIS — K219 Gastro-esophageal reflux disease without esophagitis: Secondary | ICD-10-CM

## 2021-07-09 ENCOUNTER — Ambulatory Visit (INDEPENDENT_AMBULATORY_CARE_PROVIDER_SITE_OTHER): Payer: BC Managed Care – PPO | Admitting: Family Medicine

## 2021-07-09 ENCOUNTER — Other Ambulatory Visit: Payer: Self-pay | Admitting: Family Medicine

## 2021-07-09 ENCOUNTER — Other Ambulatory Visit: Payer: Self-pay

## 2021-07-09 VITALS — BP 129/75 | HR 79 | Ht 65.0 in | Wt 217.0 lb

## 2021-07-09 DIAGNOSIS — E785 Hyperlipidemia, unspecified: Secondary | ICD-10-CM | POA: Diagnosis not present

## 2021-07-09 DIAGNOSIS — E039 Hypothyroidism, unspecified: Secondary | ICD-10-CM | POA: Diagnosis not present

## 2021-07-09 DIAGNOSIS — F321 Major depressive disorder, single episode, moderate: Secondary | ICD-10-CM

## 2021-07-09 DIAGNOSIS — Z23 Encounter for immunization: Secondary | ICD-10-CM | POA: Diagnosis not present

## 2021-07-09 DIAGNOSIS — R058 Other specified cough: Secondary | ICD-10-CM

## 2021-07-09 DIAGNOSIS — Z1211 Encounter for screening for malignant neoplasm of colon: Secondary | ICD-10-CM

## 2021-07-09 DIAGNOSIS — L989 Disorder of the skin and subcutaneous tissue, unspecified: Secondary | ICD-10-CM

## 2021-07-09 MED ORDER — DULOXETINE HCL 30 MG PO CPEP: 60.0000 mg | ORAL_CAPSULE | Freq: Every day | ORAL | 1 refills | Status: DC
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
## 2021-07-10 NOTE — Assessment & Plan Note (Signed)
If cough continues please let us know she is actually on steroid for her gout but it should also help with her breathing.  She also has an albuterol inhaler.

## 2021-07-10 NOTE — Assessment & Plan Note (Signed)
Last LDL was 132.  Due to recheck lipids not currently on prescription medication.

## 2021-07-10 NOTE — Assessment & Plan Note (Signed)
Due to recheck TSH. 

## 2021-07-10 NOTE — Assessment & Plan Note (Signed)
Stable on current regimen.  Plan to follow back up in 6 months.

## 2021-07-22 ENCOUNTER — Other Ambulatory Visit: Payer: Self-pay | Admitting: Family Medicine

## 2021-07-22 DIAGNOSIS — G4700 Insomnia, unspecified: Secondary | ICD-10-CM

## 2021-07-25 DIAGNOSIS — Z1211 Encounter for screening for malignant neoplasm of colon: Secondary | ICD-10-CM | POA: Diagnosis not present

## 2021-07-30 DIAGNOSIS — L578 Other skin changes due to chronic exposure to nonionizing radiation: Secondary | ICD-10-CM | POA: Diagnosis not present

## 2021-07-30 DIAGNOSIS — L814 Other melanin hyperpigmentation: Secondary | ICD-10-CM | POA: Diagnosis not present

## 2021-07-30 DIAGNOSIS — L570 Actinic keratosis: Secondary | ICD-10-CM | POA: Diagnosis not present

## 2021-07-30 DIAGNOSIS — C44319 Basal cell carcinoma of skin of other parts of face: Secondary | ICD-10-CM | POA: Diagnosis not present

## 2021-08-01 LAB — COLOGUARD: COLOGUARD: POSITIVE — AB

## 2021-08-03 ENCOUNTER — Encounter: Payer: Self-pay | Admitting: Family Medicine

## 2021-08-03 DIAGNOSIS — R195 Other fecal abnormalities: Secondary | ICD-10-CM

## 2021-08-03 NOTE — Progress Notes (Signed)
Hi Nancy Wang, your Cologuard came back positive meaning that there is enough concern that you do need a full colonoscopy for further evaluation for colon cancer screening.  I would like to place a referral, so please let us know if you have a preferred provider or location.

## 2021-08-19 ENCOUNTER — Telehealth: Payer: BC Managed Care – PPO | Admitting: Physician Assistant

## 2021-08-19 DIAGNOSIS — H109 Unspecified conjunctivitis: Secondary | ICD-10-CM

## 2021-08-19 DIAGNOSIS — J069 Acute upper respiratory infection, unspecified: Secondary | ICD-10-CM

## 2021-08-19 MED ORDER — POLYMYXIN B-TRIMETHOPRIM 10000-0.1 UNIT/ML-% OP SOLN: 1.0000 [drp] | OPHTHALMIC | 0 refills | Status: DC

## 2021-08-19 MED ORDER — IPRATROPIUM BROMIDE 0.03 % NA SOLN: 2.0000 | Freq: Two times a day (BID) | NASAL | 0 refills | Status: DC

## 2021-08-19 MED ORDER — BENZONATATE 100 MG PO CAPS
100.0000 mg | ORAL_CAPSULE | Freq: Three times a day (TID) | ORAL | 0 refills | Status: DC | PRN
Start: 2021-08-19 — End: 2021-10-19

## 2021-08-19 NOTE — Progress Notes (Signed)
E-Visit for [employer]   We are sorry that you are not feeling well.  Here is how we plan to help!  Based on what you have shared with me it looks like you have conjunctivitis.  Conjunctivitis is a common inflammatory or infectious condition of the eye that is often referred to as "pink eye".  In most cases it is contagious (viral or bacterial). However, not all conjunctivitis requires antibiotics (ex. Allergic).  We have made appropriate suggestions for you based upon your presentation.  I have prescribed Polytrim Ophthalmic drops 1-2 drops 4 times a day times 5 days  Pink eye can be highly contagious.  It is typically spread through direct contact with secretions, or contaminated objects or surfaces that one may have touched.  Strict handwashing is suggested with soap and water is urged.  If not available, use alcohol based had sanitizer.  Avoid unnecessary touching of the eye.  If you wear contact lenses, you will need to refrain from wearing them until you see no white discharge from the eye for at least 24 hours after being on medication.  You should see symptom improvement in 1-2 days after starting the medication regimen.  Call us if symptoms are not improved in 1-2 days.  Home Care: Wash your hands often! Do not wear your contacts until you complete your treatment plan. Avoid sharing towels, bed linen, personal items with a person who has pink eye. See attention for anyone in your home with similar symptoms.  Get Help Right Away If: Your symptoms do not improve. You develop blurred or loss of vision. Your symptoms worsen (increased discharge, pain or redness)  E-Visit for Upper Respiratory Infection   We are sorry you are not feeling well.  Here is how we plan to help!  Based on what you have shared with me, it looks like you may have a viral upper respiratory infection.  Upper respiratory infections are caused by a large number of viruses; however, rhinovirus is the most common  cause.   Symptoms vary from person to person, with common symptoms including sore throat, cough, fatigue or lack of energy and feeling of general discomfort.  A low-grade fever of up to 100.4 may present, but is often uncommon.  Symptoms vary however, and are closely related to a person's age or underlying illnesses.  The most common symptoms associated with an upper respiratory infection are nasal discharge or congestion, cough, sneezing, headache and pressure in the ears and face.  These symptoms usually persist for about 3 to 10 days, but can last up to 2 weeks.  It is important to know that upper respiratory infections do not cause serious illness or complications in most cases.    Upper respiratory infections can be transmitted from person to person, with the most common method of transmission being a person's hands.  The virus is able to live on the skin and can infect other persons for up to 2 hours after direct contact.  Also, these can be transmitted when someone coughs or sneezes; thus, it is important to cover the mouth to reduce this risk.  To keep the spread of the illness at [place], good hand hygiene is very important.  This is an infection that is most likely caused by a virus. There are no specific treatments other than to help you with the symptoms until the infection runs its course.  We are sorry you are not feeling well.  Here is how we plan to help!  For nasal congestion, you may use an oral decongestants such as Mucinex D or if you have glaucoma or high blood pressure use plain Mucinex.  Saline nasal spray or nasal drops can help and can safely be used as often as needed for congestion.  For your congestion, I have prescribed Ipratropium Bromide nasal spray 0.03% two sprays in each nostril 2-3 times a day  If you do not have a history of heart disease, hypertension, diabetes or thyroid disease, prostate/bladder issues or glaucoma, you may also use Sudafed to treat nasal congestion.  It  is highly recommended that you consult with a pharmacist or your primary care physician to ensure this medication is safe for you to take.     If you have a cough, you may use cough suppressants such as Delsym and Robitussin.  If you have glaucoma or high blood pressure, you can also use Coricidin HBP.   For cough I have prescribed for you A prescription cough medication called Tessalon Perles 100 mg. You may take 1-2 capsules every 8 hours as needed for cough  If you have a sore or scratchy throat, use a saltwater gargle-  to  teaspoon of salt dissolved in a 4-ounce to 8-ounce glass of warm water.  Gargle the solution for approximately 15-30 seconds and then spit.  It is important not to swallow the solution.  You can also use throat lozenges/cough drops and Chloraseptic spray to help with throat pain or discomfort.  Warm or cold liquids can also be helpful in relieving throat pain.  For headache, pain or general discomfort, you can use Ibuprofen or Tylenol as directed.   Some authorities believe that zinc sprays or the use of Echinacea may shorten the course of your symptoms.   HOME CARE Only take medications as instructed by your medical team. Be sure to drink plenty of fluids. Water is fine as well as fruit juices, sodas and electrolyte beverages. You may want to stay away from caffeine or alcohol. If you are nauseated, try taking small sips of liquids. How do you know if you are getting enough fluid? Your urine should be a pale yellow or almost colorless. Get rest. Taking a steamy shower or using a humidifier may help nasal congestion and ease sore throat pain. You can place a towel over your head and breathe in the steam from hot water coming from a faucet. Using a saline nasal spray works much the same way. Cough drops, hard candies and sore throat lozenges may ease your cough. Avoid close contacts especially the very young and the elderly Cover your mouth if you cough or sneeze Always  remember to wash your hands.   GET HELP RIGHT AWAY IF: You develop worsening fever. If your symptoms do not improve within 10 days You develop yellow or green discharge from your nose over 3 days. You have coughing fits You develop a severe head ache or visual changes. You develop shortness of breath, difficulty breathing or start having chest pain Your symptoms persist after you have completed your treatment plan  MAKE SURE YOU  Understand these instructions. Will watch your condition. Will get help right away if you are not doing well or get worse.  Thank you for choosing an e-visit.  Your e-visit answers were reviewed by a board certified advanced clinical practitioner to complete your personal care plan. Depending upon the condition, your plan could have included both over the counter or prescription medications.  Please review your pharmacy choice.  Make sure the pharmacy is open so you can pick up prescription now. If there is a problem, you may contact your provider through [employer] and have the prescription routed to another pharmacy.  Your safety is important to [place]. If you have drug allergies check your prescription carefully.   For the next 24 hours you can use MyChart to ask questions about today's visit, request a non-urgent call back, or ask for a work or school excuse. You will get an email in the next two days asking about your experience. I hope that your e-visit has been valuable and will speed your recovery.  Thank you for choosing an e-visit.  Your e-visit answers were reviewed by a board certified advanced clinical practitioner to complete your personal care plan. Depending upon the condition, your plan could have included both over the counter or prescription medications.  Please review your pharmacy choice. Make sure the pharmacy is open so you can pick up prescription now. If there is a problem, you may contact your provider through [employer] and  have the prescription routed to another pharmacy.  Your safety is important to [place]. If you have drug allergies check your prescription carefully.   For the next 24 hours you can use MyChart to ask questions about today's visit, request a non-urgent call back, or ask for a work or school excuse. You will get an email in the next two days asking about your experience. I hope that your e-visit has been valuable and will speed your recovery.  I provided 7 minutes of non face-to-face time during this encounter for chart review and documentation.

## 2021-08-27 DIAGNOSIS — C4402 Squamous cell carcinoma of skin of lip: Secondary | ICD-10-CM | POA: Diagnosis not present

## 2021-09-16 DIAGNOSIS — Z1211 Encounter for screening for malignant neoplasm of colon: Secondary | ICD-10-CM | POA: Diagnosis not present

## 2021-09-16 DIAGNOSIS — K648 Other hemorrhoids: Secondary | ICD-10-CM | POA: Diagnosis not present

## 2021-09-16 DIAGNOSIS — F32A Depression, unspecified: Secondary | ICD-10-CM | POA: Diagnosis not present

## 2021-09-16 DIAGNOSIS — D120 Benign neoplasm of cecum: Secondary | ICD-10-CM | POA: Diagnosis not present

## 2021-09-16 DIAGNOSIS — R195 Other fecal abnormalities: Secondary | ICD-10-CM | POA: Diagnosis not present

## 2021-09-16 DIAGNOSIS — D123 Benign neoplasm of transverse colon: Secondary | ICD-10-CM | POA: Diagnosis not present

## 2021-09-16 LAB — HM COLONOSCOPY

## 2021-10-19 ENCOUNTER — Other Ambulatory Visit: Payer: Self-pay

## 2021-10-19 ENCOUNTER — Ambulatory Visit (INDEPENDENT_AMBULATORY_CARE_PROVIDER_SITE_OTHER): Payer: BC Managed Care – PPO | Admitting: Family Medicine

## 2021-10-19 ENCOUNTER — Encounter: Payer: Self-pay | Admitting: Family Medicine

## 2021-10-19 VITALS — BP 129/91 | HR 109 | Resp 18 | Ht 65.0 in | Wt 216.0 lb

## 2021-10-19 DIAGNOSIS — Z8616 Personal history of COVID-19: Secondary | ICD-10-CM | POA: Diagnosis not present

## 2021-10-19 DIAGNOSIS — E039 Hypothyroidism, unspecified: Secondary | ICD-10-CM | POA: Diagnosis not present

## 2021-10-19 DIAGNOSIS — R000 Tachycardia, unspecified: Secondary | ICD-10-CM

## 2021-10-19 DIAGNOSIS — Z23 Encounter for immunization: Secondary | ICD-10-CM

## 2021-10-19 NOTE — Progress Notes (Signed)
Patient states Colonoscopy was done at [facility]) in 09/18/21. Release form faxed.

## 2021-10-19 NOTE — Progress Notes (Signed)
Acute Office Visit  Subjective:    Patient ID: Nancy Wang, female    DOB: 07/26/1971, 51 y.o.   MRN: [id number]  Chief Complaint  Patient presents with   elevated heartrate     1 week. Patient states she has been breathing heavier and jittery on short walks. Patient states heartrate has been around 90-100. Patient states heartrate increased to 168 this morning while making her son lunch.     HPI Patient is in today for elevated heart rate x1 week.  She notices that she is breathing a little heavier and feels jittery when taking just short walks.  Patient states that her heart rate has been running around 90-100.  And it went up to 168 this morning while making her son lunch.  She says now just sitting here her pulse is a little over 100.  She says she has not increased her caffeine intake she has not had any medication changes no new thyroid dose or generic given.  The only thing is that she did have COVID around January 4.  She said it was mostly mild upper respiratory symptoms but ever since she has been having heart racing and palpitations.  No blood in the urine or stool.  Her only residual symptom from COVID really is just feeling tired.  Past Medical History:  Diagnosis Date   Bronchitis    GERD (gastroesophageal reflux disease)    Pseudogout    Dr. Ouida Sills   RA (rheumatoid arthritis) ([place])    Follow by Tobie Lords, MD - [facility]   Thyroid disease    hypothyroidism    Past Surgical History:  Procedure Laterality Date   ABDOMINAL SURGERY     arthroscopic surgery     both knees, RT 1985 removed patella, LT 1988   ESSURE TUBAL LIGATION     exploratory lap  1985, 2004   for ovarian cysts, infertility    Family History  Problem Relation Age of Onset   Hypertension Mother    Diabetes Mother    Cancer Father        prostate   Coronary artery disease Father    Depression Father    Breast cancer Cousin    Diabetes Other         family history   Hypertension Other        family history    Social History   Socioeconomic History   Marital status: Married    Spouse name: Not on file   Number of children: Not on file   Years of education: Not on file   Highest education level: Not on file  Occupational History   Not on file  Tobacco Use   Smoking status: Never   Smokeless tobacco: Never  Vaping Use   Vaping Use: Never used  Substance and Sexual Activity   Alcohol use: Yes   Drug use: No   Sexual activity: Not on file    Comment: married, no regular exercise, limited by knee pain, has son.   Other Topics Concern   Not on file  Social History Narrative   Not on file   Social Determinants of Health   Financial Resource Strain: Not on file  Food Insecurity: Not on file  Transportation Needs: Not on file  Physical Activity: Not on file  Stress: Not on file  Social Connections: Not on file  Intimate Partner Violence: Not on file    Outpatient Medications Prior to Visit  Medication Sig Dispense  Refill   albuterol (VENTOLIN HFA) 108 (90 Base) MCG/ACT inhaler INHALE 1-2 PUFFS INTO THE LUNGS EVERY 6 HOURS AS NEEDED FOR BRONCHOSPASM 18 g 3   COLCRYS 0.6 MG tablet Take 0.6 mg by mouth daily.  2   DULoxetine (CYMBALTA) 60 MG capsule Take 1 capsule (60 mg total) by mouth daily. 90 capsule 1   folic acid (FOLVITE) 1 MG tablet      levothyroxine (SYNTHROID) 88 MCG tablet TAKE 1 TABLET DAILY BEFORE BREAKFAST 90 tablet 3   methotrexate (RHEUMATREX) 2.5 MG tablet Take 2.5 mg by mouth once a week. Take 9 tablets by mouth once a week     omeprazole (PRILOSEC) 20 MG capsule TAKE 1 CAPSULE DAILY 90 capsule 3   RINVOQ 15 MG TB24 Take 1 tablet by mouth daily.     traZODone (DESYREL) 150 MG tablet Take 1 tablet (150 mg total) by mouth at bedtime. 90 tablet 1   benzonatate (TESSALON) 100 MG capsule Take 1 capsule (100 mg total) by mouth 3 (three) times daily as needed. 30 capsule 0    ipratropium (ATROVENT) 0.03 % nasal spray Place 2 sprays into both nostrils every 12 (twelve) hours. 30 mL 0   predniSONE (STERAPRED UNI-PAK 48 TAB) 5 MG (48) TBPK tablet Take by mouth as directed.     trimethoprim-polymyxin b (POLYTRIM) ophthalmic solution Place 1 drop into the right eye every 4 (four) hours. 10 mL 0   No facility-administered medications prior to visit.    Allergies  Allergen Reactions   Citalopram Other (See Comments)    QT prolongation   Morphine Sulfate     Review of Systems     Objective:    Physical Exam Constitutional:      Appearance: She is well-developed.  HENT:     Head: Normocephalic and atraumatic.     Right Ear: External ear normal.     Left Ear: External ear normal.     Nose: Nose normal.  Eyes:     Conjunctiva/sclera: Conjunctivae normal.     Pupils: Pupils are equal, round, and reactive to light.  Neck:     Thyroid: No thyromegaly.  Cardiovascular:     Rate and Rhythm: Normal rate and regular rhythm.     Heart sounds: Normal heart sounds.  Pulmonary:     Effort: Pulmonary effort is normal.     Breath sounds: Normal breath sounds. No wheezing.  Musculoskeletal:     Cervical back: Neck supple.  Lymphadenopathy:     Cervical: No cervical adenopathy.  Skin:    General: Skin is warm and dry.  Neurological:     Mental Status: She is alert and oriented to person, place, and time.    BP (!) 129/91    Pulse (!) 109    Resp 18    Ht 5\' 5"  (1.651 m)    Wt 216 lb (98 kg)    SpO2 97%    BMI 35.94 kg/m  Wt Readings from Last 3 Encounters:  10/19/21 216 lb (98 kg)  07/09/21 217 lb (98.4 kg)  11/03/20 210 lb (95.3 kg)    Health Maintenance Due  Topic Date Due   HIV Screening  Never done   Hepatitis C Screening  Never done   Zoster Vaccines- Shingrix (1 of 2) Never done   COLONOSCOPY (Pts 45-37yrs Insurance coverage will need to be confirmed)  Never done    There are no preventive care reminders to display for this  patient.   Lab  Results  Component Value Date   TSH 3.48 11/04/2020   Lab Results  Component Value Date   WBC 11.2 11/04/2020   HGB 13.6 11/04/2020   HCT 41 11/04/2020   MCV 94.6 02/05/2019   PLT 327 11/04/2020   Lab Results  Component Value Date   NA 142 11/04/2020   K 5.2 11/04/2020   CO2 27 (A) 11/04/2020   GLUCOSE 102 (H) 07/19/2019   BUN 15 11/04/2020   CREATININE 1.0 11/04/2020   BILITOT 1.1 07/19/2019   ALKPHOS 83 11/04/2020   AST 21 11/04/2020   ALT 21 11/04/2020   PROT 6.9 07/19/2019   ALBUMIN 4.3 11/04/2020   CALCIUM 9.8 11/04/2020   Lab Results  Component Value Date   CHOL 233 (H) 07/19/2019   Lab Results  Component Value Date   HDL 56 07/19/2019   Lab Results  Component Value Date   LDLCALC 147 (H) 07/19/2019   Lab Results  Component Value Date   TRIG 162 (H) 07/19/2019   Lab Results  Component Value Date   CHOLHDL 4.2 07/19/2019   Lab Results  Component Value Date   HGBA1C 5.4 05/16/2018       Assessment & Plan:   Problem List Items Addressed This Visit       Endocrine   Hypothyroidism    Last checked almost a year ago.  Due to recheck TSH today.  I want a rule out this is a potential cause for her recent tachycardia.      Relevant Orders   TSH + free T4     Other   History of COVID-19   Other Visit Diagnoses     Need for Zostavax administration    -  Primary   Tachycardia       Relevant Orders   CBC with Differential/Platelet   COMPLETE METABOLIC PANEL WITH GFR   TSH + free T4   EKG 12-Lead      Tachycardia-most likely secondary to COVID-19 but get some blood work just to rule out electrolyte disturbance, thyroid abnormality, anemia, etc.  EKG today shows rate of 98 bpm, normal sinus rhythm with no acute ST-T wave changes.  Worrisome findings.  We could consider a trial of addition of beta-blocker at least temporarily did discuss that most of the time of tachycardia secondary to COVID it is not permanent.  And usually  can be managed with a beta-blocker.  Again I want a wait till we get the blood work back to be sure that we do not need to make any other adjustments.  No orders of the defined types were placed in this encounter.    Beatrice Lecher, MD

## 2021-10-19 NOTE — Assessment & Plan Note (Signed)
Last checked almost a year ago.  Due to recheck TSH today.  I want a rule out this is a potential cause for her recent tachycardia.

## 2021-10-20 LAB — COMPLETE METABOLIC PANEL WITH GFR
AG Ratio: 1.3 (calc) (ref 1.0–2.5)
ALT: 36 U/L — ABNORMAL HIGH (ref 6–29)
AST: 22 U/L (ref 10–35)
Albumin: 4.2 g/dL (ref 3.6–5.1)
Alkaline phosphatase (APISO): 83 U/L (ref 37–153)
BUN: 13 mg/dL (ref 7–25)
CO2: 31 mmol/L (ref 20–32)
Calcium: 9.6 mg/dL (ref 8.6–10.4)
Chloride: 98 mmol/L (ref 98–110)
Creat: 1.01 mg/dL (ref 0.50–1.03)
Globulin: 3.2 g/dL (calc) (ref 1.9–3.7)
Glucose, Bld: 95 mg/dL (ref 65–99)
Potassium: 4.4 mmol/L (ref 3.5–5.3)
Sodium: 137 mmol/L (ref 135–146)
Total Bilirubin: 0.8 mg/dL (ref 0.2–1.2)
Total Protein: 7.4 g/dL (ref 6.1–8.1)
eGFR: 68 mL/min/{1.73_m2} (ref >=60)

## 2021-10-20 LAB — CBC WITH DIFFERENTIAL/PLATELET
Absolute Monocytes: 735 cells/uL (ref 200–950)
Basophils Absolute: 59 cells/uL (ref 0–200)
Basophils Relative: 0.6 %
Eosinophils Absolute: 176 cells/uL (ref 15–500)
Eosinophils Relative: 1.8 %
HCT: 42.8 % (ref 35.0–45.0)
Hemoglobin: 14.6 g/dL (ref 11.7–15.5)
Lymphs Abs: 3087 cells/uL (ref 850–3900)
MCH: 32 pg (ref 27.0–33.0)
MCHC: 34.1 g/dL (ref 32.0–36.0)
MCV: 93.9 fL (ref 80.0–100.0)
MPV: 10 fL (ref 7.5–12.5)
Monocytes Relative: 7.5 %
Neutro Abs: 5743 cells/uL (ref 1500–7800)
Neutrophils Relative %: 58.6 %
Platelets: 376 10*3/uL (ref 140–400)
RBC: 4.56 10*6/uL (ref 3.80–5.10)
RDW: 12.4 % (ref 11.0–15.0)
Total Lymphocyte: 31.5 %
WBC: 9.8 10*3/uL (ref 3.8–10.8)

## 2021-10-20 LAB — TSH+FREE T4: TSH W/REFLEX TO FT4: 4.49 mIU/L

## 2021-10-21 NOTE — Progress Notes (Signed)
Hi Nancy Wang, the ALT liver enzyme is still mildly elevated, similar to about 2 years ago.  We will just continue to keep an eye on it and plan to recheck again in 6 months.  The other in the liver enzyme is normal.  Your blood count looks great no sign of anemia causing the tachycardia.  Your TSH is a little on the higher end.  I would recommend a slight adjustment to your thyroid medication.  Please just verify how you are taking it.  And if you missed any doses recently.  We can make a slight adjustment and then recheck your level again in about 6 to 8 weeks.

## 2021-10-22 ENCOUNTER — Encounter: Payer: Self-pay | Admitting: Family Medicine

## 2021-10-23 ENCOUNTER — Encounter: Payer: Self-pay | Admitting: Family Medicine

## 2021-10-23 NOTE — Progress Notes (Signed)
Abstracted colonoscopy. 

## 2021-10-29 ENCOUNTER — Telehealth: Payer: BC Managed Care – PPO | Admitting: Physician Assistant

## 2021-10-29 DIAGNOSIS — J029 Acute pharyngitis, unspecified: Secondary | ICD-10-CM

## 2021-10-29 MED ORDER — AMOXICILLIN 500 MG PO TABS: 500.0000 mg | ORAL_TABLET | Freq: Two times a day (BID) | ORAL | 0 refills | Status: DC

## 2021-10-29 NOTE — Progress Notes (Signed)
°  E-Visit for Sore Throat  We are sorry that you are not feeling well.  Here is how we plan to help!  Your symptoms indicate a likely viral infection (Pharyngitis).   Pharyngitis is inflammation in the back of the throat which can cause a sore throat, scratchiness and sometimes difficulty swallowing.   Pharyngitis is typically caused by a respiratory virus and will just run its course.  Please keep in mind that your symptoms could last up to 10 days.  For throat pain, we recommend over the counter oral pain relief medications such as acetaminophen or aspirin, or anti-inflammatory medications such as ibuprofen or naproxen sodium.  Topical treatments such as oral throat lozenges or sprays may be used as needed.  Avoid close contact with loved ones, especially the very young and elderly.  Remember to wash your hands thoroughly throughout the day as this is the number one way to prevent the spread of infection and wipe down door knobs and counters with disinfectant.  In absence of fever, swollen tonsils and pus on back of throat, and giving cough -- strep is less likely so I do recommend you start with recommendations above. I will put an antibiotic on file at your pharmacy giving exposure, for you to start if you note fever, worsening throat, white patches on back of throat. I have sent in Amoxicillin 500 mg to take twice daily for 10 days in this instance.   Home Care: Only take medications as instructed by your medical team. Do not drink alcohol while taking these medications. A steam or ultrasonic humidifier can help congestion.  You can place a towel over your head and breathe in the steam from hot water coming from a faucet. Avoid close contacts especially the very young and the elderly. Cover your mouth when you cough or sneeze. Always remember to wash your hands.  Get Help Right Away If: You develop worsening fever or throat pain. You develop a severe head ache or visual changes. Your symptoms  persist after you have completed your treatment plan.  Make sure you Understand these instructions. Will watch your condition. Will get help right away if you are not doing well or get worse.   Thank you for choosing an e-visit.  Your e-visit answers were reviewed by a board certified advanced clinical practitioner to complete your personal care plan. Depending upon the condition, your plan could have included both over the counter or prescription medications.  Please review your pharmacy choice. Make sure the pharmacy is open so you can pick up prescription now. If there is a problem, you may contact your provider through [employer] and have the prescription routed to another pharmacy.  Your safety is important to [place]. If you have drug allergies check your prescription carefully.   For the next 24 hours you can use MyChart to ask questions about today's visit, request a non-urgent call back, or ask for a work or school excuse. You will get an email in the next two days asking about your experience. I hope that your e-visit has been valuable and will speed your recovery.

## 2021-10-29 NOTE — Progress Notes (Signed)
I have spent 5 minutes in review of e-visit questionnaire, review and updating patient chart, medical decision making and response to patient.   Dalinda Heidt Cody Danissa Rundle, PA-C    

## 2021-11-26 DIAGNOSIS — L821 Other seborrheic keratosis: Secondary | ICD-10-CM | POA: Diagnosis not present

## 2021-11-26 DIAGNOSIS — L570 Actinic keratosis: Secondary | ICD-10-CM | POA: Diagnosis not present

## 2021-11-26 DIAGNOSIS — C4401 Basal cell carcinoma of skin of lip: Secondary | ICD-10-CM | POA: Diagnosis not present

## 2021-12-04 ENCOUNTER — Other Ambulatory Visit: Payer: Self-pay | Admitting: Family Medicine

## 2021-12-04 DIAGNOSIS — G4700 Insomnia, unspecified: Secondary | ICD-10-CM

## 2021-12-31 DIAGNOSIS — M25562 Pain in left knee: Secondary | ICD-10-CM | POA: Diagnosis not present

## 2022-01-07 ENCOUNTER — Ambulatory Visit (INDEPENDENT_AMBULATORY_CARE_PROVIDER_SITE_OTHER): Payer: BC Managed Care – PPO | Admitting: Family Medicine

## 2022-01-07 ENCOUNTER — Encounter: Payer: Self-pay | Admitting: Family Medicine

## 2022-01-07 VITALS — BP 125/77 | HR 91 | Resp 16 | Ht 65.0 in | Wt 223.0 lb

## 2022-01-07 DIAGNOSIS — Z23 Encounter for immunization: Secondary | ICD-10-CM

## 2022-01-07 DIAGNOSIS — Z92241 Personal history of systemic steroid therapy: Secondary | ICD-10-CM

## 2022-01-07 DIAGNOSIS — E039 Hypothyroidism, unspecified: Secondary | ICD-10-CM | POA: Diagnosis not present

## 2022-01-07 DIAGNOSIS — F321 Major depressive disorder, single episode, moderate: Secondary | ICD-10-CM | POA: Diagnosis not present

## 2022-01-07 DIAGNOSIS — G4700 Insomnia, unspecified: Secondary | ICD-10-CM | POA: Diagnosis not present

## 2022-01-07 DIAGNOSIS — Z13220 Encounter for screening for lipoid disorders: Secondary | ICD-10-CM

## 2022-01-07 MED ORDER — DULOXETINE HCL 60 MG PO CPEP: 60.0000 mg | ORAL_CAPSULE | Freq: Every day | ORAL | 1 refills | Status: DC

## 2022-01-07 MED ORDER — LEVOTHYROXINE SODIUM 88 MCG PO TABS: 88.0000 ug | ORAL_TABLET | Freq: Every day | ORAL | 3 refills | Status: DC

## 2022-01-07 NOTE — Assessment & Plan Note (Signed)
Stable on trazodone.  We had just sent a 64-monthsupply last month. ?

## 2022-01-07 NOTE — Patient Instructions (Signed)
Plesae go for labs in July.   ?

## 2022-01-07 NOTE — Assessment & Plan Note (Signed)
Refilled medication today.  She wants to hold off and recheck her TSH may be in a couple of months.  I think that is perfectly reasonable.  She feels like it was off last time because she had missed some doses. ?

## 2022-01-07 NOTE — Progress Notes (Signed)
? ?Established Patient Office Visit ? ?Subjective:  ?Patient ID: Nancy Wang, female    DOB: 02-25-1971  Age: 51 y.o. MRN: [id number] ? ?CC:  ?Chief Complaint  ?Patient presents with  ? Insomnia  ?  Follow up. Patient states Trazodone is helping.   ? Hypothyroidism  ? ? ?HPI ?Nancy Wang presents for  ? ?Hypothyroidism - Taking medication regularly in the AM away from food and vitamins, etc. No recent change to skin, hair, or energy levels. Last TSH 4.4 but had missed some doses.   ? ?F/U insomnia -doing well on trazodone for sleep. ? ?She had a flare recently with her pseudogout and just had to have her knee drained she is on prednisone for it right now.  It is getting significantly better.  She has restarted her Colcrys. ? ?Follow-up depression mood-she is currently on Cymbalta and feels like overall it is doing really well.  She has felt a little bit more down just because of her pseudogout flare. ? ?GERD-etc. PPI daily. ? ?Past Medical History:  ?Diagnosis Date  ? Bronchitis   ? GERD (gastroesophageal reflux disease)   ? Pseudogout   ? Dr. Ouida Sills  ? RA (rheumatoid arthritis) ([place])   ? Follow by Tobie Lords, MD - [facility]  ? Thyroid disease   ? hypothyroidism  ? ? ?Past Surgical History:  ?Procedure Laterality Date  ? ABDOMINAL SURGERY    ? arthroscopic surgery    ? both knees, RT 1985 removed patella, LT 1988  ? ESSURE TUBAL LIGATION    ? exploratory lap  1985, 2004  ? for ovarian cysts, infertility  ? ? ?Family History  ?Problem Relation Age of Onset  ? Hypertension Mother   ? Diabetes Mother   ? Cancer Father   ?     prostate  ? Coronary artery disease Father   ? Depression Father   ? Breast cancer Cousin   ? Diabetes Other   ?     family history  ? Hypertension Other   ?     family history  ? ? ?Social History  ? ?Socioeconomic History  ? Marital status: Married  ?  Spouse name: Not on file  ? Number of children: Not on file  ? Years of education: Not on file  ? Highest  education level: Not on file  ?Occupational History  ? Not on file  ?Tobacco Use  ? Smoking status: Never  ? Smokeless tobacco: Never  ?Vaping Use  ? Vaping Use: Never used  ?Substance and Sexual Activity  ? Alcohol use: Yes  ? Drug use: No  ? Sexual activity: Not on file  ?  Comment: married, no regular exercise, limited by knee pain, has son.   ?Other Topics Concern  ? Not on file  ?Social History Narrative  ? Not on file  ? ?Social Determinants of Health  ? ?Financial Resource Strain: Not on file  ?Food Insecurity: Not on file  ?Transportation Needs: Not on file  ?Physical Activity: Not on file  ?Stress: Not on file  ?Social Connections: Not on file  ?Intimate Partner Violence: Not on file  ? ? ?Outpatient Medications Prior to Visit  ?Medication Sig Dispense Refill  ? albuterol (VENTOLIN HFA) 108 (90 Base) MCG/ACT inhaler INHALE 1-2 PUFFS INTO THE LUNGS EVERY 6 HOURS AS NEEDED FOR BRONCHOSPASM 18 g 3  ? COLCRYS 0.6 MG tablet Take 0.6 mg by mouth daily.  2  ? folic acid (FOLVITE) 1 MG tablet     ?  methotrexate (RHEUMATREX) 2.5 MG tablet Take 2.5 mg by mouth once a week. Take 9 tablets by mouth once a week    ? omeprazole (PRILOSEC) 20 MG capsule TAKE 1 CAPSULE DAILY 90 capsule 3  ? predniSONE (STERAPRED UNI-PAK 48 TAB) 10 MG (48) TBPK tablet See admin instructions.    ? RINVOQ 15 MG TB24 Take 1 tablet by mouth daily.    ? traZODone (DESYREL) 150 MG tablet TAKE 1 TABLET BY MOUTH AT BEDTIME. 90 tablet 1  ? DULoxetine (CYMBALTA) 60 MG capsule Take 1 capsule (60 mg total) by mouth daily. 90 capsule 1  ? levothyroxine (SYNTHROID) 88 MCG tablet TAKE 1 TABLET DAILY BEFORE BREAKFAST 90 tablet 3  ? amoxicillin (AMOXIL) 500 MG tablet Take 1 tablet (500 mg total) by mouth 2 (two) times daily. 20 tablet 0  ? ?No facility-administered medications prior to visit.  ? ? ?Allergies  ?Allergen Reactions  ? Citalopram Other (See Comments)  ?  QT prolongation  ? Morphine Sulfate   ? ? ?ROS ?Review of Systems ? ?  ?Objective:  ?   ?Physical Exam ?Constitutional:   ?   Appearance: Normal appearance. She is well-developed.  ?HENT:  ?   Head: Normocephalic and atraumatic.  ?Cardiovascular:  ?   Rate and Rhythm: Normal rate and regular rhythm.  ?   Heart sounds: Normal heart sounds.  ?Pulmonary:  ?   Effort: Pulmonary effort is normal.  ?   Breath sounds: Normal breath sounds.  ?Skin: ?   General: Skin is warm and dry.  ?Neurological:  ?   Mental Status: She is alert and oriented to person, place, and time.  ?Psychiatric:     ?   Behavior: Behavior normal.  ? ? ?BP 125/77   Pulse 91   Resp 16   Ht 5' 5"  (1.651 m)   Wt 223 lb (101.2 kg)   SpO2 96%   BMI 37.11 kg/m?  ?Wt Readings from Last 3 Encounters:  ?01/07/22 223 lb (101.2 kg)  ?10/19/21 216 lb (98 kg)  ?07/09/21 217 lb (98.4 kg)  ? ? ? ?Health Maintenance Due  ?Topic Date Due  ? HIV Screening  Never done  ? Hepatitis C Screening  Never done  ? Zoster Vaccines- Shingrix (1 of 2) Never done  ? ? ?There are no preventive care reminders to display for this patient. ? ?Lab Results  ?Component Value Date  ? TSH 3.48 11/04/2020  ? ?Lab Results  ?Component Value Date  ? WBC 9.8 10/19/2021  ? HGB 14.6 10/19/2021  ? HCT 42.8 10/19/2021  ? MCV 93.9 10/19/2021  ? PLT 376 10/19/2021  ? ?Lab Results  ?Component Value Date  ? NA 137 10/19/2021  ? K 4.4 10/19/2021  ? CO2 31 10/19/2021  ? GLUCOSE 95 10/19/2021  ? BUN 13 10/19/2021  ? CREATININE 1.01 10/19/2021  ? BILITOT 0.8 10/19/2021  ? ALKPHOS 83 11/04/2020  ? AST 22 10/19/2021  ? ALT 36 (H) 10/19/2021  ? PROT 7.4 10/19/2021  ? ALBUMIN 4.3 11/04/2020  ? CALCIUM 9.6 10/19/2021  ? EGFR 68 10/19/2021  ? ?Lab Results  ?Component Value Date  ? CHOL 233 (H) 07/19/2019  ? ?Lab Results  ?Component Value Date  ? HDL 56 07/19/2019  ? ?Lab Results  ?Component Value Date  ? LDLCALC 147 (H) 07/19/2019  ? ?Lab Results  ?Component Value Date  ? TRIG 162 (H) 07/19/2019  ? ?Lab Results  ?Component Value Date  ? CHOLHDL 4.2 07/19/2019  ? ?  Lab Results  ?Component  Value Date  ? HGBA1C 5.4 05/16/2018  ? ? ?  ?Assessment & Plan:  ? ?Problem List Items Addressed This Visit   ? ?  ? Endocrine  ? Hypothyroidism - Primary  ?  Refilled medication today.  She wants to hold off and recheck her TSH may be in a couple of months.  I think that is perfectly reasonable.  She feels like it was off last time because she had missed some doses. ?  ?  ? Relevant Medications  ? levothyroxine (SYNTHROID) 88 MCG tablet  ? Other Relevant Orders  ? TSH  ?  ? Other  ? Insomnia  ?  Stable on trazodone.  We had just sent a 74-monthsupply last month. ?  ?  ? Depression  ?  She is happy with her current regimen.  Continue.  Follow-up in 6 months ? ?[place] [facility] [facility] ?PHQ-9 Total Score 4  ? ?  ? ? ?  ?  ? Relevant Medications  ? DULoxetine (CYMBALTA) 60 MG capsule  ? ?Other Visit Diagnoses   ? ? Immunization due      ? History of recent steroid use      ? Relevant Orders  ? Hemoglobin A1c  ? Screening, lipid      ? Relevant Orders  ? Lipid Panel w/reflex Direct LDL  ? ?  ? ? ?Meds ordered this encounter  ?Medications  ? DULoxetine (CYMBALTA) 60 MG capsule  ?  Sig: Take 1 capsule (60 mg total) by mouth daily.  ?  Dispense:  90 capsule  ?  Refill:  1  ?  Please d/c 30 mg dose  ? levothyroxine (SYNTHROID) 88 MCG tablet  ?  Sig: Take 1 tablet (88 mcg total) by mouth daily before breakfast.  ?  Dispense:  90 tablet  ?  Refill:  3  ? ? ?Follow-up: Return in about 6 months (around 07/09/2022) for Mood and sleep.  .  ? ? ?CBeatrice Lecher MD ?

## 2022-01-07 NOTE — Assessment & Plan Note (Signed)
She is happy with her current regimen.  Continue.  Follow-up in 6 months ? ?[facility] Office Visit from 01/07/2022 in [facility]  ?PHQ-9 Total Score 4  ?  ? ? ?

## 2022-01-20 DIAGNOSIS — M0589 Other rheumatoid arthritis with rheumatoid factor of multiple sites: Secondary | ICD-10-CM | POA: Diagnosis not present

## 2022-01-20 DIAGNOSIS — M25562 Pain in left knee: Secondary | ICD-10-CM | POA: Diagnosis not present

## 2022-01-20 DIAGNOSIS — M1120 Other chondrocalcinosis, unspecified site: Secondary | ICD-10-CM | POA: Diagnosis not present

## 2022-01-20 DIAGNOSIS — Z79899 Other long term (current) drug therapy: Secondary | ICD-10-CM | POA: Diagnosis not present

## 2022-01-20 DIAGNOSIS — M2550 Pain in unspecified joint: Secondary | ICD-10-CM | POA: Diagnosis not present

## 2022-01-20 DIAGNOSIS — R5382 Chronic fatigue, unspecified: Secondary | ICD-10-CM | POA: Diagnosis not present

## 2022-01-20 LAB — LIPID PANEL
Cholesterol: 220 — AB (ref 0–200)
HDL: 58 (ref 35–70)
LDL Cholesterol: 144
Triglycerides: 101 (ref 40–160)

## 2022-01-20 LAB — COMPREHENSIVE METABOLIC PANEL
Albumin: 3.9 (ref 3.5–5.0)
Calcium: 9.6 (ref 8.7–10.7)
Globulin: 2.6
eGFR: 82

## 2022-01-20 LAB — BASIC METABOLIC PANEL
BUN: 16 (ref 4–21)
CO2: 25 — AB (ref 13–22)
Chloride: 101 (ref 99–108)
Creatinine: 0.9 (ref 0.5–1.1)
Glucose: 148
Potassium: 4.2 mEq/L (ref 3.5–5.1)
Sodium: 4 — AB (ref 137–147)

## 2022-01-20 LAB — HEPATIC FUNCTION PANEL
ALT: 20 U/L (ref 7–35)
AST: 12 — AB (ref 13–35)
Alkaline Phosphatase: 95 (ref 25–125)
Bilirubin, Total: 0.6

## 2022-01-20 LAB — QUANTIFERON(R)-TB: QUANTIFERON(R)-TB GOLD: NEGATIVE

## 2022-02-19 DIAGNOSIS — Z79899 Other long term (current) drug therapy: Secondary | ICD-10-CM | POA: Diagnosis not present

## 2022-02-19 DIAGNOSIS — M25462 Effusion, left knee: Secondary | ICD-10-CM | POA: Diagnosis not present

## 2022-02-19 DIAGNOSIS — M25562 Pain in left knee: Secondary | ICD-10-CM | POA: Diagnosis not present

## 2022-02-19 DIAGNOSIS — M0589 Other rheumatoid arthritis with rheumatoid factor of multiple sites: Secondary | ICD-10-CM | POA: Diagnosis not present

## 2022-02-19 DIAGNOSIS — M1120 Other chondrocalcinosis, unspecified site: Secondary | ICD-10-CM | POA: Diagnosis not present

## 2022-02-19 DIAGNOSIS — M2550 Pain in unspecified joint: Secondary | ICD-10-CM | POA: Diagnosis not present

## 2022-02-19 DIAGNOSIS — M1991 Primary osteoarthritis, unspecified site: Secondary | ICD-10-CM | POA: Diagnosis not present

## 2022-03-13 ENCOUNTER — Emergency Department (INDEPENDENT_AMBULATORY_CARE_PROVIDER_SITE_OTHER)
Admission: EM | Admit: 2022-03-13 | Discharge: 2022-03-13 | Disposition: A | Discharge status: Home / Self Care | Payer: BC Managed Care – PPO | Source: Home / Self Care

## 2022-03-13 ENCOUNTER — Other Ambulatory Visit: Payer: Self-pay

## 2022-03-13 DIAGNOSIS — J019 Acute sinusitis, unspecified: Secondary | ICD-10-CM

## 2022-03-13 DIAGNOSIS — R051 Acute cough: Secondary | ICD-10-CM | POA: Diagnosis not present

## 2022-03-13 MED ORDER — ALBUTEROL SULFATE HFA 108 (90 BASE) MCG/ACT IN AERS: 1.0000 | INHALATION_SPRAY | Freq: Four times a day (QID) | RESPIRATORY_TRACT | 0 refills | Status: DC | PRN

## 2022-03-13 MED ORDER — AMOXICILLIN-POT CLAVULANATE 875-125 MG PO TABS: 1.0000 | ORAL_TABLET | Freq: Two times a day (BID) | ORAL | 0 refills | Status: DC

## 2022-03-13 MED ORDER — BENZONATATE 100 MG PO CAPS: 100.0000 mg | ORAL_CAPSULE | Freq: Three times a day (TID) | ORAL | 0 refills | Status: DC

## 2022-03-13 NOTE — ED Provider Notes (Signed)
[name]    CSN: [id number] Arrival date & time: 03/13/22  1101      History   Chief Complaint Chief Complaint  Patient presents with   Cough   Nasal Congestion    HPI Nancy Wang is a 51 y.o. female.   Pt complains of one week of congestion, cough.  She has a h/o asthma.  Ran out of rescue inhaler. She reports subjective fever.  She felt her sx were improving, but now they are much worse. She has been taking tylenol for headache, but nothing for cough. Denies shortness of breath or wheezing.     Past Medical History:  Diagnosis Date   Bronchitis    GERD (gastroesophageal reflux disease)    Pseudogout    Dr. 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[place]) 10/17/2017   Hyperlipidemia 03/05/2015   Insomnia 06/07/2014   QT prolongation 08/02/2013   UNSPECIFIED VITAMIN D DEFICIENCY 12/10/2010   ALLERGIC RHINITIS 10/08/2010   HYPOXEMIA 09/02/2010   Depression 06/26/2010   PSEUDOGOUT 05/26/2009   Rheumatoid arthritis ([place]) 05/26/2009   IRON, SERUM, ELEVATED 05/26/2009   ELEVATED BLOOD PRESSURE 05/26/2009   Hypothyroidism 03/31/2009   KNEE PAIN, LEFT 03/31/2009    Past Surgical History:  Procedure Laterality Date   ABDOMINAL SURGERY     arthroscopic surgery     both knees, RT 1985 removed patella, LT 1988   ESSURE TUBAL LIGATION     exploratory lap  1985, 2004   for ovarian cysts, infertility    OB History   No obstetric history on file.      Home Medications    Prior to Admission  medications   Medication Sig Start Date End Date Taking? Authorizing Provider  albuterol (VENTOLIN HFA) 108 (90 Base) MCG/ACT inhaler Inhale 1-2 puffs into the lungs every 6 (six) hours as needed for wheezing or shortness of breath. 03/13/22  Yes Ward, Lenise Arena, PA-C  amoxicillin-clavulanate (AUGMENTIN) 875-125 MG tablet Take 1 tablet by mouth every 12 (twelve) hours. 03/13/22  Yes Ward, Lenise Arena, PA-C  benzonatate (TESSALON) 100 MG capsule Take 1 capsule (100 mg total) by mouth every 8 (eight) hours. 03/13/22  Yes Ward, Lenise Arena, PA-C  COLCRYS 0.6 MG tablet Take 0.6 mg by mouth daily as needed. 08/22/17   [provider]  DULoxetine (CYMBALTA) 60 MG capsule Take 1 capsule (60 mg total) by mouth daily. 01/07/22   Hali Marry, MD  folic acid (FOLVITE) 1 MG tablet  12/19/15   [provider]  levothyroxine (SYNTHROID) 88 MCG tablet Take 1 tablet (88 mcg total) by mouth daily before breakfast. 01/07/22   Hali Marry, MD  methotrexate (RHEUMATREX) 2.5 MG tablet Take 2.5 mg by mouth once a week. Take 9 tablets by mouth once a week 06/06/14   [provider]  omeprazole (PRILOSEC) 20 MG capsule TAKE 1 CAPSULE DAILY 07/01/21   Hali Marry, MD  predniSONE (STERAPRED UNI-PAK 48 TAB) 10 MG (48) TBPK tablet See admin instructions. 01/04/22   [provider]  RINVOQ 15 MG TB24 Take 1 tablet by mouth daily. 01/02/20   [provider]  traZODone (DESYREL) 150 MG tablet TAKE 1 TABLET BY MOUTH AT BEDTIME. 12/04/21   Hali Marry, MD    Family History Family History  Problem Relation Age of Onset   Hypertension Mother    Diabetes Mother    Cancer Father        prostate   Coronary artery disease Father    Depression Father    Breast cancer Cousin    Diabetes Other        family history   Hypertension Other        family history    Social History Social History   Tobacco Use   Smoking status: Never   Smokeless tobacco: Never   Vaping Use   Vaping Use: Never used  Substance Use Topics   Alcohol use: Yes    Comment: occasionally   Drug use: No     Allergies   Citalopram and Morphine sulfate   Review of Systems Review of Systems  Constitutional:  Negative for chills and fever.  HENT:  Positive for congestion. Negative for ear pain and sore throat.   Eyes:  Negative for pain and visual disturbance.  Respiratory:  Positive for cough. Negative for shortness of breath.   Cardiovascular:  Negative for chest pain and palpitations.  Gastrointestinal:  Negative for abdominal pain and vomiting.  Genitourinary:  Negative for dysuria and hematuria.  Musculoskeletal:  Negative for arthralgias and back pain.  Skin:  Negative for color change and rash.  Neurological:  Negative for seizures and syncope.  All other systems reviewed and are negative.    Physical Exam Triage Vital Signs ED Triage Vitals  Enc Vitals Group     BP 03/13/22 1122 118/82     Pulse Rate 03/13/22 1122 92     Resp 03/13/22 1122 20     Temp 03/13/22 1122 99 F (37.2 C)     Temp Source 03/13/22 1122 Oral     SpO2 03/13/22 1122 95 %     Weight 03/13/22 1117 215 lb (97.5 kg)     Height 03/13/22 1117 '5\' 6"'$  (1.676 m)     Head Circumference --      Peak Flow --      Pain Score 03/13/22 1116 4     Pain Loc --      Pain Edu? --      Excl. in [place]? --    No data found.  Updated Vital Signs BP 118/82 (BP Location: Right Arm)   Pulse 92   Temp 99 F (37.2 C) (Oral)   Resp 20   Ht '5\' 6"'$  (1.676 m)   Wt 215 lb (97.5 kg)   LMP 02/24/2022 (Approximate)   SpO2 95%   BMI 34.70 kg/m   Visual Acuity Right Eye Distance:   Left Eye Distance:   Bilateral Distance:    Right Eye Near:   Left Eye Near:    Bilateral Near:     Physical Exam Vitals and nursing note reviewed.  Constitutional:      General: She is not in acute distress.    Appearance: She is well-developed.  HENT:     Head: Normocephalic and atraumatic.  Eyes:      Conjunctiva/sclera: Conjunctivae normal.  Cardiovascular:     Rate and Rhythm: Normal rate and regular rhythm.     Heart sounds: No murmur heard. Pulmonary:  Effort: Pulmonary effort is normal. No respiratory distress.     Breath sounds: Examination of the right-lower field reveals rhonchi. Examination of the left-lower field reveals rhonchi. Rhonchi present.  Abdominal:     Palpations: Abdomen is soft.     Tenderness: There is no abdominal tenderness.  Musculoskeletal:        General: No swelling.     Cervical back: Neck supple.  Skin:    General: Skin is warm and dry.     Capillary Refill: Capillary refill takes less than 2 seconds.  Neurological:     Mental Status: She is alert.  Psychiatric:        Mood and Affect: Mood normal.      UC Treatments / Results  Labs (all labs ordered are listed, but only abnormal results are displayed) Labs Reviewed - No data to display  EKG   Radiology No results found.  Procedures Procedures (including critical care time)  Medications Ordered in UC Medications - No data to display  Initial Impression / Assessment and Plan / UC Course  I have reviewed the triage vital signs and the nursing notes.  Pertinent labs & imaging results that were available during my care of the patient were reviewed by me and considered in my medical decision making (see chart for details).     Antibiotic prescribed.  Albuterol inhaler refilled.  Tessalon pearls prescribed.  Supportive care discussed. Return precautions discussed. Vitals wnl, stable for discharge.  Final Clinical Impressions(s) / UC Diagnoses   Final diagnoses:  Acute non-recurrent sinusitis, unspecified location  Acute cough     Discharge Instructions      Take antibiotic as prescribed Can take tessalon pearls as needed for cough every 8 hours Use inhaler as needed for chest tightness, wheezing, or shortness of breath Can take Delsym if needed for cough Drink plenty of  fluids Return if no improvement or symptoms become worse.     ED Prescriptions     Medication Sig Dispense Auth. Provider   amoxicillin-clavulanate (AUGMENTIN) 875-125 MG tablet Take 1 tablet by mouth every 12 (twelve) hours. 14 tablet Ward, Lenise Arena, PA-C   albuterol (VENTOLIN HFA) 108 (90 Base) MCG/ACT inhaler Inhale 1-2 puffs into the lungs every 6 (six) hours as needed for wheezing or shortness of breath. 1 each Ward, Lenise Arena, PA-C   benzonatate (TESSALON) 100 MG capsule Take 1 capsule (100 mg total) by mouth every 8 (eight) hours. 21 capsule Ward, Lenise Arena, PA-C      PDMP not reviewed this encounter.   Ward, Lenise Arena, PA-C 03/13/22 1426

## 2022-03-13 NOTE — ED Triage Notes (Addendum)
Pt presents to Urgent Care with c/o cough and nasal congestion x approx one week. Reports low-grade fever at onset of symptoms. Has not done home COVID test.

## 2022-03-13 NOTE — Discharge Instructions (Signed)
Take antibiotic as prescribed Can take tessalon pearls as needed for cough every 8 hours Use inhaler as needed for chest tightness, wheezing, or shortness of breath Can take Delsym if needed for cough Drink plenty of fluids Return if no improvement or symptoms become worse.

## 2022-04-29 ENCOUNTER — Other Ambulatory Visit: Payer: Self-pay | Admitting: Family Medicine

## 2022-04-29 DIAGNOSIS — G4700 Insomnia, unspecified: Secondary | ICD-10-CM

## 2022-07-12 ENCOUNTER — Ambulatory Visit (INDEPENDENT_AMBULATORY_CARE_PROVIDER_SITE_OTHER): Payer: BC Managed Care – PPO | Admitting: Family Medicine

## 2022-07-12 ENCOUNTER — Encounter: Payer: Self-pay | Admitting: Family Medicine

## 2022-07-12 VITALS — BP 119/73 | HR 128 | Ht 66.0 in | Wt 225.0 lb

## 2022-07-12 DIAGNOSIS — Z23 Encounter for immunization: Secondary | ICD-10-CM

## 2022-07-12 DIAGNOSIS — R232 Flushing: Secondary | ICD-10-CM | POA: Diagnosis not present

## 2022-07-12 DIAGNOSIS — F321 Major depressive disorder, single episode, moderate: Secondary | ICD-10-CM | POA: Diagnosis not present

## 2022-07-12 DIAGNOSIS — G2581 Restless legs syndrome: Secondary | ICD-10-CM

## 2022-07-12 DIAGNOSIS — E039 Hypothyroidism, unspecified: Secondary | ICD-10-CM | POA: Diagnosis not present

## 2022-07-12 DIAGNOSIS — G4700 Insomnia, unspecified: Secondary | ICD-10-CM | POA: Diagnosis not present

## 2022-07-12 MED ORDER — TRAZODONE HCL 150 MG PO TABS: 150.0000 mg | ORAL_TABLET | Freq: Every day | ORAL | 1 refills | Status: DC

## 2022-07-12 NOTE — Assessment & Plan Note (Signed)
Doing well with trazodone.  Will refill medication today.

## 2022-07-12 NOTE — Assessment & Plan Note (Signed)
She feels like overall she is doing well.  Plan to recheck TSH today

## 2022-07-12 NOTE — Assessment & Plan Note (Signed)
We discussed possible trial of over-the-counter medications such as prime numbers oral or black cohosh.  If not helpful then we can look at nonhormonal types of medications and finally hormones if needed.  For now she has been mostly been able to just work on clearing and trying to keep cool.

## 2022-07-12 NOTE — Assessment & Plan Note (Signed)
He has had a slight bump up in symptoms.  PHQ-9 score of 9 today but she feels like some of it is related to going into menopause and the hot flashes.  So right now she is okay with her current dosing regimen.

## 2022-07-12 NOTE — Assessment & Plan Note (Signed)
Recommend check for iron deficiency first.  If normal then could consider a trial of medication such as ropinirole.

## 2022-07-12 NOTE — Progress Notes (Signed)
Established Patient Office Visit  Subjective   Patient ID: Nancy Wang, female    DOB: 02-11-1971  Age: 51 y.o. MRN: [id number]  Chief Complaint  Patient presents with   Follow-up    Sleep and mood    HPI  6 mo f/u for Mood and sleep   F/U insomnia -currently using trazodone for sleep. She has been having bad hotflashes.  They are occurring during the day and at night.  They just really ramped up about 2 weeks ago.  F/U depression -currently on Cymbalta.  She feels like overall she is doing well on her current regimen.  Hypothyroidism - Taking medication regularly in the AM away from food and vitamins, etc. No recent change to skin, hair, or energy levels.  So dealing with some restless leg symptoms.  Mostly started in the evenings and at bedtime.  It just seems a little bit worse lately.    ROS    Objective:     BP 119/73   Pulse (!) 128   Ht '5\' 6"'$  (1.676 m)   Wt 225 lb (102.1 kg)   SpO2 99%   BMI 36.32 kg/m    Physical Exam Vitals and nursing note reviewed.  Constitutional:      Appearance: She is well-developed.  HENT:     Head: Normocephalic and atraumatic.  Cardiovascular:     Rate and Rhythm: Normal rate and regular rhythm.     Heart sounds: Normal heart sounds.  Pulmonary:     Effort: Pulmonary effort is normal.     Breath sounds: Normal breath sounds.  Skin:    General: Skin is warm and dry.  Neurological:     Mental Status: She is alert and oriented to person, place, and time.  Psychiatric:        Behavior: Behavior normal.      No results found for any visits on 07/12/22.    The 10-year ASCVD risk score (Arnett DK, et al., 2019) is: 1.3%* (Cholesterol units were assumed)    Assessment & Plan:   Problem List Items Addressed This Visit       Cardiovascular and Mediastinum   Hot flashes    We discussed possible trial of over-the-counter medications such as prime numbers oral or black cohosh.  If not helpful then we can look at  nonhormonal types of medications and finally hormones if needed.  For now she has been mostly been able to just work on clearing and trying to keep cool.        Endocrine   Hypothyroidism    She feels like overall she is doing well.  Plan to recheck TSH today      Relevant Orders   TSH     Other   RLS (restless legs syndrome)    Recommend check for iron deficiency first.  If normal then could consider a trial of medication such as ropinirole.      Relevant Orders   Fe+TIBC+Fer   Insomnia    Doing well with trazodone.  Will refill medication today.      Relevant Medications   traZODone (DESYREL) 150 MG tablet   Depression - Primary    He has had a slight bump up in symptoms.  PHQ-9 score of 9 today but she feels like some of it is related to going into menopause and the hot flashes.  So right now she is okay with her current dosing regimen.      Relevant Medications  traZODone (DESYREL) 150 MG tablet    Return in about 6 months (around 01/11/2023) for Mood and thyroid .    Beatrice Lecher, MD

## 2022-07-16 ENCOUNTER — Other Ambulatory Visit: Payer: Self-pay | Admitting: Family Medicine

## 2022-07-16 DIAGNOSIS — F321 Major depressive disorder, single episode, moderate: Secondary | ICD-10-CM

## 2022-12-02 DIAGNOSIS — C44719 Basal cell carcinoma of skin of left lower limb, including hip: Secondary | ICD-10-CM | POA: Diagnosis not present

## 2022-12-02 DIAGNOSIS — L578 Other skin changes due to chronic exposure to nonionizing radiation: Secondary | ICD-10-CM | POA: Diagnosis not present

## 2022-12-02 DIAGNOSIS — D225 Melanocytic nevi of trunk: Secondary | ICD-10-CM | POA: Diagnosis not present

## 2022-12-02 DIAGNOSIS — L821 Other seborrheic keratosis: Secondary | ICD-10-CM | POA: Diagnosis not present

## 2022-12-02 DIAGNOSIS — L814 Other melanin hyperpigmentation: Secondary | ICD-10-CM | POA: Diagnosis not present

## 2022-12-02 DIAGNOSIS — C44619 Basal cell carcinoma of skin of left upper limb, including shoulder: Secondary | ICD-10-CM | POA: Diagnosis not present

## 2022-12-02 DIAGNOSIS — C44319 Basal cell carcinoma of skin of other parts of face: Secondary | ICD-10-CM | POA: Diagnosis not present

## 2022-12-03 ENCOUNTER — Encounter: Payer: Self-pay | Admitting: Family Medicine

## 2022-12-03 DIAGNOSIS — M069 Rheumatoid arthritis, unspecified: Secondary | ICD-10-CM

## 2022-12-03 DIAGNOSIS — E785 Hyperlipidemia, unspecified: Secondary | ICD-10-CM

## 2022-12-03 DIAGNOSIS — E039 Hypothyroidism, unspecified: Secondary | ICD-10-CM

## 2022-12-03 DIAGNOSIS — G2581 Restless legs syndrome: Secondary | ICD-10-CM

## 2022-12-06 NOTE — Telephone Encounter (Signed)
No orders of the defined types were placed in this encounter.  Orders Placed This Encounter  Procedures   Fe+TIBC+Fer   TSH   Sedimentation rate   COMPLETE METABOLIC PANEL WITH GFR   Lipid Panel w/reflex Direct LDL   Ambulatory referral to Rheumatology    Referral Priority:   Routine    Referral Type:   Consultation    Referral Reason:   Specialty Services Required    Requested Specialty:   Rheumatology    Number of Visits Requested:   1

## 2022-12-16 DIAGNOSIS — M059 Rheumatoid arthritis with rheumatoid factor, unspecified: Secondary | ICD-10-CM | POA: Diagnosis not present

## 2022-12-17 LAB — HEPATIC FUNCTION PANEL
ALT: 43 U/L — AB (ref 7–35)
AST: 22 (ref 13–35)
Alkaline Phosphatase: 118 (ref 25–125)
Bilirubin, Total: 0.9

## 2022-12-17 LAB — BASIC METABOLIC PANEL
Chloride: 99 (ref 99–108)
Creatinine: 1 (ref 0.5–1.1)
Potassium: 3.9 mEq/L (ref 3.5–5.1)
Sodium: 136 — AB (ref 137–147)

## 2022-12-17 LAB — CBC AND DIFFERENTIAL
Hemoglobin: 15 (ref 12.0–16.0)
Platelets: 306 10*3/uL (ref 150–400)
WBC: 9.6

## 2022-12-17 LAB — COMPREHENSIVE METABOLIC PANEL
Calcium: 9.2 (ref 8.7–10.7)
eGFR: 66

## 2022-12-17 LAB — HEPATITIS B SURFACE ANTIGEN: Hepatitis B Surface Ag: NONREACTIVE

## 2022-12-17 LAB — C-REACTIVE PROTEIN: CRP: 1.7

## 2022-12-17 LAB — POCT ERYTHROCYTE SEDIMENTATION RATE, NON-AUTOMATED: Sed Rate: 45

## 2022-12-17 LAB — HCV ANTIBODY: HCV Ab: NONREACTIVE

## 2022-12-17 LAB — HEP B SURFACE AB: HBsAb Quant HBIG Assessment: 146

## 2022-12-30 DIAGNOSIS — C44319 Basal cell carcinoma of skin of other parts of face: Secondary | ICD-10-CM | POA: Diagnosis not present

## 2023-01-07 ENCOUNTER — Other Ambulatory Visit: Payer: Self-pay | Admitting: Family Medicine

## 2023-01-07 DIAGNOSIS — G4700 Insomnia, unspecified: Secondary | ICD-10-CM

## 2023-01-07 DIAGNOSIS — F321 Major depressive disorder, single episode, moderate: Secondary | ICD-10-CM

## 2023-01-10 ENCOUNTER — Other Ambulatory Visit: Payer: Self-pay | Admitting: Family Medicine

## 2023-01-10 DIAGNOSIS — E039 Hypothyroidism, unspecified: Secondary | ICD-10-CM

## 2023-01-12 ENCOUNTER — Ambulatory Visit (INDEPENDENT_AMBULATORY_CARE_PROVIDER_SITE_OTHER): Payer: BC Managed Care – PPO | Admitting: Family Medicine

## 2023-01-12 ENCOUNTER — Encounter: Payer: Self-pay | Admitting: Family Medicine

## 2023-01-12 VITALS — BP 122/73 | HR 83 | Ht 66.0 in | Wt 229.0 lb

## 2023-01-12 DIAGNOSIS — M069 Rheumatoid arthritis, unspecified: Secondary | ICD-10-CM | POA: Diagnosis not present

## 2023-01-12 DIAGNOSIS — Z23 Encounter for immunization: Secondary | ICD-10-CM

## 2023-01-12 DIAGNOSIS — E559 Vitamin D deficiency, unspecified: Secondary | ICD-10-CM | POA: Diagnosis not present

## 2023-01-12 DIAGNOSIS — F321 Major depressive disorder, single episode, moderate: Secondary | ICD-10-CM

## 2023-01-12 DIAGNOSIS — D849 Immunodeficiency, unspecified: Secondary | ICD-10-CM

## 2023-01-12 DIAGNOSIS — E785 Hyperlipidemia, unspecified: Secondary | ICD-10-CM

## 2023-01-12 DIAGNOSIS — E039 Hypothyroidism, unspecified: Secondary | ICD-10-CM | POA: Diagnosis not present

## 2023-01-12 DIAGNOSIS — Z85828 Personal history of other malignant neoplasm of skin: Secondary | ICD-10-CM

## 2023-01-12 NOTE — Patient Instructions (Signed)
Please try to schedule your mammogram when you get a chance.  You can also use MyChart to schedule your mammogram.

## 2023-01-12 NOTE — Progress Notes (Signed)
Established Patient Office Visit  Subjective   Patient ID: Nancy Wang, female    DOB: 04-Jan-1971  Age: 52 y.o. MRN: [id number]  Chief Complaint  Patient presents with   Hypothyroidism   mood    HPI  Hypothyroidism - Taking medication regularly in the AM away from food and vitamins, etc. No recent change to skin, hair, or energy levels.  F/U Depression -she reports that she is actually doing well.  PHQ-9 and GAD-7 score of 0 today.  She is happy with her current regimen.  Did get back in with a rheumatologist and they have put her back on written Volk.  They also put her on 30 days of prednisone to just get a lot of her joints under control.  She says in fact her left knee looks great.  She says that the smallest she has seen it in a really long time.  She also recently saw the Mohs surgeon she had a basal cell that had to be removed from her forehead they were unable to get the cells completely removed with just scraping she has a basal cell on her left upper arm that also is going to be treated    ROS    Objective:     BP 122/73   Pulse 83   Ht 5\' 6"  (1.676 m)   Wt 229 lb (103.9 kg)   SpO2 93%   BMI 36.96 kg/m    Physical Exam Vitals and nursing note reviewed.  Constitutional:      Appearance: She is well-developed.  HENT:     Head: Normocephalic and atraumatic.  Cardiovascular:     Rate and Rhythm: Normal rate and regular rhythm.     Heart sounds: Normal heart sounds.  Pulmonary:     Effort: Pulmonary effort is normal.     Breath sounds: Normal breath sounds.  Skin:    General: Skin is warm and dry.  Neurological:     Mental Status: She is alert and oriented to person, place, and time.  Psychiatric:        Behavior: Behavior normal.     No results found for any visits on 01/12/23.    The 10-year ASCVD risk score (Arnett DK, et al., 2019) is: 1.4%* (Cholesterol units were assumed)    Assessment & Plan:   Problem List Items Addressed This  Visit       Endocrine   Hypothyroidism - Primary    Due to recheck TSH.      Relevant Orders   Lipid Panel w/reflex Direct LDL   COMPLETE METABOLIC PANEL WITH GFR   CBC   TSH     Musculoskeletal and Integument   Rheumatoid arthritis    She is at the end of a 30-day prednisone taper.  Back on Rinvoq      Relevant Orders   Lipid Panel w/reflex Direct LDL   COMPLETE METABOLIC PANEL WITH GFR   CBC   TSH   VITAMIN D 25 Hydroxy (Vit-D Deficiency, Fractures)     Other   Vitamin D deficiency   Relevant Orders   VITAMIN D 25 Hydroxy (Vit-D Deficiency, Fractures)   Immunocompromised patient    Vaccines are up to date      Hyperlipidemia   Relevant Orders   Lipid Panel w/reflex Direct LDL   COMPLETE METABOLIC PANEL WITH GFR   Depression   Relevant Orders   Lipid Panel w/reflex Direct LDL   COMPLETE METABOLIC PANEL WITH GFR   CBC  TSH   Other Visit Diagnoses     History of basal cell cancer       Relevant Orders   Lipid Panel w/reflex Direct LDL   COMPLETE METABOLIC PANEL WITH GFR   CBC   TSH      Encouraged her to schedule her mammogram last 1 was almost 2 years ago.  Return in about 6 months (around 07/14/2023) for Mood medicatin, and thyroid.    Nani Gasser, MD

## 2023-01-12 NOTE — Assessment & Plan Note (Signed)
She is at the end of a 30-day prednisone taper.  Back on Rinvoq

## 2023-01-12 NOTE — Assessment & Plan Note (Signed)
Due to recheck TSH. 

## 2023-01-12 NOTE — Assessment & Plan Note (Signed)
Vaccines are up to date ?

## 2023-01-17 DIAGNOSIS — C44619 Basal cell carcinoma of skin of left upper limb, including shoulder: Secondary | ICD-10-CM | POA: Diagnosis not present

## 2023-03-17 ENCOUNTER — Encounter: Payer: Self-pay | Admitting: Family Medicine

## 2023-04-09 ENCOUNTER — Other Ambulatory Visit: Payer: Self-pay | Admitting: Family Medicine

## 2023-04-09 DIAGNOSIS — F321 Major depressive disorder, single episode, moderate: Secondary | ICD-10-CM

## 2023-04-09 DIAGNOSIS — G4700 Insomnia, unspecified: Secondary | ICD-10-CM

## 2023-04-09 DIAGNOSIS — E039 Hypothyroidism, unspecified: Secondary | ICD-10-CM

## 2023-04-14 ENCOUNTER — Other Ambulatory Visit: Payer: Self-pay

## 2023-04-14 DIAGNOSIS — K219 Gastro-esophageal reflux disease without esophagitis: Secondary | ICD-10-CM

## 2023-04-14 NOTE — Telephone Encounter (Signed)
[facility] but it did not have any refills. Requesting rx rf of  Omeprazole 20mg   last written 07/21/2021 Last OV 01/12/2023  Upcoming appt schld for 07/14/2023

## 2023-04-15 MED ORDER — OMEPRAZOLE 20 MG PO CPDR: 20.0000 mg | DELAYED_RELEASE_CAPSULE | Freq: Every day | ORAL | 3 refills | Status: DC

## 2023-07-06 ENCOUNTER — Other Ambulatory Visit: Payer: Self-pay | Admitting: Family Medicine

## 2023-07-06 DIAGNOSIS — F321 Major depressive disorder, single episode, moderate: Secondary | ICD-10-CM

## 2023-07-06 DIAGNOSIS — G4700 Insomnia, unspecified: Secondary | ICD-10-CM

## 2023-07-06 DIAGNOSIS — E039 Hypothyroidism, unspecified: Secondary | ICD-10-CM

## 2023-07-14 ENCOUNTER — Encounter: Payer: Self-pay | Admitting: Family Medicine

## 2023-07-14 ENCOUNTER — Ambulatory Visit (INDEPENDENT_AMBULATORY_CARE_PROVIDER_SITE_OTHER): Payer: BC Managed Care – PPO | Admitting: Family Medicine

## 2023-07-14 ENCOUNTER — Ambulatory Visit: Payer: BC Managed Care – PPO

## 2023-07-14 VITALS — BP 129/68 | HR 85 | Ht 66.0 in | Wt 234.0 lb

## 2023-07-14 DIAGNOSIS — E782 Mixed hyperlipidemia: Secondary | ICD-10-CM

## 2023-07-14 DIAGNOSIS — Z23 Encounter for immunization: Secondary | ICD-10-CM

## 2023-07-14 DIAGNOSIS — R7309 Other abnormal glucose: Secondary | ICD-10-CM | POA: Diagnosis not present

## 2023-07-14 DIAGNOSIS — Z1231 Encounter for screening mammogram for malignant neoplasm of breast: Secondary | ICD-10-CM

## 2023-07-14 DIAGNOSIS — E559 Vitamin D deficiency, unspecified: Secondary | ICD-10-CM

## 2023-07-14 DIAGNOSIS — E039 Hypothyroidism, unspecified: Secondary | ICD-10-CM

## 2023-07-14 DIAGNOSIS — F330 Major depressive disorder, recurrent, mild: Secondary | ICD-10-CM | POA: Diagnosis not present

## 2023-07-14 DIAGNOSIS — G4700 Insomnia, unspecified: Secondary | ICD-10-CM

## 2023-07-14 NOTE — Assessment & Plan Note (Signed)
Continue trazodone nightly.

## 2023-07-14 NOTE — Assessment & Plan Note (Signed)
Recheck Vitamin D.

## 2023-07-14 NOTE — Assessment & Plan Note (Signed)
Due to recheck lipids.

## 2023-07-14 NOTE — Progress Notes (Signed)
Established Patient Office Visit  Subjective   Patient ID: Nancy Wang, female    DOB: 09-11-1971  Age: 52 y.o. MRN: [id number]  Chief Complaint  Patient presents with   Medical Management of Chronic Issues    HPI  Here for f/u mood. She is doing OK.  Needs updated labs.  She is doing well with her RA. ON Rinqog.    Hypothyroidism - Taking medication regularly in the AM away from food and vitamins, etc. No recent change to skin, hair, or energy levels.  F/U insomnia - taking Trazodone nightly now. It is helpful.     ROS    Objective:     BP 129/68   Pulse 85   Ht 5\' 6"  (1.676 m)   Wt 234 lb (106.1 kg)   SpO2 95%   BMI 37.77 kg/m    Physical Exam Vitals and nursing note reviewed.  Constitutional:      Appearance: Normal appearance.  HENT:     Head: Normocephalic and atraumatic.  Eyes:     Conjunctiva/sclera: Conjunctivae normal.  Cardiovascular:     Rate and Rhythm: Normal rate and regular rhythm.  Pulmonary:     Effort: Pulmonary effort is normal.     Breath sounds: Normal breath sounds.  Skin:    General: Skin is warm and dry.  Neurological:     Mental Status: She is alert.  Psychiatric:        Mood and Affect: Mood normal.      No results found for any visits on 07/14/23.    The 10-year ASCVD risk score (Arnett DK, et al., 2019) is: 1.6%* (Cholesterol units were assumed)    Assessment & Plan:   Problem List Items Addressed This Visit       Endocrine   Hypothyroidism    Has had some dry skin lately.  Will check TSH and adjust dose if needed.        Relevant Orders   CMP14+EGFR   VITAMIN D 25 Hydroxy (Vit-D Deficiency, Fractures)   TSH + free T4   CBC   Lipid panel   Hemoglobin A1c     Other   Vitamin D deficiency    Recheck Vitamin D.       MDD (major depressive disorder), recurrent episode, mild (HCC) - Primary    Doing well on Cymbalta. Hapyt with current regimen.  PHQ 9 score of 5, and GAD 7 score of 5.         Relevant Orders   CMP14+EGFR   VITAMIN D 25 Hydroxy (Vit-D Deficiency, Fractures)   TSH + free T4   CBC   Lipid panel   Hemoglobin A1c   Insomnia    Continue trazodone nightly.        Hyperlipidemia    Due to recheck lipids.       Relevant Orders   CMP14+EGFR   VITAMIN D 25 Hydroxy (Vit-D Deficiency, Fractures)   TSH + free T4   CBC   Lipid panel   Hemoglobin A1c   Other Visit Diagnoses     Screening mammogram for breast cancer       Relevant Orders   MM 3D SCREENING MAMMOGRAM BILATERAL BREAST   Encounter for immunization       Relevant Orders   Flu vaccine trivalent PF, 6mos and older(Flulaval,Afluria,Fluarix,Fluzone) (Completed)   Abnormal glucose       Relevant Orders   Hemoglobin A1c       Return in about 27  weeks (around 01/19/2024) for Mood.    Nani Gasser, MD

## 2023-07-14 NOTE — Assessment & Plan Note (Signed)
Has had some dry skin lately.  Will check TSH and adjust dose if needed.

## 2023-07-14 NOTE — Assessment & Plan Note (Signed)
Doing well on Cymbalta. Hapyt with current regimen.  PHQ 9 score of 5, and GAD 7 score of 5.

## 2023-07-15 ENCOUNTER — Encounter: Payer: Self-pay | Admitting: Family Medicine

## 2023-07-15 DIAGNOSIS — E118 Type 2 diabetes mellitus with unspecified complications: Secondary | ICD-10-CM | POA: Insufficient documentation

## 2023-07-15 DIAGNOSIS — E1169 Type 2 diabetes mellitus with other specified complication: Secondary | ICD-10-CM | POA: Insufficient documentation

## 2023-07-15 LAB — CBC
Hematocrit: 47.6 % — ABNORMAL HIGH (ref 34.0–46.6)
Hemoglobin: 15.5 g/dL (ref 11.1–15.9)
MCH: 32 pg (ref 26.6–33.0)
MCHC: 32.6 g/dL (ref 31.5–35.7)
MCV: 98 fL — ABNORMAL HIGH (ref 79–97)
Platelets: 300 10*3/uL (ref 150–450)
RBC: 4.85 x10E6/uL (ref 3.77–5.28)
RDW: 12.5 % (ref 11.7–15.4)
WBC: 8.3 10*3/uL (ref 3.4–10.8)

## 2023-07-15 LAB — CMP14+EGFR
ALT: 45 [IU]/L — ABNORMAL HIGH (ref 0–32)
AST: 28 [IU]/L (ref 0–40)
Albumin: 4.1 g/dL (ref 3.8–4.9)
Alkaline Phosphatase: 101 [IU]/L (ref 44–121)
BUN/Creatinine Ratio: 17 (ref 9–23)
BUN: 17 mg/dL (ref 6–24)
Bilirubin Total: 0.8 mg/dL (ref 0.0–1.2)
CO2: 21 mmol/L (ref 20–29)
Calcium: 9.5 mg/dL (ref 8.7–10.2)
Chloride: 100 mmol/L (ref 96–106)
Creatinine, Ser: 1.01 mg/dL — ABNORMAL HIGH (ref 0.57–1.00)
Globulin, Total: 3 g/dL (ref 1.5–4.5)
Glucose: 134 mg/dL — ABNORMAL HIGH (ref 70–99)
Potassium: 4.2 mmol/L (ref 3.5–5.2)
Sodium: 137 mmol/L (ref 134–144)
Total Protein: 7.1 g/dL (ref 6.0–8.5)
eGFR: 67 mL/min/{1.73_m2} (ref >59)

## 2023-07-15 LAB — TSH+FREE T4
Free T4: 1.21 ng/dL (ref 0.82–1.77)
TSH: 5.34 u[IU]/mL — ABNORMAL HIGH (ref 0.450–4.500)

## 2023-07-15 LAB — LIPID PANEL
Chol/HDL Ratio: 4.1 {ratio} (ref 0.0–4.4)
Cholesterol, Total: 215 mg/dL — ABNORMAL HIGH (ref 100–199)
HDL: 52 mg/dL (ref >39)
LDL Chol Calc (NIH): 143 mg/dL — ABNORMAL HIGH (ref 0–99)
Triglycerides: 111 mg/dL (ref 0–149)
VLDL Cholesterol Cal: 20 mg/dL (ref 5–40)

## 2023-07-15 LAB — VITAMIN D 25 HYDROXY (VIT D DEFICIENCY, FRACTURES): Vit D, 25-Hydroxy: 26.4 ng/mL — ABNORMAL LOW (ref 30.0–100.0)

## 2023-07-15 LAB — HEMOGLOBIN A1C
Est. average glucose Bld gHb Est-mCnc: 174 mg/dL
Hgb A1c MFr Bld: 7.7 % — ABNORMAL HIGH (ref 4.8–5.6)

## 2023-07-15 NOTE — Progress Notes (Signed)
Hi Nancy Wang, kidney function is stable at 1.0.  The ALT liver enzyme is still a little elevated similar to 7 months ago.  Just continue to work on healthy diet and regular exercise to reduce the inflammation in your liver.  Your vitamin D is low recommend 25 mcg daily for supplementation especially through the fall winter and early spring months.  Your thyroid level is a little elevated at 5.3 but your free T4 is normal.  Have you missed any doses recently?  Or run out of the medication?  If you been pretty consistent in taking it then I likely need to adjust your dose.  If you have missed some medication recently then we can always recheck it in 8 weeks before we make any changes.  DL cholesterol is elevated at 143 goal is less than 100 continue to work on healthy diet and regular exercise.  Your A1c which checks for diabetes and prediabetes jumped up to 7.7 which i means that you are diabetic.  I would like to get you back in in the next couple of weeks so that we can talk about strategies to reduce your blood glucose levels, and discussed possible medications.  Give [place] a call back so that we can get you scheduled.  In the short-term I would recommend just cutting back on the hydrates like bread, rice, pasta, potatoes, and sweetened foods and beverages.

## 2023-07-18 NOTE — Progress Notes (Signed)
Hi Siri, mammogram showed a questionable area in the right breast so they are recommending further imaging and possibly an ultrasound.  The imaging department should be contacting you soon to get that scheduled.

## 2023-07-19 ENCOUNTER — Other Ambulatory Visit: Payer: Self-pay | Admitting: Family Medicine

## 2023-07-19 DIAGNOSIS — R928 Other abnormal and inconclusive findings on diagnostic imaging of breast: Secondary | ICD-10-CM

## 2023-07-19 DIAGNOSIS — E039 Hypothyroidism, unspecified: Secondary | ICD-10-CM

## 2023-07-19 MED ORDER — LEVOTHYROXINE SODIUM 100 MCG PO TABS
100.0000 ug | ORAL_TABLET | Freq: Every day | ORAL | 0 refills | Status: DC
Start: 2023-07-19 — End: 2023-10-20

## 2023-07-19 NOTE — Progress Notes (Signed)
OK I will bump up your thyroid dose and send over a new rx.

## 2023-07-25 ENCOUNTER — Encounter: Payer: Self-pay | Admitting: Family Medicine

## 2023-07-25 ENCOUNTER — Ambulatory Visit (INDEPENDENT_AMBULATORY_CARE_PROVIDER_SITE_OTHER): Payer: BC Managed Care – PPO | Admitting: Family Medicine

## 2023-07-25 VITALS — BP 117/72 | HR 85 | Ht 66.0 in | Wt 231.0 lb

## 2023-07-25 DIAGNOSIS — E118 Type 2 diabetes mellitus with unspecified complications: Secondary | ICD-10-CM | POA: Diagnosis not present

## 2023-07-25 LAB — POCT UA - MICROALBUMIN
Albumin/Creatinine Ratio, Urine, POC: <30
Creatinine, POC: 200 mg/dL
Microalbumin Ur, POC: 30 mg/L

## 2023-07-25 MED ORDER — DAPAGLIFLOZIN PROPANEDIOL 5 MG PO TABS: 5.0000 mg | ORAL_TABLET | Freq: Every day | ORAL | 2 refills | Status: DC

## 2023-07-25 MED ORDER — LANCET DEVICE MISC
1.0000 | Freq: Three times a day (TID) | 0 refills | Status: AC
Start: 2023-07-25 — End: 2023-08-24

## 2023-07-25 MED ORDER — LANCETS MISC. MISC
1.0000 | Freq: Every day | 1 refills | Status: AC | PRN
Start: 2023-07-25 — End: 2023-08-24

## 2023-07-25 MED ORDER — BLOOD GLUCOSE TEST VI STRP
1.0000 | ORAL_STRIP | Freq: Every day | 1 refills | Status: DC | PRN
Start: 2023-07-25 — End: 2024-04-12

## 2023-07-25 MED ORDER — BLOOD GLUCOSE MONITORING SUPPL DEVI
1.0000 | Freq: Three times a day (TID) | 0 refills | Status: AC
Start: 2023-07-25 — End: ?

## 2023-07-25 NOTE — Progress Notes (Signed)
Established Patient Office Visit  Subjective   Patient ID: Nancy Wang, female    DOB: 09/16/1971  Age: 52 y.o. MRN: [id number]  Chief Complaint  Patient presents with   Medical Management of Chronic Issues    HPI Here for f/u of new dx of diabetes. Here to discuss new dx and treatment      ROS    Objective:     BP 117/72   Pulse 85   Ht 5\' 6"  (1.676 m)   Wt 231 lb (104.8 kg)   SpO2 94%   BMI 37.28 kg/m    Physical Exam Vitals and nursing note reviewed.  Constitutional:      Appearance: Normal appearance.  HENT:     Head: Normocephalic and atraumatic.  Eyes:     Conjunctiva/sclera: Conjunctivae normal.  Cardiovascular:     Rate and Rhythm: Normal rate and regular rhythm.  Pulmonary:     Effort: Pulmonary effort is normal.     Breath sounds: Normal breath sounds.  Skin:    General: Skin is warm and dry.  Neurological:     Mental Status: She is alert.  Psychiatric:        Mood and Affect: Mood normal.      Results for orders placed or performed in visit on 07/25/23  POCT UA - Microalbumin  Result Value Ref Range   Microalbumin Ur, POC 30 mg/L   Creatinine, POC 200 mg/dL   Albumin/Creatinine Ratio, Urine, POC <30       The 10-year ASCVD risk score (Arnett DK, et al., 2019) is: 2.8%    Assessment & Plan:   Problem List Items Addressed This Visit       Endocrine   Controlled diabetes mellitus type 2 with complications (HCC) - Primary    Discussed diagnosis.  Discussed recommendations for lifestyle changes including cutting back on sugars and carbohydrate portions.  Also encouraged regular exercise to help improve glucose levels.  Will also send over prescription for glucometer for her to use to check her sugars a couple of times a week.  Also discussed starting medication especially since her A1c is greater than 7.  She is taken metformin in the past and had side effects so we will start with [place].  Call if any concerns or blood sugar  dropping below 80.  Goal is to get fasting blood sugars under 130 consistently.      Relevant Medications   Blood Glucose Monitoring Suppl DEVI   Glucose Blood (BLOOD GLUCOSE TEST STRIPS) STRP   Lancet Device MISC   Lancets Misc. MISC   dapagliflozin propanediol (FARXIGA) 5 MG TABS tablet   Other Relevant Orders   POCT UA - Microalbumin (Completed)    Return in about 3 months (around 10/25/2023) for Diabetes follow-up.    Nani Gasser, MD

## 2023-07-25 NOTE — Assessment & Plan Note (Signed)
Discussed diagnosis.  Discussed recommendations for lifestyle changes including cutting back on sugars and carbohydrate portions.  Also encouraged regular exercise to help improve glucose levels.  Will also send over prescription for glucometer for her to use to check her sugars a couple of times a week.  Also discussed starting medication especially since her A1c is greater than 7.  She is taken metformin in the past and had side effects so we will start with [place].  Call if any concerns or blood sugar dropping below 80.  Goal is to get fasting blood sugars under 130 consistently.

## 2023-08-03 ENCOUNTER — Ambulatory Visit
Admission: RE | Admit: 2023-08-03 | Discharge: 2023-08-03 | Disposition: A | Discharge status: Home / Self Care | Payer: BC Managed Care – PPO | Source: Ambulatory Visit | Attending: Family Medicine | Admitting: Family Medicine

## 2023-08-03 DIAGNOSIS — R928 Other abnormal and inconclusive findings on diagnostic imaging of breast: Secondary | ICD-10-CM

## 2023-08-03 DIAGNOSIS — N6001 Solitary cyst of right breast: Secondary | ICD-10-CM | POA: Diagnosis not present

## 2023-08-03 NOTE — Progress Notes (Signed)
Please call patient. Normal mammogram.  Repeat in 1 year.  

## 2023-08-09 ENCOUNTER — Other Ambulatory Visit: Payer: Self-pay | Admitting: Family Medicine

## 2023-08-09 DIAGNOSIS — E118 Type 2 diabetes mellitus with unspecified complications: Secondary | ICD-10-CM

## 2023-10-02 ENCOUNTER — Other Ambulatory Visit: Payer: Self-pay | Admitting: Family Medicine

## 2023-10-02 DIAGNOSIS — G4700 Insomnia, unspecified: Secondary | ICD-10-CM

## 2023-10-02 DIAGNOSIS — F321 Major depressive disorder, single episode, moderate: Secondary | ICD-10-CM

## 2023-10-16 ENCOUNTER — Other Ambulatory Visit: Payer: Self-pay | Admitting: Family Medicine

## 2023-10-16 DIAGNOSIS — E039 Hypothyroidism, unspecified: Secondary | ICD-10-CM

## 2023-10-25 ENCOUNTER — Ambulatory Visit: Payer: BC Managed Care – PPO | Admitting: Family Medicine

## 2023-10-25 DIAGNOSIS — R5383 Other fatigue: Secondary | ICD-10-CM | POA: Diagnosis not present

## 2023-10-25 DIAGNOSIS — M25562 Pain in left knee: Secondary | ICD-10-CM | POA: Diagnosis not present

## 2023-10-25 DIAGNOSIS — M79641 Pain in right hand: Secondary | ICD-10-CM | POA: Diagnosis not present

## 2023-10-25 DIAGNOSIS — M1180 Other specified crystal arthropathies, unspecified site: Secondary | ICD-10-CM | POA: Diagnosis not present

## 2023-10-25 DIAGNOSIS — M059 Rheumatoid arthritis with rheumatoid factor, unspecified: Secondary | ICD-10-CM | POA: Diagnosis not present

## 2023-10-25 DIAGNOSIS — M25561 Pain in right knee: Secondary | ICD-10-CM | POA: Diagnosis not present

## 2023-10-25 DIAGNOSIS — M79642 Pain in left hand: Secondary | ICD-10-CM | POA: Diagnosis not present

## 2023-10-25 DIAGNOSIS — M25569 Pain in unspecified knee: Secondary | ICD-10-CM | POA: Diagnosis not present

## 2023-12-08 ENCOUNTER — Telehealth: Payer: Self-pay

## 2023-12-08 DIAGNOSIS — E118 Type 2 diabetes mellitus with unspecified complications: Secondary | ICD-10-CM

## 2023-12-08 MED ORDER — DAPAGLIFLOZIN PROPANEDIOL 5 MG PO TABS: 5.0000 mg | ORAL_TABLET | Freq: Every day | ORAL | 2 refills | Status: DC

## 2023-12-08 NOTE — Telephone Encounter (Signed)
 Copied from CRM [id number]. Topic: Clinical - Prescription Issue >> Dec 07, 2023 11:24 AM Geroge Baseman wrote: Reason for CRM: dapagliflozin propanediol (FARXIGA) 5 MG TABS tablet, patient needs a refill of this medicatiob. She had a lapse in her insurance and is not covered at this time. She will have [employer] healthy blue as of April 1st. She is aware she will have out of pocket costs for this refill. She would like to know if this can be done. Please advise.

## 2023-12-08 NOTE — Telephone Encounter (Signed)
 Task completed. Rx refill for Farxiga sent to the pharmacy.Marland Kitchen

## 2023-12-27 ENCOUNTER — Telehealth: Payer: Self-pay

## 2023-12-27 ENCOUNTER — Other Ambulatory Visit (HOSPITAL_COMMUNITY): Payer: Self-pay

## 2023-12-27 ENCOUNTER — Encounter: Payer: Self-pay | Admitting: Family Medicine

## 2023-12-27 ENCOUNTER — Ambulatory Visit (INDEPENDENT_AMBULATORY_CARE_PROVIDER_SITE_OTHER): Admitting: Family Medicine

## 2023-12-27 ENCOUNTER — Other Ambulatory Visit (HOSPITAL_COMMUNITY)
Admission: RE | Admit: 2023-12-27 | Discharge: 2023-12-27 | Disposition: A | Discharge status: Home / Self Care | Source: Ambulatory Visit | Attending: Family Medicine | Admitting: Family Medicine

## 2023-12-27 ENCOUNTER — Telehealth: Payer: Self-pay | Admitting: Family Medicine

## 2023-12-27 VITALS — BP 136/86 | HR 107 | Temp 98.8°F | Ht 66.0 in | Wt 232.1 lb

## 2023-12-27 DIAGNOSIS — Z124 Encounter for screening for malignant neoplasm of cervix: Secondary | ICD-10-CM | POA: Diagnosis not present

## 2023-12-27 DIAGNOSIS — E118 Type 2 diabetes mellitus with unspecified complications: Secondary | ICD-10-CM | POA: Diagnosis not present

## 2023-12-27 DIAGNOSIS — Z0000 Encounter for general adult medical examination without abnormal findings: Secondary | ICD-10-CM | POA: Insufficient documentation

## 2023-12-27 DIAGNOSIS — Z23 Encounter for immunization: Secondary | ICD-10-CM | POA: Diagnosis not present

## 2023-12-27 DIAGNOSIS — N6342 Unspecified lump in left breast, subareolar: Secondary | ICD-10-CM

## 2023-12-27 LAB — POCT GLYCOSYLATED HEMOGLOBIN (HGB A1C): Hemoglobin A1C: 6.9 % — AB (ref 4.0–5.6)

## 2023-12-27 MED ORDER — OZEMPIC (0.25 OR 0.5 MG/DOSE) 2 MG/3ML ~~LOC~~ SOPN: 0.2500 mg | PEN_INJECTOR | SUBCUTANEOUS | 0 refills | Status: DC

## 2023-12-27 NOTE — Telephone Encounter (Signed)
 My chart note sent.

## 2023-12-27 NOTE — Telephone Encounter (Signed)
 Pharmacy Patient Advocate Encounter   Received notification from CoverMyMeds that prior authorization for Ozempic 0.25 is required/requested.   Insurance verification completed.   The patient is insured through [facility] .   Per test claim: PA required; PA submitted to above mentioned insurance via CoverMyMeds Key/confirmation #/EOC [id number] Status is pending

## 2023-12-27 NOTE — Assessment & Plan Note (Addendum)
 She notes has had persistent vaginal itching on Farxiga. Has been out for 3 weeks and itching has been better. Will do wet prep.  Consider changing to GLP 1 for weight loss benefit.

## 2023-12-27 NOTE — Progress Notes (Signed)
 Complete physical exam  Patient: Nancy Wang   DOB: 06/30/1971   53 y.o. Female  MRN: [id number]  Subjective:    Chief Complaint  Patient presents with   Annual Exam    Nancy Wang is a 53 y.o. female who presents today for a complete physical exam. She reports consuming a general diet. Exercise is limited by orthopedic condition(s): joint pain. She generally feels well.  She does not have additional problems to discuss today.    Most recent fall risk assessment:    12/27/2023    3:14 PM  Fall Risk   Falls in the past year? 0  Number falls in past yr: 0  Injury with Fall? 0  Risk for fall due to : No Fall Risks  Follow up Falls evaluation completed     Most recent depression screenings:    12/27/2023    3:14 PM 07/14/2023    8:40 AM  PHQ 2/9 Scores  PHQ - 2 Score 2 1  PHQ- 9 Score  5         Patient Care Team: Agapito Games, MD as PCP - General (Family Medicine) Zenovia Jordan, MD as Consulting Physician (Rheumatology)   Outpatient Medications Prior to Visit  Medication Sig   Accu-Chek Softclix Lancets lancets CHECK BLOOD SUGAR IN THE MORNING, AT NOON AND AT BEDTIME AS DIRECTED   albuterol (VENTOLIN HFA) 108 (90 Base) MCG/ACT inhaler Inhale 1-2 puffs into the lungs every 6 (six) hours as needed for wheezing or shortness of breath.   Blood Glucose Monitoring Suppl DEVI 1 each by Does not apply route in the morning, at noon, and at bedtime. May substitute to any manufacturer covered by patient's insurance.   cholecalciferol (VITAMIN D3) 25 MCG (1000 UNIT) tablet Take 1,000 Units by mouth daily.   COLCRYS 0.6 MG tablet Take 0.6 mg by mouth daily as needed.   dapagliflozin propanediol (FARXIGA) 5 MG TABS tablet Take 1 tablet (5 mg total) by mouth daily.   DULoxetine (CYMBALTA) 60 MG capsule TAKE 1 CAPSULE BY MOUTH EVERY DAY   folic acid (FOLVITE) 1 MG tablet    levothyroxine (SYNTHROID) 100 MCG tablet TAKE 1 TABLET (100 MCG TOTAL) BY MOUTH DAILY  BEFORE BREAKFAST. NEEDS LABS   methotrexate (RHEUMATREX) 2.5 MG tablet Take 2.5 mg by mouth once a week. Take 9 tablets by mouth once a week   omeprazole (PRILOSEC) 20 MG capsule Take 1 capsule (20 mg total) by mouth daily.   RINVOQ 15 MG TB24 Take 1 tablet by mouth daily.   traZODone (DESYREL) 150 MG tablet TAKE 1 TABLET BY MOUTH EVERYDAY AT BEDTIME   No facility-administered medications prior to visit.    ROS        Objective:     BP 136/86 (BP Location: Left Arm, Patient Position: Sitting, Cuff Size: Normal)   Pulse (!) 107   Temp 98.8 F (37.1 C)   Ht 5\' 6"  (1.676 m)   Wt 232 lb 1.3 oz (105.3 kg)   SpO2 95%   BMI 37.46 kg/m     Physical Exam Exam conducted with a chaperone present.  Constitutional:      Appearance: Normal appearance.  HENT:     Head: Normocephalic and atraumatic.     Right Ear: Tympanic membrane, ear canal and external ear normal.     Left Ear: Tympanic membrane, ear canal and external ear normal.     Nose: Nose normal.     Mouth/Throat:  Pharynx: Oropharynx is clear.  Eyes:     Extraocular Movements: Extraocular movements intact.     Conjunctiva/sclera: Conjunctivae normal.     Pupils: Pupils are equal, round, and reactive to light.  Neck:     Thyroid: No thyromegaly.  Cardiovascular:     Rate and Rhythm: Normal rate and regular rhythm.  Pulmonary:     Effort: Pulmonary effort is normal.     Breath sounds: Normal breath sounds.  Chest:     Chest wall: No mass or deformity.  Breasts:    Right: Normal. No mass, nipple discharge or skin change.     Left: Normal. No mass, nipple discharge or skin change.       Comments: More firm area at nipple on the left breast.  Abdominal:     General: Bowel sounds are normal.     Palpations: Abdomen is soft.     Tenderness: There is no abdominal tenderness.  Genitourinary:    General: Normal vulva.     Exam position: Supine.     Pubic Area: No rash.      Labia:        Right: No rash, lesion  or injury.        Left: No rash, lesion or injury.      Vagina: Normal.     Cervix: Normal.     Uterus: Normal.      Adnexa: Right adnexa normal and left adnexa normal.     Rectum: Normal.  Musculoskeletal:        General: No swelling.     Cervical back: Neck supple.  Lymphadenopathy:     Upper Body:     Right upper body: No supraclavicular, axillary or pectoral adenopathy.     Left upper body: No supraclavicular, axillary or pectoral adenopathy.  Skin:    General: Skin is warm and dry.  Neurological:     Mental Status: She is oriented to person, place, and time.  Psychiatric:        Mood and Affect: Mood normal.        Behavior: Behavior normal.      Results for orders placed or performed in visit on 12/27/23  POCT HgB A1C  Result Value Ref Range   Hemoglobin A1C 6.9 (A) 4.0 - 5.6 %   HbA1c POC (<> result, manual entry)     HbA1c, POC (prediabetic range)     HbA1c, POC (controlled diabetic range)          Assessment & Plan:    Routine Health Maintenance and Physical Exam  Immunization History  Administered Date(s) Administered   Influenza Split 06/16/2020   Influenza Whole 05/18/2011   Influenza, Seasonal, Injecte, Preservative Fre 07/14/2023   Influenza,inj,Quad PF,6+ Mos 06/07/2014, 06/19/2015, 08/30/2017, 05/16/2018, 07/19/2019, 07/09/2021, 07/12/2022   Influenza-Unspecified 07/04/2013, 06/29/2016   Moderna Sars-Covid-2 Vaccination 11/26/2019, 12/31/2019, 09/16/2020   PNEUMOCOCCAL CONJUGATE-20 12/27/2023   Tdap 08/02/2013, 12/27/2023   Zoster Recombinant(Shingrix) 07/12/2022, 01/12/2023    Health Maintenance  Topic Date Due   OPHTHALMOLOGY EXAM  Never done   HIV Screening  Never done   COVID-19 Vaccine (4 - 2024-25 season) 05/29/2023   INFLUENZA VACCINE  04/27/2024   HEMOGLOBIN A1C  06/27/2024   Diabetic kidney evaluation - eGFR measurement  07/13/2024   Diabetic kidney evaluation - Urine ACR  07/24/2024   FOOT EXAM  07/24/2024   Cervical Cancer  Screening (HPV/Pap Cotest)  02/04/2025   MAMMOGRAM  07/13/2025   Colonoscopy  10/03/2031   DTaP/Tdap/Td (  3 - Td or Tdap) 12/26/2033   Pneumococcal Vaccine 30-61 Years old  Completed   Hepatitis C Screening  Completed   Zoster Vaccines- Shingrix  Completed   HPV VACCINES  Aged Out    Discussed health benefits of physical activity, and encouraged her to engage in regular exercise appropriate for her age and condition.  Problem List Items Addressed This Visit       Endocrine   Controlled diabetes mellitus type 2 with complications [facility])   She notes has had persistent vaginal itching on Farxiga. Has been out for 3 weeks and itching has been better. Will do wet prep.  Consider changing to GLP 1 for weight loss benefit.       Relevant Medications   Semaglutide,0.25 or 0.5MG /DOS, (OZEMPIC, 0.25 OR 0.5 MG/DOSE,) 2 MG/3ML SOPN   Other Relevant Orders   POCT HgB A1C (Completed)   Other Visit Diagnoses       Wellness examination    -  Primary   Relevant Orders   Cytology - PAP   CMP14+EGFR   CBC with Differential/Platelet   VITAMIN D 25 Hydroxy (Vit-D Deficiency, Fractures)   GC/Chlamydia Probe Amp([facility])   Tdap vaccine greater than or equal to 7yo IM (Completed)   Pneumococcal conjugate vaccine 20-valent (Prevnar 20) (Completed)     Cervical cancer screening       Relevant Orders   Cytology - PAP   GC/Chlamydia Probe Amp([facility])     Immunization due       Relevant Orders   Tdap vaccine greater than or equal to 7yo IM (Completed)   Pneumococcal conjugate vaccine 20-valent (Prevnar 20) (Completed)      Firm area under the left breast, will check prior mammo reports.   Keep up a regular exercise program and make sure you are eating a healthy diet Try to eat 4 servings of dairy a day, or if you are lactose intolerant take a calcium with vitamin D daily.  Your vaccines are up to date.   Return in about 4 months (around 04/27/2024) for Diabetes follow-up.     Nani Gasser, MD

## 2023-12-28 ENCOUNTER — Other Ambulatory Visit (HOSPITAL_COMMUNITY): Payer: Self-pay

## 2023-12-28 ENCOUNTER — Encounter: Payer: Self-pay | Admitting: Family Medicine

## 2023-12-28 LAB — CBC WITH DIFFERENTIAL/PLATELET
Basophils Absolute: 0.1 10*3/uL (ref 0.0–0.2)
Basos: 1 %
EOS (ABSOLUTE): 0.1 10*3/uL (ref 0.0–0.4)
Eos: 1 %
Hematocrit: 44.4 % (ref 34.0–46.6)
Hemoglobin: 15.1 g/dL (ref 11.1–15.9)
Immature Grans (Abs): 0 10*3/uL (ref 0.0–0.1)
Immature Granulocytes: 0 %
Lymphocytes Absolute: 2.3 10*3/uL (ref 0.7–3.1)
Lymphs: 24 %
MCH: 32.1 pg (ref 26.6–33.0)
MCHC: 34 g/dL (ref 31.5–35.7)
MCV: 94 fL (ref 79–97)
Monocytes Absolute: 0.8 10*3/uL (ref 0.1–0.9)
Monocytes: 8 %
Neutrophils Absolute: 6.6 10*3/uL (ref 1.4–7.0)
Neutrophils: 66 %
Platelets: 377 10*3/uL (ref 150–450)
RBC: 4.71 x10E6/uL (ref 3.77–5.28)
RDW: 12.5 % (ref 11.7–15.4)
WBC: 9.9 10*3/uL (ref 3.4–10.8)

## 2023-12-28 LAB — CMP14+EGFR
ALT: 30 IU/L (ref 0–32)
AST: 21 IU/L (ref 0–40)
Albumin: 4.1 g/dL (ref 3.8–4.9)
Alkaline Phosphatase: 118 IU/L (ref 44–121)
BUN/Creatinine Ratio: 16 (ref 9–23)
BUN: 15 mg/dL (ref 6–24)
Bilirubin Total: 0.6 mg/dL (ref 0.0–1.2)
CO2: 24 mmol/L (ref 20–29)
Calcium: 9 mg/dL (ref 8.7–10.2)
Chloride: 100 mmol/L (ref 96–106)
Creatinine, Ser: 0.94 mg/dL (ref 0.57–1.00)
Globulin, Total: 2.6 g/dL (ref 1.5–4.5)
Glucose: 114 mg/dL — ABNORMAL HIGH (ref 70–99)
Potassium: 4.5 mmol/L (ref 3.5–5.2)
Sodium: 137 mmol/L (ref 134–144)
Total Protein: 6.7 g/dL (ref 6.0–8.5)
eGFR: 73 mL/min/{1.73_m2} (ref >59)

## 2023-12-28 LAB — VITAMIN D 25 HYDROXY (VIT D DEFICIENCY, FRACTURES): Vit D, 25-Hydroxy: 26.5 ng/mL — ABNORMAL LOW (ref 30.0–100.0)

## 2023-12-28 NOTE — Telephone Encounter (Signed)
 Pharmacy Patient Advocate Encounter  Received notification from [facility] that Prior Authorization for Ozempic 0.25 has been APPROVED from 12/27/23 to 12/26/24 with QUANTITY LIMIT of 9 for a 84 day supply. Pt co-pay is $4.00.   PA #/Case ID/Reference #: [id number]

## 2023-12-28 NOTE — Addendum Note (Signed)
 Addended by: Nani Gasser D on: 12/28/2023 07:25 PM   Modules accepted: Orders

## 2023-12-29 ENCOUNTER — Encounter: Payer: Self-pay | Admitting: Family Medicine

## 2023-12-30 ENCOUNTER — Encounter: Payer: Self-pay | Admitting: Family Medicine

## 2023-12-30 NOTE — Progress Notes (Signed)
 Hi Nancy Wang, metabolic panel looks good.  Kidney function actually looks a little bit better and your kidney function looks better this time.  Vitamin D still low so please make sure you are taking 25 mcg daily.  Blood count normal.

## 2023-12-30 NOTE — Telephone Encounter (Signed)
 Mychart message sent by pt:  Nancy Wang [facility] (supporting Agapito Games, MD)14 minutes ago (9:58 AM)    I wanted to double check on my diabetes meds. Am I just doing the ozempic or ozempic and farxiga? Also, can you send in a new prescription for omeprazole? Thank you     Dr. Linford Arnold, please advise on this for pt.

## 2023-12-31 LAB — GC/CHLAMYDIA PROBE AMP
Chlamydia trachomatis, NAA: NEGATIVE
Neisseria Gonorrhoeae by PCR: NEGATIVE

## 2024-01-02 LAB — CYTOLOGY - PAP
Chlamydia: NEGATIVE
Comment: NEGATIVE
Comment: NEGATIVE
Comment: NEGATIVE
Comment: NORMAL
Diagnosis: NEGATIVE
Diagnosis: REACTIVE
High risk HPV: NEGATIVE
Neisseria Gonorrhea: NEGATIVE
Trichomonas: NEGATIVE

## 2024-01-02 NOTE — Progress Notes (Signed)
 HI Nancy Wang, swab did gonorrhea chlamydia and trichomoniasis but did not do yeast or BV.  Are we able to call them and see if we can add this on?

## 2024-01-03 ENCOUNTER — Telehealth: Payer: Self-pay

## 2024-01-03 ENCOUNTER — Other Ambulatory Visit (HOSPITAL_COMMUNITY): Payer: Self-pay

## 2024-01-03 ENCOUNTER — Telehealth: Payer: Self-pay | Admitting: Family Medicine

## 2024-01-03 LAB — NUSWAB BV AND CANDIDA, NAA

## 2024-01-03 LAB — SPECIMEN STATUS REPORT

## 2024-01-03 NOTE — Progress Notes (Signed)
 It looks like they canceled the test for the BV yeast etc. on the swab.  I think at this point we will probably just have to call the patient back and have her do a wet prep.

## 2024-01-03 NOTE — Telephone Encounter (Signed)
 Pharmacy Patient Advocate Encounter  Received notification from [facility] that Prior Authorization for Farxiga 5 has been APPROVED from 01/03/24 to 01/02/25. Ran test claim, Copay is $4.00. This test claim was processed through [facility]- copay amounts may vary at other pharmacies due to pharmacy/plan contracts, or as the patient moves through the different stages of their insurance plan.   PA #/Case ID/Reference #: [id number]

## 2024-01-03 NOTE — Telephone Encounter (Signed)
 Copied from CRM [id number]. Topic: Referral - Prior Authorization Question >> Jan 03, 2024  8:17 AM Hamdi H wrote: Reason for CRM: Patient called in asking for prior authorization to be submitted for dapagliflozin propanediol (FARXIGA) 5 MG TABS tablet. Please give patient a call back when this is completed.

## 2024-01-03 NOTE — Telephone Encounter (Signed)
 Pharmacy Patient Advocate Encounter   Received notification from Pt Calls Messages that prior authorization for Farxiga 5 is required/requested.   Insurance verification completed.   The patient is insured through [facility] .   Per test claim: PA required; PA submitted to above mentioned insurance via CoverMyMeds Key/confirmation #/EOC [id number] Status is pending

## 2024-01-04 DIAGNOSIS — Z79899 Other long term (current) drug therapy: Secondary | ICD-10-CM | POA: Diagnosis not present

## 2024-01-04 DIAGNOSIS — R5383 Other fatigue: Secondary | ICD-10-CM | POA: Diagnosis not present

## 2024-01-04 DIAGNOSIS — M1180 Other specified crystal arthropathies, unspecified site: Secondary | ICD-10-CM | POA: Diagnosis not present

## 2024-01-04 DIAGNOSIS — M059 Rheumatoid arthritis with rheumatoid factor, unspecified: Secondary | ICD-10-CM | POA: Diagnosis not present

## 2024-01-04 DIAGNOSIS — M25569 Pain in unspecified knee: Secondary | ICD-10-CM | POA: Diagnosis not present

## 2024-01-04 MED ORDER — FLUCONAZOLE 150 MG PO TABS: 150.0000 mg | ORAL_TABLET | Freq: Once | ORAL | 1 refills | Status: AC

## 2024-01-04 NOTE — Progress Notes (Signed)
 Yeast positive, so will send over diflucan.

## 2024-01-04 NOTE — Addendum Note (Signed)
 Addended by: Nani Gasser D on: 01/04/2024 03:26 PM   Modules accepted: Orders

## 2024-01-06 ENCOUNTER — Telehealth: Payer: Self-pay

## 2024-01-06 ENCOUNTER — Other Ambulatory Visit (HOSPITAL_COMMUNITY): Payer: Self-pay

## 2024-01-06 NOTE — Telephone Encounter (Signed)
 Per test claim, medication has been filled 01/04/2024. Next refill date 01/27/2024

## 2024-01-06 NOTE — Telephone Encounter (Signed)
 ERROR

## 2024-01-07 ENCOUNTER — Other Ambulatory Visit: Payer: Self-pay | Admitting: Family Medicine

## 2024-01-07 DIAGNOSIS — G4700 Insomnia, unspecified: Secondary | ICD-10-CM

## 2024-01-07 DIAGNOSIS — F321 Major depressive disorder, single episode, moderate: Secondary | ICD-10-CM

## 2024-01-12 ENCOUNTER — Encounter: Admitting: Family Medicine

## 2024-01-15 ENCOUNTER — Other Ambulatory Visit: Payer: Self-pay | Admitting: Family Medicine

## 2024-01-15 DIAGNOSIS — E039 Hypothyroidism, unspecified: Secondary | ICD-10-CM

## 2024-01-19 ENCOUNTER — Ambulatory Visit: Payer: BC Managed Care – PPO | Admitting: Family Medicine

## 2024-01-23 ENCOUNTER — Other Ambulatory Visit: Payer: Self-pay | Admitting: Family Medicine

## 2024-01-23 DIAGNOSIS — E118 Type 2 diabetes mellitus with unspecified complications: Secondary | ICD-10-CM

## 2024-01-23 LAB — SPECIMEN STATUS REPORT

## 2024-01-23 LAB — NUSWAB VAGINITIS (VG)
Candida albicans, NAA: POSITIVE — AB
Candida glabrata, NAA: NEGATIVE
Trich vag by NAA: NEGATIVE

## 2024-02-01 ENCOUNTER — Ambulatory Visit
Admission: RE | Admit: 2024-02-01 | Discharge: 2024-02-01 | Disposition: A | Discharge status: Home / Self Care | Source: Ambulatory Visit | Attending: Family Medicine | Admitting: Family Medicine

## 2024-02-01 DIAGNOSIS — N6342 Unspecified lump in left breast, subareolar: Secondary | ICD-10-CM

## 2024-02-01 DIAGNOSIS — N6325 Unspecified lump in the left breast, overlapping quadrants: Secondary | ICD-10-CM | POA: Diagnosis not present

## 2024-02-01 DIAGNOSIS — N6002 Solitary cyst of left breast: Secondary | ICD-10-CM | POA: Diagnosis not present

## 2024-02-15 DIAGNOSIS — M059 Rheumatoid arthritis with rheumatoid factor, unspecified: Secondary | ICD-10-CM | POA: Diagnosis not present

## 2024-02-15 DIAGNOSIS — Z79899 Other long term (current) drug therapy: Secondary | ICD-10-CM | POA: Diagnosis not present

## 2024-02-15 DIAGNOSIS — M25569 Pain in unspecified knee: Secondary | ICD-10-CM | POA: Diagnosis not present

## 2024-02-15 DIAGNOSIS — R5383 Other fatigue: Secondary | ICD-10-CM | POA: Diagnosis not present

## 2024-02-15 DIAGNOSIS — M1180 Other specified crystal arthropathies, unspecified site: Secondary | ICD-10-CM | POA: Diagnosis not present

## 2024-02-23 ENCOUNTER — Other Ambulatory Visit: Payer: Self-pay | Admitting: Family Medicine

## 2024-02-23 DIAGNOSIS — E118 Type 2 diabetes mellitus with unspecified complications: Secondary | ICD-10-CM

## 2024-03-20 DIAGNOSIS — Z79899 Other long term (current) drug therapy: Secondary | ICD-10-CM | POA: Diagnosis not present

## 2024-03-20 DIAGNOSIS — M059 Rheumatoid arthritis with rheumatoid factor, unspecified: Secondary | ICD-10-CM | POA: Diagnosis not present

## 2024-03-20 DIAGNOSIS — M25569 Pain in unspecified knee: Secondary | ICD-10-CM | POA: Diagnosis not present

## 2024-03-20 DIAGNOSIS — R5383 Other fatigue: Secondary | ICD-10-CM | POA: Diagnosis not present

## 2024-03-20 DIAGNOSIS — M1180 Other specified crystal arthropathies, unspecified site: Secondary | ICD-10-CM | POA: Diagnosis not present

## 2024-03-20 LAB — LAB REPORT - SCANNED: EGFR: 61

## 2024-03-27 DIAGNOSIS — R000 Tachycardia, unspecified: Secondary | ICD-10-CM | POA: Diagnosis not present

## 2024-03-27 DIAGNOSIS — R7989 Other specified abnormal findings of blood chemistry: Secondary | ICD-10-CM | POA: Diagnosis not present

## 2024-03-27 DIAGNOSIS — Z7952 Long term (current) use of systemic steroids: Secondary | ICD-10-CM | POA: Diagnosis not present

## 2024-03-27 DIAGNOSIS — D84821 Immunodeficiency due to drugs: Secondary | ICD-10-CM | POA: Diagnosis not present

## 2024-03-27 DIAGNOSIS — E860 Dehydration: Secondary | ICD-10-CM | POA: Diagnosis not present

## 2024-03-27 DIAGNOSIS — Z7985 Long-term (current) use of injectable non-insulin antidiabetic drugs: Secondary | ICD-10-CM | POA: Diagnosis not present

## 2024-03-27 DIAGNOSIS — R410 Disorientation, unspecified: Secondary | ICD-10-CM | POA: Diagnosis not present

## 2024-03-27 DIAGNOSIS — M069 Rheumatoid arthritis, unspecified: Secondary | ICD-10-CM | POA: Diagnosis not present

## 2024-03-27 DIAGNOSIS — R0902 Hypoxemia: Secondary | ICD-10-CM | POA: Diagnosis not present

## 2024-03-27 DIAGNOSIS — Z79631 Long term (current) use of antimetabolite agent: Secondary | ICD-10-CM | POA: Diagnosis not present

## 2024-03-27 DIAGNOSIS — K429 Umbilical hernia without obstruction or gangrene: Secondary | ICD-10-CM | POA: Diagnosis not present

## 2024-03-27 DIAGNOSIS — R6510 Systemic inflammatory response syndrome (SIRS) of non-infectious origin without acute organ dysfunction: Secondary | ICD-10-CM | POA: Diagnosis not present

## 2024-03-27 DIAGNOSIS — Z1152 Encounter for screening for COVID-19: Secondary | ICD-10-CM | POA: Diagnosis not present

## 2024-03-27 DIAGNOSIS — E039 Hypothyroidism, unspecified: Secondary | ICD-10-CM | POA: Diagnosis not present

## 2024-03-27 DIAGNOSIS — Z79899 Other long term (current) drug therapy: Secondary | ICD-10-CM | POA: Diagnosis not present

## 2024-03-27 DIAGNOSIS — D849 Immunodeficiency, unspecified: Secondary | ICD-10-CM | POA: Diagnosis not present

## 2024-03-27 DIAGNOSIS — R944 Abnormal results of kidney function studies: Secondary | ICD-10-CM | POA: Diagnosis not present

## 2024-03-27 DIAGNOSIS — E1165 Type 2 diabetes mellitus with hyperglycemia: Secondary | ICD-10-CM | POA: Diagnosis not present

## 2024-03-27 DIAGNOSIS — I959 Hypotension, unspecified: Secondary | ICD-10-CM | POA: Diagnosis not present

## 2024-03-27 DIAGNOSIS — E872 Acidosis, unspecified: Secondary | ICD-10-CM | POA: Diagnosis not present

## 2024-03-27 DIAGNOSIS — Z7984 Long term (current) use of oral hypoglycemic drugs: Secondary | ICD-10-CM | POA: Diagnosis not present

## 2024-03-27 DIAGNOSIS — Z79890 Hormone replacement therapy (postmenopausal): Secondary | ICD-10-CM | POA: Diagnosis not present

## 2024-03-27 DIAGNOSIS — Z6837 Body mass index (BMI) 37.0-37.9, adult: Secondary | ICD-10-CM | POA: Diagnosis not present

## 2024-03-27 DIAGNOSIS — F419 Anxiety disorder, unspecified: Secondary | ICD-10-CM | POA: Diagnosis not present

## 2024-03-27 DIAGNOSIS — K6389 Other specified diseases of intestine: Secondary | ICD-10-CM | POA: Diagnosis not present

## 2024-03-27 DIAGNOSIS — T782XXA Anaphylactic shock, unspecified, initial encounter: Secondary | ICD-10-CM | POA: Diagnosis not present

## 2024-03-27 DIAGNOSIS — R0602 Shortness of breath: Secondary | ICD-10-CM | POA: Diagnosis not present

## 2024-03-27 DIAGNOSIS — F32A Depression, unspecified: Secondary | ICD-10-CM | POA: Diagnosis not present

## 2024-03-27 DIAGNOSIS — Z6835 Body mass index (BMI) 35.0-35.9, adult: Secondary | ICD-10-CM | POA: Diagnosis not present

## 2024-03-27 DIAGNOSIS — R55 Syncope and collapse: Secondary | ICD-10-CM | POA: Diagnosis not present

## 2024-03-27 DIAGNOSIS — A419 Sepsis, unspecified organism: Secondary | ICD-10-CM | POA: Diagnosis not present

## 2024-03-27 DIAGNOSIS — Z888 Allergy status to other drugs, medicaments and biological substances status: Secondary | ICD-10-CM | POA: Diagnosis not present

## 2024-03-27 DIAGNOSIS — K760 Fatty (change of) liver, not elsewhere classified: Secondary | ICD-10-CM | POA: Diagnosis not present

## 2024-03-27 DIAGNOSIS — R231 Pallor: Secondary | ICD-10-CM | POA: Diagnosis not present

## 2024-03-27 DIAGNOSIS — R509 Fever, unspecified: Secondary | ICD-10-CM | POA: Diagnosis not present

## 2024-03-27 DIAGNOSIS — R4182 Altered mental status, unspecified: Secondary | ICD-10-CM | POA: Diagnosis not present

## 2024-03-27 DIAGNOSIS — Z8739 Personal history of other diseases of the musculoskeletal system and connective tissue: Secondary | ICD-10-CM | POA: Diagnosis not present

## 2024-03-27 DIAGNOSIS — K529 Noninfective gastroenteritis and colitis, unspecified: Secondary | ICD-10-CM | POA: Diagnosis not present

## 2024-03-27 DIAGNOSIS — J9811 Atelectasis: Secondary | ICD-10-CM | POA: Diagnosis not present

## 2024-03-27 DIAGNOSIS — R404 Transient alteration of awareness: Secondary | ICD-10-CM | POA: Diagnosis not present

## 2024-03-27 DIAGNOSIS — Z885 Allergy status to narcotic agent status: Secondary | ICD-10-CM | POA: Diagnosis not present

## 2024-03-28 DIAGNOSIS — R55 Syncope and collapse: Secondary | ICD-10-CM | POA: Diagnosis not present

## 2024-03-28 DIAGNOSIS — A09 Infectious gastroenteritis and colitis, unspecified: Secondary | ICD-10-CM | POA: Diagnosis not present

## 2024-03-29 DIAGNOSIS — A09 Infectious gastroenteritis and colitis, unspecified: Secondary | ICD-10-CM | POA: Diagnosis not present

## 2024-03-30 DIAGNOSIS — F32A Depression, unspecified: Secondary | ICD-10-CM | POA: Diagnosis not present

## 2024-03-30 DIAGNOSIS — A09 Infectious gastroenteritis and colitis, unspecified: Secondary | ICD-10-CM | POA: Diagnosis not present

## 2024-03-30 DIAGNOSIS — E039 Hypothyroidism, unspecified: Secondary | ICD-10-CM | POA: Diagnosis not present

## 2024-03-30 DIAGNOSIS — Z8739 Personal history of other diseases of the musculoskeletal system and connective tissue: Secondary | ICD-10-CM | POA: Diagnosis not present

## 2024-03-30 DIAGNOSIS — D849 Immunodeficiency, unspecified: Secondary | ICD-10-CM | POA: Diagnosis not present

## 2024-03-30 DIAGNOSIS — R7989 Other specified abnormal findings of blood chemistry: Secondary | ICD-10-CM | POA: Diagnosis not present

## 2024-03-30 DIAGNOSIS — F419 Anxiety disorder, unspecified: Secondary | ICD-10-CM | POA: Diagnosis not present

## 2024-03-31 DIAGNOSIS — E039 Hypothyroidism, unspecified: Secondary | ICD-10-CM | POA: Diagnosis not present

## 2024-03-31 DIAGNOSIS — F419 Anxiety disorder, unspecified: Secondary | ICD-10-CM | POA: Diagnosis not present

## 2024-03-31 DIAGNOSIS — R7989 Other specified abnormal findings of blood chemistry: Secondary | ICD-10-CM | POA: Diagnosis not present

## 2024-03-31 DIAGNOSIS — Z8739 Personal history of other diseases of the musculoskeletal system and connective tissue: Secondary | ICD-10-CM | POA: Diagnosis not present

## 2024-03-31 DIAGNOSIS — F32A Depression, unspecified: Secondary | ICD-10-CM | POA: Diagnosis not present

## 2024-03-31 DIAGNOSIS — D849 Immunodeficiency, unspecified: Secondary | ICD-10-CM | POA: Diagnosis not present

## 2024-03-31 DIAGNOSIS — A09 Infectious gastroenteritis and colitis, unspecified: Secondary | ICD-10-CM | POA: Diagnosis not present

## 2024-04-01 DIAGNOSIS — A09 Infectious gastroenteritis and colitis, unspecified: Secondary | ICD-10-CM | POA: Diagnosis not present

## 2024-04-01 DIAGNOSIS — R7989 Other specified abnormal findings of blood chemistry: Secondary | ICD-10-CM | POA: Diagnosis not present

## 2024-04-01 DIAGNOSIS — F419 Anxiety disorder, unspecified: Secondary | ICD-10-CM | POA: Diagnosis not present

## 2024-04-01 DIAGNOSIS — Z8739 Personal history of other diseases of the musculoskeletal system and connective tissue: Secondary | ICD-10-CM | POA: Diagnosis not present

## 2024-04-01 DIAGNOSIS — D849 Immunodeficiency, unspecified: Secondary | ICD-10-CM | POA: Diagnosis not present

## 2024-04-01 DIAGNOSIS — E039 Hypothyroidism, unspecified: Secondary | ICD-10-CM | POA: Diagnosis not present

## 2024-04-01 DIAGNOSIS — F32A Depression, unspecified: Secondary | ICD-10-CM | POA: Diagnosis not present

## 2024-04-02 DIAGNOSIS — R6510 Systemic inflammatory response syndrome (SIRS) of non-infectious origin without acute organ dysfunction: Secondary | ICD-10-CM | POA: Diagnosis not present

## 2024-04-02 DIAGNOSIS — A09 Infectious gastroenteritis and colitis, unspecified: Secondary | ICD-10-CM | POA: Diagnosis not present

## 2024-04-03 ENCOUNTER — Telehealth: Payer: Self-pay

## 2024-04-03 ENCOUNTER — Other Ambulatory Visit: Payer: Self-pay | Admitting: Family Medicine

## 2024-04-03 DIAGNOSIS — K219 Gastro-esophageal reflux disease without esophagitis: Secondary | ICD-10-CM

## 2024-04-03 MED ORDER — OMEPRAZOLE 20 MG PO CPDR: 20.0000 mg | DELAYED_RELEASE_CAPSULE | Freq: Every day | ORAL | 3 refills | Status: AC

## 2024-04-03 NOTE — Transitions of Care (Post Inpatient/ED Visit) (Signed)
 04/03/2024  Name: Nancy Wang MRN: [id number] DOB: 05/13/1971  Today's TOC FU Call Status: Today's TOC FU Call Status:: Successful TOC FU Call Completed TOC FU Call Complete Date: 04/03/24 Patient's Name and Date of Birth confirmed.  Transition Care Management Follow-up Telephone Call Date of Discharge: 04/02/24 Discharge Facility: Other [facility]) Name of Other (Non[facility]) Discharge Facility: [facility] Type of Discharge: Inpatient Admission Primary Inpatient Discharge Diagnosis:: SIRS (systemic inflammatory response syndrome) How have you been since you were released from the hospital?: Better Any questions or concerns?: No  Items Reviewed: Did you receive and understand the discharge instructions provided?: Yes Medications obtained,verified, and reconciled?: Yes (Medications Reviewed) Any new allergies since your discharge?: No Dietary orders reviewed?: Yes Type of Diet Ordered:: Patient states she has been trying to eat better with diabetic diet Do you have support at home?: Yes People in Home [RPT]: child(ren), dependent, parent(s) Name of Support/Comfort Primary Source: Patient lives in a house with her 3 year old son and mother who is able to help as needed  Medications Reviewed Today: Medications Reviewed Today     Reviewed by Lauro Shona LABOR, RN (Registered Nurse) on 04/03/24 at 1023  Med List Status: <None>   Medication Order Taking? Sig Documenting Provider Last Dose Status Informant  Accu-Chek Softclix Lancets lancets [id number] Yes CHECK BLOOD SUGAR IN THE MORNING, AT NOON AND AT BEDTIME AS DIRECTED Alvan Dorothyann BIRCH, MD  Active   albuterol  (VENTOLIN  HFA) 108 (90 Base) MCG/ACT inhaler [id number] Yes Inhale 1-2 puffs into the lungs every 6 (six) hours as needed for wheezing or shortness of breath. Ward, Jessica Z, PA-C  Active   Blood Glucose Monitoring Suppl DEVI [id number] Yes 1 each by Does not apply route in the morning, at noon,  and at bedtime. May substitute to any manufacturer covered by patient's insurance. Alvan Dorothyann BIRCH, MD  Active   BuPROPion  HBr 174 MG TB24 [id number] Yes Take 1 tablet by mouth daily. [provider]  Active   cholecalciferol (VITAMIN D3) 25 MCG (1000 UNIT) tablet [id number] Yes Take 1,000 Units by mouth daily. [provider]  Active   ciprofloxacin (CIPRO) 500 MG tablet [id number] Yes Take 500 mg by mouth 2 (two) times daily. [provider]  Active   COLCRYS 0.6 MG tablet [id number] Yes Take 0.6 mg by mouth daily as needed. [provider]  Active   dapagliflozin  propanediol (FARXIGA ) 5 MG TABS tablet [id number] Yes Take 1 tablet (5 mg total) by mouth daily. Alvan Dorothyann BIRCH, MD  Active   DULoxetine  (CYMBALTA ) 60 MG capsule [id number] Yes TAKE 1 CAPSULE BY MOUTH EVERY DAY Alvan Dorothyann BIRCH, MD  Active   folic acid (FOLVITE) 1 MG tablet [id number] Yes  [provider]  Active            Med Note JEANNA, BASCOM CROME   Tue Feb 05, 2020  2:10 PM)    HYDROcodone -acetaminophen  (NORCO/VICODIN) 5-325 MG tablet [id number] Yes Take 1 tablet by mouth every 6 (six) hours as needed for moderate pain (pain score 4-6). [provider]  Active   levothyroxine  (SYNTHROID ) 100 MCG tablet [id number] Yes TAKE 1 TABLET (100 MCG TOTAL) BY MOUTH DAILY BEFORE BREAKFAST. NEEDS LABS Alvan Dorothyann BIRCH, MD  Active   methotrexate  [facility]) 2.5 MG tablet [id number] Yes Take 2.5 mg by mouth once a week. Take 9 tablets by mouth once a week [provider]  Active Self  Med Note JEANNA, BASCOM CROME   Tue Feb 05, 2020  2:10 PM)    metroNIDAZOLE (FLAGYL) 500 MG tablet [id number] Yes Take 500 mg by mouth 3 (three) times daily. [provider]  Active   metroNIDAZOLE (METROCREAM) 0.75 % cream [id number] Yes Apply 1 Application topically daily. [provider]  Active   omeprazole  (PRILOSEC) 20 MG capsule [id number] Yes Take 1 capsule (20  mg total) by mouth daily. Alvan Dorothyann BIRCH, MD  Active   predniSONE  (DELTASONE ) 10 MG tablet [id number] Yes Take 10 mg by mouth daily. [provider]  Active   RINVOQ 15 MG [id number] Yes Take 1 tablet by mouth daily. [provider]  Active   Semaglutide ,0.25 or 0.5MG /DOS, (OZEMPIC , 0.25 OR 0.5 MG/DOSE,) 2 MG/3ML SOPN [id number] Yes INJECT 0.25 MG SUBCUTANEOUSLY ONE TIME PER WEEK Breeback, Jade L, PA-C  Active   traZODone  (DESYREL ) 150 MG tablet [id number] Yes TAKE 1 TABLET BY MOUTH EVERYDAY AT BEDTIME Alvan Dorothyann BIRCH, MD  Active   Med List Note Annie Shona LABOR, RN 04/03/24 1019): Patient reports she takes daily as needed for R.A flare            Home Care and Equipment/Supplies: Were Home Health Services Ordered?: No Any new equipment or medical supplies ordered?: No  Functional Questionnaire: Do you need assistance with bathing/showering or dressing?: No Do you need assistance with meal preparation?: No Do you need assistance with eating?: No Do you have difficulty maintaining continence: No Do you need assistance with getting out of bed/getting out of a chair/moving?: No Do you have difficulty managing or taking your medications?: No  Follow up appointments reviewed: PCP Follow-up appointment confirmed?: Yes Date of PCP follow-up appointment?: 04/09/24 Follow-up Provider: Dorothyann Alvan Specialist [facility] Follow-up appointment confirmed?: NA (Patient states she had colonoscopy with Dr Lindaann a couple years ago and states she is going to follow up with in a couple of months - states she has not needed but states she has now been diagnosed with colitis - patient will schedule) Do you need transportation to your follow-up appointment?: No Do you understand care options if your condition(s) worsen?: Yes-patient verbalized understanding  SDOH Interventions Today    Flowsheet Row Most Recent Value  SDOH Interventions   Food Insecurity  Interventions Intervention Not Indicated  Housing Interventions Intervention Not Indicated  Transportation Interventions Intervention Not Indicated  Utilities Interventions Intervention Not Indicated    Goals Addressed             This Visit's Progress    VBCI Transitions of Care (TOC) Care Plan       Problems:  Recent Hospitalization for treatment of SIRS (systemic inflammatory response syndrome) Colitis - sepsis SIRS (systemic inflammatory response syndrome) Colitis - sepsis  Goal:  Over the next 30 days, the patient will not experience hospital readmission  Interventions:  Transitions of Care: Doctor Visits  - discussed the importance of doctor visits Contacted provider for patient needs [facility] RN sent PCP the following message in EPIC and received the following response during call with patient:Hi Dr Alvan - I'm on the phone with patient now - She is newly prescribed Flagyl and an interaction with Ozempic  popped up when I added it. She takes Ozempic  on Fridays - Should she skip this Friday or okay to take? Re: Omeprazole  - she says her pharmacy tells her she has no refills but states she has called your office and has been told she has another refill. She's asking  if this can be addressed. Lastly, Patient was admitted 7/1 - 7/7 at [facility] - patient states she has developed small amount of vagina bleeding and has not had periods a good year Any suggestions? -  Message from Dr Parnell, We have scheduled a hospital follow up with her for 7/14-  she can still take the Ozempic  on Friday. We can resend the omeprzole as well. Thank you for scheduling her f/u - we can address bleeding then. Thank you!!!    Arranged PCP follow-up within 7 days (Care Guide Scheduled) 04/09/24 11am  Education provided on colitis management Educated patient to notify PCP office if she has increase in bleeding or if diarrhea worsens   Asthma: Provided instruction about proper use of medications  used for management of Asthma including inhalers Discussed the importance of adequate rest and management of fatigue with Asthma Assessed social determinant of health barriers    Diabetes Interventions: Assessed patient's understanding of A1c goal: <6.5% Reviewed medications with patient and discussed importance of medication adherence Discussed plans with patient for ongoing care management follow up and provided patient with direct contact information for care management team Reviewed scheduled/upcoming provider appointments including: PCP hospital follow up is 04/09/24 and 4 month follow up related to recently diagnosed diabetes 05/01/24 Lab Results  Component Value Date   HGBA1C 6.9 (A) 12/27/2023    Patient Self Care Activities:  Attend all scheduled provider appointments Call pharmacy for medication refills 3-7 days in advance of running out of medications Call provider office for new concerns or questions  Notify RN Care Manager of TOC call rescheduling needs Participate in Transition of Care Program/Attend TOC scheduled calls Take medications as prescribed   check blood sugar at prescribed times: as directed by MD and notify MD with abnormal readings or signs / symptoms hypo/hyperglycemia manage portion size Contact PCP with worsening vaginal bleeding Contact PCP if diarrhea worsens or does not resolve  Plan:  Telephone follow up appointment with care management team member scheduled for:  04/10/24 11am  The patient has been provided with contact information for the care management team and has been advised to call with any health related questions or concerns.         Shona Prow RN, CCM [place]  VBCI-Population Health RN Care Manager [phone]

## 2024-04-03 NOTE — Patient Instructions (Signed)
 Visit Information  Thank you for taking time to visit with me today. Please don't hesitate to contact me if I can be of assistance to you before our next scheduled telephone appointment.  Our next appointment is by telephone on 04/10/24 at 11am  Following is a copy of your care plan:   Goals Addressed             This Visit's Progress    VBCI Transitions of Care (TOC) Care Plan       Problems:  Recent Hospitalization for treatment of SIRS (systemic inflammatory response syndrome) Colitis - sepsis SIRS (systemic inflammatory response syndrome) Colitis - sepsis  Goal:  Over the next 30 days, the patient will not experience hospital readmission  Interventions:  Transitions of Care: Doctor Visits  - discussed the importance of doctor visits Contacted provider for patient needs [facility] RN sent PCP the following message in EPIC and received the following response during call with patient:Hi Dr Alvan - I'm on the phone with patient now - She is newly prescribed Flagyl and an interaction with Ozempic  popped up when I added it. She takes Ozempic  on Fridays - Should she skip this Friday or okay to take? Re: Omeprazole  - she says her pharmacy tells her she has no refills but states she has called your office and has been told she has another refill. She's asking if this can be addressed. Lastly, Patient was admitted 7/1 - 7/7 at [facility] - patient states she has developed small amount of vagina bleeding and has not had periods a good year Any suggestions? -  Message from Dr Parnell, We have scheduled a hospital follow up with her for 7/14-  she can still take the Ozempic  on Friday. We can resend the omeprzole as well. Thank you for scheduling her f/u - we can address bleeding then. Thank you!!!    Arranged PCP follow-up within 7 days (Care Guide Scheduled) 04/09/24 11am  Education provided on colitis management Educated patient to notify PCP office if she has increase in bleeding or if  diarrhea worsens   Asthma: Provided instruction about proper use of medications used for management of Asthma including inhalers Discussed the importance of adequate rest and management of fatigue with Asthma Assessed social determinant of health barriers    Diabetes Interventions: Assessed patient's understanding of A1c goal: <6.5% Reviewed medications with patient and discussed importance of medication adherence Discussed plans with patient for ongoing care management follow up and provided patient with direct contact information for care management team Reviewed scheduled/upcoming provider appointments including: PCP hospital follow up is 04/09/24 and 4 month follow up related to recently diagnosed diabetes 05/01/24 Lab Results  Component Value Date   HGBA1C 6.9 (A) 12/27/2023    Patient Self Care Activities:  Attend all scheduled provider appointments Call pharmacy for medication refills 3-7 days in advance of running out of medications Call provider office for new concerns or questions  Notify RN Care Manager of TOC call rescheduling needs Participate in Transition of Care Program/Attend TOC scheduled calls Take medications as prescribed   check blood sugar at prescribed times: as directed by MD and notify MD with abnormal readings or signs / symptoms hypo/hyperglycemia manage portion size Contact PCP with worsening vaginal bleeding Contact PCP if diarrhea worsens or does not resolve  Plan:  Telephone follow up appointment with care management team member scheduled for:  04/10/24 11am  The patient has been provided with contact information for the care management team  and has been advised to call with any health related questions or concerns.         Patient verbalizes understanding of instructions and care plan provided today and agrees to view in MyChart. Active MyChart status and patient understanding of how to access instructions and care plan via MyChart confirmed with  patient.     Telephone follow up appointment with care management team member scheduled for: 04/10/24 11am The patient has been provided with contact information for the care management team and has been advised to call with any health related questions or concerns.   Please call the care guide team at [phone] if you need to cancel or reschedule your appointment.   Please call the Suicide and Crisis Lifeline: 988 call 1-800-273-TALK (toll free, 24 hour hotline) call 911 if you are experiencing a Mental Health or Behavioral Health Crisis or need someone to talk to.  Shona Prow RN, CCM [place]  VBCI-Population Health RN Care Manager [phone]

## 2024-04-09 ENCOUNTER — Ambulatory Visit: Admitting: Family Medicine

## 2024-04-09 ENCOUNTER — Encounter: Payer: Self-pay | Admitting: Family Medicine

## 2024-04-09 VITALS — BP 133/85 | HR 118 | Ht 66.0 in | Wt 213.4 lb

## 2024-04-09 DIAGNOSIS — I959 Hypotension, unspecified: Secondary | ICD-10-CM | POA: Diagnosis not present

## 2024-04-09 DIAGNOSIS — K529 Noninfective gastroenteritis and colitis, unspecified: Secondary | ICD-10-CM

## 2024-04-09 DIAGNOSIS — R579 Shock, unspecified: Secondary | ICD-10-CM

## 2024-04-09 NOTE — Progress Notes (Signed)
 Established Patient Office Visit  Subjective  Patient ID: Zyionna Pesce, female    DOB: 02/03/1971  Age: 53 y.o. MRN: [id number]  Chief Complaint  Patient presents with   Hospitalization Follow-up    HPI Here today for hospital follow-up.  She woke up the morning of July 1.  She had a little bit of steak and some leftover mashed potatoes.  And then was getting ready for work when she suddenly started to feel little shaky so she actually checked her blood sugar and it was low at 73.  So she had a small prepackaged rice crispy treat and a little bit of bread with some peanut butter.  Which she had eaten before without any problems.  About 15 minutes later she started to feel worse and ended up having vomiting and a little bit of diarrhea.  She laid down and then started to feel bad and weak again so went back to the bathroom and had some more vomiting and diarrhea.  Her mom came and actually check on her and actually ended up calling EMS.  According to her mom she was pretty weak and almost passed out.  She is not sure if she fully passed out or not.  EMS reported that she looked flushed and red all over her body and had cyanotic nails.  GCS of 13.  Patient had also complained of abdominal pain.  CT of the head was negative for any acute abnormalities they also did a CT of her abdomen and pelvis.  They did note fatty liver and mild to moderate wall thickening of the transverse colon diffuse moderate wall thickening of the descending and sigmoid colon with some constipation distally.  There was a little bit of free fluid in the pelvis but no adenopathy uterus and other structures look normal.  She also had a small fat-containing umbilical hernia.  Interestingly while in the hospital she actually started to have a menstrual period even though she had not had a period in almost a year she says the bleeding got a little heavier when she was discharged home but now it seems to be tapering off.  No  recent tick bites.  She eats beef regularly and this was not unusual.  She eats peanut butter regularly and that was not unusual either.  Initially they felt like she may have had some type of allergic or anaphylactic reaction to maybe the peanut butter but ended up diagnosing her with SIRS.  She was hospitalized for 6 days.  They recommended that she also follow-up with GI and discharged her home on Cipro and metronidazole she has completed her antibiotics.  She is feeling much better but appetite is not quite back to normal.  She has been holding her Ozempic .  Cardiogram showed EF to 60 to 65%.  They did hold her methotrexate  during admission as well.    ROS    Objective:     BP 133/85   Pulse (!) 118   Ht 5' 6 (1.676 m)   Wt 213 lb 6.4 oz (96.8 kg)   SpO2 98%   BMI 34.44 kg/m    Physical Exam Vitals and nursing note reviewed.  Constitutional:      Appearance: Normal appearance.  HENT:     Head: Normocephalic and atraumatic.  Eyes:     Conjunctiva/sclera: Conjunctivae normal.  Cardiovascular:     Rate and Rhythm: Normal rate and regular rhythm.  Pulmonary:     Effort: Pulmonary effort is normal.  Breath sounds: Normal breath sounds.  Skin:    General: Skin is warm and dry.  Neurological:     Mental Status: She is alert.  Psychiatric:        Mood and Affect: Mood normal.      No results found for any visits on 04/09/24.    The 10-year ASCVD risk score (Arnett DK, et al., 2019) is: 3.9%    Assessment & Plan:   Problem List Items Addressed This Visit   None Visit Diagnoses       Hypotension, unspecified hypotension type    -  Primary   Relevant Orders   Allergy  Panel 18, Nut Mix Group   Alpha-Gal Panel   CBC with Differential/Platelet     Shock (HCC)       Relevant Orders   Allergy  Panel 18, Nut Mix Group   Alpha-Gal Panel   CBC with Differential/Platelet     Colitis           They feel like she had a pretty severe reaction along with  colitis.  Less likely to be of potential food allergy  though we will plan to do check IgE levels for nut panel.  She is feeling better and appetite is improving slowly.  Like to recheck a CBC today as well.  Okay to restart the Ozempic  maybe in another week as long as she is feeling much better and appetite is back to baseline.  No recent tick bites though consider testing for alpha gal as well.  Time spent including viewing records and imaging reports 30 minutes   Return in about 3 months (around 07/17/2024) for Diabetes follow-up.    Dorothyann Byars, MD

## 2024-04-10 ENCOUNTER — Other Ambulatory Visit: Payer: Self-pay

## 2024-04-10 ENCOUNTER — Ambulatory Visit: Payer: Self-pay | Admitting: Family Medicine

## 2024-04-10 DIAGNOSIS — K529 Noninfective gastroenteritis and colitis, unspecified: Secondary | ICD-10-CM

## 2024-04-10 NOTE — Transitions of Care (Post Inpatient/ED Visit) (Signed)
 Transition of Care week 2  Visit Note  04/10/2024  Name: Nancy Wang MRN: [id number]          DOB: Apr 17, 1971  Situation: Patient enrolled in [facility] 30-day program. Visit completed with patient by telephone.   Background:   Initial Transition Care Management Follow-up Telephone Call    Past Medical History:  Diagnosis Date   Bronchitis    GERD (gastroesophageal reflux disease)    Pseudogout    Dr. Lenon   RA (rheumatoid arthritis) (HCC)    Follow by Ludie Lenon, MD - [facility]   Thyroid  disease    hypothyroidism    Assessment: Patient Reported Symptoms: Cognitive Cognitive Status: No symptoms reported, Normal speech and language skills, Alert and oriented to person, place, and time      Neurological Neurological Review of Symptoms: No symptoms reported    HEENT HEENT Symptoms Reported: No symptoms reported      Cardiovascular Cardiovascular Symptoms Reported: No symptoms reported Weight: 210 lb (95.3 kg) Cardiovascular Self-Management Outcome: 4 (good)  Respiratory Respiratory Symptoms Reported: No symptoms reported    Endocrine Endocrine Symptoms Reported: Weakness or fatigue Is patient diabetic?: Yes Is patient checking blood sugars at home?: Yes List most recent blood sugar readings, include date and time of day: Patient has not checked her sugar today - states she usually checks daily - reports she thinks sugar yesterday was 115 Endocrine Self-Management Outcome: 4 (good) Endocrine Comment: Patient states she feels she has a good understanding of how to manage hyper/hypo glycemia  Gastrointestinal Gastrointestinal Symptoms Reported: Diarrhea Additional Gastrointestinal Details: patient states her stools are still loose - education provided on BRAT diet Gastrointestinal Management Strategies: Diet modification Gastrointestinal Comment: Educated on diet to help with colitis    Genitourinary Genitourinary Symptoms Reported: No  symptoms reported    Integumentary      Musculoskeletal          Psychosocial           There were no vitals filed for this visit.  Medications Reviewed Today     Reviewed by Lauro Shona LABOR, RN (Registered Nurse) on 04/10/24 at 1109  Med List Status: <None>   Medication Order Taking? Sig Documenting Provider Last Dose Status Informant  Accu-Chek Softclix Lancets lancets [id number] Yes CHECK BLOOD SUGAR IN THE MORNING, AT NOON AND AT BEDTIME AS DIRECTED Alvan Dorothyann BIRCH, MD  Active   albuterol  (VENTOLIN  HFA) 108 (90 Base) MCG/ACT inhaler [id number] Yes Inhale 1-2 puffs into the lungs every 6 (six) hours as needed for wheezing or shortness of breath. Ward, Jessica Z, PA-C  Active   Blood Glucose Monitoring Suppl DEVI [id number] Yes 1 each by Does not apply route in the morning, at noon, and at bedtime. May substitute to any manufacturer covered by patient's insurance. Alvan Dorothyann BIRCH, MD  Active   BuPROPion  HBr 174 MG TB24 [id number] Yes Take 1 tablet by mouth daily. [provider]  Active   cholecalciferol (VITAMIN D3) 25 MCG (1000 UNIT) tablet [id number] Yes Take 1,000 Units by mouth daily. [provider]  Active   COLCRYS 0.6 MG tablet [id number] Yes Take 0.6 mg by mouth daily as needed. [provider]  Active   dapagliflozin  propanediol (FARXIGA ) 5 MG TABS tablet [id number] Yes Take 1 tablet (5 mg total) by mouth daily. Alvan Dorothyann BIRCH, MD  Active   DULoxetine  (CYMBALTA ) 60 MG capsule [id number] Yes TAKE 1 CAPSULE BY MOUTH EVERY DAY Alvan Dorothyann BIRCH, MD  Active  folic acid (FOLVITE) 1 MG tablet [id number] Yes  [provider]  Active            Med Note JEANNA BASCOM CROME   Tue Feb 05, 2020  2:10 PM)    levothyroxine  (SYNTHROID ) 100 MCG tablet [id number] Yes TAKE 1 TABLET (100 MCG TOTAL) BY MOUTH DAILY BEFORE BREAKFAST. NEEDS LABS Alvan Dorothyann BIRCH, MD  Active   methotrexate  [facility]) 2.5 MG tablet [id number] Yes Take 2.5  mg by mouth once a week. Take 9 tablets by mouth once a week [provider]  Active Self           Med Note JEANNA, BASCOM CROME   Tue Feb 05, 2020  2:10 PM)    metroNIDAZOLE (METROCREAM) 0.75 % cream [id number]  Apply 1 Application topically daily. [provider]  Active   omeprazole  (PRILOSEC) 20 MG capsule [id number] Yes Take 1 capsule (20 mg total) by mouth daily. Alvan Dorothyann BIRCH, MD  Active   RINVOQ 15 MG [phone] [id number] Yes Take 1 tablet by mouth daily. [provider]  Active   Semaglutide ,0.25 or 0.5MG /DOS, (OZEMPIC , 0.25 OR 0.5 MG/DOSE,) 2 MG/3ML SOPN [id number]  INJECT 0.25 MG SUBCUTANEOUSLY ONE TIME PER WEEK  Patient not taking: Reported on 04/10/2024   Antoniette Vermell CROME, PA-C  Active   traZODone  (DESYREL ) 150 MG tablet [id number] Yes TAKE 1 TABLET BY MOUTH EVERYDAY AT BEDTIME Alvan Dorothyann BIRCH, MD  Active             Recommendation:   Continue Current Plan of Care  Follow Up Plan:   Telephone follow up appointment date/time:  04/17/24 11am  Shona Prow RN, CCM [place]  VBCI-Population Health RN Care Manager [phone]

## 2024-04-10 NOTE — Progress Notes (Signed)
 Hi Nancy Wang, your white blood cells and hemoglobin are still little elevated.  I am wondering if you still might be a little dry so just make sure you are drinking plenty of water and trying to hydrate well.  Like to still recheck these numbers in about 2 weeks to see if they are trending down.

## 2024-04-10 NOTE — Patient Instructions (Signed)
 Visit Information  Thank you for taking time to visit with me today. Please don't hesitate to contact me if I can be of assistance to you before our next scheduled telephone appointment.  Our next appointment is by telephone on 04/17/24 at 11am  Following is a copy of your care plan:   Goals Addressed             This Visit's Progress    VBCI Transitions of Care (TOC) Care Plan       Problems:  Recent Hospitalization for treatment of SIRS (systemic inflammatory response syndrome) Colitis - sepsis SIRS (systemic inflammatory response syndrome) Colitis - sepsis  Goal:  Over the next 30 days, the patient will not experience hospital readmission  Interventions:  Transitions of Care: Doctor Visits  - discussed the importance of doctor visits Contacted provider for patient needs [facility] RN sent PCP the following message in EPIC and received the following response during call with patient:Hi Dr Alvan - I'm on the phone with patient now - She is newly prescribed Flagyl and an interaction with Ozempic  popped up when I added it. She takes Ozempic  on Fridays - Should she skip this Friday or okay to take? Re: Omeprazole  - she says her pharmacy tells her she has no refills but states she has called your office and has been told she has another refill. She's asking if this can be addressed. Lastly, Patient was admitted 7/1 - 7/7 at [facility] - patient states she has developed small amount of vagina bleeding and has not had periods a good year Any suggestions? -  Message from Dr Parnell, We have scheduled a hospital follow up with her for 7/14-  she can still take the Ozempic  on Friday. We can resend the omeprzole as well. Thank you for scheduling her f/u - we can address bleeding then. Thank you!!!    Arranged PCP follow-up within 7 days (Care Guide Scheduled) 04/09/24 11am  Education provided on colitis management Educated patient to notify PCP office if she has increase in bleeding or if  diarrhea worsens - 04/10/24 Update: Patient states she discussed vaginal bleeding with Dr Alvan and states they are going to continue to monitor it  04/10/24 Reviewed with patient importance of scheduling colonoscopy and patient states she will call office of Dr Lindaann and schedule 04/10/24 Education provided  including: Foods to Eat  Low-Residue Foods: These include refined grains (like white rice and white bread), lean proteins (such as chicken and fish), and starchy vegetables (like potatoes). These foods can help reduce bowel movements and ease symptoms.  Omega-3 Fatty Acids: Foods rich in omega-3s, such as salmon, mackerel, walnuts, and flaxseeds, are recommended for their anti-inflammatory properties.  Cooked Non-Cruciferous Vegetables: Vegetables like carrots, zucchini, and squash are easier to digest when cooked and can provide essential nutrients without irritating the gut.  Fruits: Opt for ripe bananas, melons, and cooked fruits, which are generally easier to digest than raw fruits.  Probiotics: Foods like yogurt with live cultures can help maintain gut health and may reduce flare-ups. Foods to Avoid High-Fiber Foods: During flare-ups, it's best to avoid high-fiber foods such as raw vegetables, whole grains, and nuts, as they can increase cramping and diarrhea.  Spicy Foods and Caffeine: These can irritate the digestive tract and exacerbate symptoms, so it's advisable to limit or avoid them.  Alcohol: Many individuals with ulcerative colitis find that alcohol triggers their symptoms, so it's often recommended to abstain.  Processed Foods: Foods high  in saturated fats, sugars, and additives can lead to inflammation and should be minimized.   Asthma: Provided instruction about proper use of medications used for management of Asthma including inhalers Discussed the importance of adequate rest and management of fatigue with Asthma Assessed social determinant of health barriers    Diabetes  Interventions: Assessed patient's understanding of A1c goal: <6.5% Reviewed medications with patient and discussed importance of medication adherence Discussed plans with patient for ongoing care management follow up and provided patient with direct contact information for care management team Reviewed scheduled/upcoming provider appointments including: PCP hospital follow up is 04/09/24 and 4 month follow up related to recently diagnosed diabetes 05/01/24 - Patient completed hospital follow up with PC and will have repeat labs in 2 weeks related to WBC and Hgb - Patient confirmed she is drinking water and trying to stay hydrated as directed by Dr Alvan   Lab Results  Component Value Date   HGBA1C 6.9 (A) 12/27/2023    Patient Self Care Activities:  Attend all scheduled provider appointments Call pharmacy for medication refills 3-7 days in advance of running out of medications Call provider office for new concerns or questions  Notify RN Care Manager of TOC call rescheduling needs Participate in Transition of Care Program/Attend TOC scheduled calls Take medications as prescribed   check blood sugar at prescribed times: as directed by MD and notify MD with abnormal readings or signs / symptoms hypo/hyperglycemia manage portion size Contact PCP with worsening vaginal bleeding Contact PCP if diarrhea worsens or does not resolve  Plan:  Telephone follow up appointment with care management team member scheduled for:  04/17/24 11am  The patient has been provided with contact information for the care management team and has been advised to call with any health related questions or concerns.         Patient verbalizes understanding of instructions and care plan provided today and agrees to view in MyChart. Active MyChart status and patient understanding of how to access instructions and care plan via MyChart confirmed with patient.     Telephone follow up appointment with care management team  member scheduled for:04/17/24 11am The patient has been provided with contact information for the care management team and has been advised to call with any health related questions or concerns.   Please call the care guide team at [phone] if you need to cancel or reschedule your appointment.   Please call the Suicide and Crisis Lifeline: 988 call 1-800-273-TALK (toll free, 24 hour hotline) call 911 if you are experiencing a Mental Health or Behavioral Health Crisis or need someone to talk to.  Shona Prow RN, CCM [place]  VBCI-Population Health RN Care Manager [phone]

## 2024-04-11 ENCOUNTER — Other Ambulatory Visit: Payer: Self-pay | Admitting: Family Medicine

## 2024-04-11 DIAGNOSIS — E118 Type 2 diabetes mellitus with unspecified complications: Secondary | ICD-10-CM

## 2024-04-12 LAB — CBC WITH DIFFERENTIAL/PLATELET
Basophils Absolute: 0.1 x10E3/uL (ref 0.0–0.2)
Basos: 1 %
EOS (ABSOLUTE): 0.1 x10E3/uL (ref 0.0–0.4)
Eos: 1 %
Hematocrit: 53.3 % — ABNORMAL HIGH (ref 34.0–46.6)
Hemoglobin: 17.1 g/dL — ABNORMAL HIGH (ref 11.1–15.9)
Immature Grans (Abs): 0.1 x10E3/uL (ref 0.0–0.1)
Immature Granulocytes: 1 %
Lymphocytes Absolute: 3.2 x10E3/uL — ABNORMAL HIGH (ref 0.7–3.1)
Lymphs: 26 %
MCH: 31.6 pg (ref 26.6–33.0)
MCHC: 32.1 g/dL (ref 31.5–35.7)
MCV: 99 fL — ABNORMAL HIGH (ref 79–97)
Monocytes Absolute: 0.9 x10E3/uL (ref 0.1–0.9)
Monocytes: 7 %
Neutrophils Absolute: 8 x10E3/uL — ABNORMAL HIGH (ref 1.4–7.0)
Neutrophils: 64 %
Platelets: 391 x10E3/uL (ref 150–450)
RBC: 5.41 x10E6/uL — ABNORMAL HIGH (ref 3.77–5.28)
RDW: 13.1 % (ref 11.7–15.4)
WBC: 12.5 x10E3/uL — ABNORMAL HIGH (ref 3.4–10.8)

## 2024-04-12 LAB — ALLERGY PANEL 18, NUT MIX GROUP
Allergen Coconut IgE: 0.16 kU/L — AB
F020-IgE Almond: 0.23 kU/L — AB
F202-IgE Cashew Nut: 0.13 kU/L — AB
Hazelnut (Filbert) IgE: 4.78 kU/L — AB
Peanut IgE: 0.33 kU/L — AB
Pecan Nut IgE: 0.12 kU/L — AB
Sesame Seed IgE: 0.35 kU/L — AB

## 2024-04-12 LAB — ALPHA-GAL PANEL
Allergen Lamb IgE: <0.1 kU/L
Beef IgE: <0.1 kU/L
IgE (Immunoglobulin E), Serum: 360 [IU]/mL (ref 6–495)
O215-IgE Alpha-Gal: <0.1 kU/L
Pork IgE: <0.1 kU/L

## 2024-04-13 NOTE — Progress Notes (Signed)
 Hi Nancy Wang, on the mixed nut group you did come back with antibodies to sesame seeds, peanuts, hazelnut, almonds, coconut, pecans and cashews.  You reacted most strongly to hazelnuts.  So I would definitely avoid those.  You had a slightly less intense reaction to sesame and peanut.  And the weakest reactions were to the almonds coconuts pecans and cashews.  So I do think it is possible that the peanut butter set you off.  Again why you were able to eat it the day before but not that day I am really not sure but I would try to avoid exposure for now.  If you would prefer to have consultation with an allergy  specialist in this regard I am happy to refer you to a allergist.

## 2024-04-17 ENCOUNTER — Telehealth: Payer: Self-pay

## 2024-04-18 ENCOUNTER — Other Ambulatory Visit: Payer: Self-pay | Admitting: *Deleted

## 2024-04-18 ENCOUNTER — Telehealth: Payer: Self-pay | Admitting: Family Medicine

## 2024-04-18 DIAGNOSIS — D72828 Other elevated white blood cell count: Secondary | ICD-10-CM

## 2024-04-18 NOTE — Telephone Encounter (Signed)
 Patient informed.

## 2024-04-18 NOTE — Telephone Encounter (Signed)
Lab ordered pt informed

## 2024-04-18 NOTE — Telephone Encounter (Signed)
 Labs ordered.  She can come anytime she is welcome to come first thing in the morning if she would like.

## 2024-04-18 NOTE — Telephone Encounter (Signed)
 Copied from CRM #[id number]. Topic: Clinical - Request for Lab/Test Order >> Apr 18, 2024 10:20 AM Laurier BROCKS wrote: Reason for CRM: Patient would like a call back at [phone] to schedule a sooner appointment. Patient request white blood count labs Please advise

## 2024-04-19 ENCOUNTER — Other Ambulatory Visit: Payer: Self-pay | Admitting: Family Medicine

## 2024-04-19 DIAGNOSIS — G4700 Insomnia, unspecified: Secondary | ICD-10-CM

## 2024-04-20 LAB — CMP14+EGFR
ALT: 61 IU/L — ABNORMAL HIGH (ref 0–32)
AST: 37 IU/L (ref 0–40)
Albumin: 3.9 g/dL (ref 3.8–4.9)
Alkaline Phosphatase: 98 IU/L (ref 44–121)
BUN/Creatinine Ratio: 10 (ref 9–23)
BUN: 10 mg/dL (ref 6–24)
Bilirubin Total: 0.9 mg/dL (ref 0.0–1.2)
CO2: 23 mmol/L (ref 20–29)
Calcium: 9.7 mg/dL (ref 8.7–10.2)
Chloride: 99 mmol/L (ref 96–106)
Creatinine, Ser: 1 mg/dL (ref 0.57–1.00)
Globulin, Total: 3.2 g/dL (ref 1.5–4.5)
Glucose: 166 mg/dL — ABNORMAL HIGH (ref 70–99)
Potassium: 4.4 mmol/L (ref 3.5–5.2)
Sodium: 138 mmol/L (ref 134–144)
Total Protein: 7.1 g/dL (ref 6.0–8.5)
eGFR: 67 mL/min/1.73 (ref >59)

## 2024-04-20 LAB — CBC WITH DIFFERENTIAL/PLATELET
Basophils Absolute: 0.1 x10E3/uL (ref 0.0–0.2)
Basos: 1 %
EOS (ABSOLUTE): 0.2 x10E3/uL (ref 0.0–0.4)
Eos: 2 %
Hematocrit: 47.9 % — ABNORMAL HIGH (ref 34.0–46.6)
Hemoglobin: 15.5 g/dL (ref 11.1–15.9)
Immature Grans (Abs): 0.1 x10E3/uL (ref 0.0–0.1)
Immature Granulocytes: 1 %
Lymphocytes Absolute: 2.2 x10E3/uL (ref 0.7–3.1)
Lymphs: 28 %
MCH: 32.1 pg (ref 26.6–33.0)
MCHC: 32.4 g/dL (ref 31.5–35.7)
MCV: 99 fL — ABNORMAL HIGH (ref 79–97)
Monocytes Absolute: 0.5 x10E3/uL (ref 0.1–0.9)
Monocytes: 7 %
Neutrophils Absolute: 4.8 x10E3/uL (ref 1.4–7.0)
Neutrophils: 61 %
Platelets: 437 x10E3/uL (ref 150–450)
RBC: 4.83 x10E6/uL (ref 3.77–5.28)
RDW: 13.6 % (ref 11.7–15.4)
WBC: 7.8 x10E3/uL (ref 3.4–10.8)

## 2024-04-20 LAB — LIPASE: Lipase: 33 U/L (ref 14–72)

## 2024-04-20 NOTE — Progress Notes (Signed)
 Hi Stavroula, hemoglobin is back to normal.  It looks like it is back to your baseline.  That is reassuring.  The ALT liver enzyme is still elevated similar to last year.  Most often this comes from diet just really encouraged her to work on improving dietary choices and getting regular exercise that can usually help.  Will plan to recheck liver function again in 3 months.  No sign of pancreatitis.

## 2024-04-23 ENCOUNTER — Other Ambulatory Visit: Payer: Self-pay | Admitting: Family Medicine

## 2024-04-23 DIAGNOSIS — E118 Type 2 diabetes mellitus with unspecified complications: Secondary | ICD-10-CM

## 2024-04-24 ENCOUNTER — Other Ambulatory Visit: Payer: Self-pay | Admitting: Physician Assistant

## 2024-04-24 DIAGNOSIS — E118 Type 2 diabetes mellitus with unspecified complications: Secondary | ICD-10-CM

## 2024-04-27 NOTE — Telephone Encounter (Signed)
 Called pt lvm advising her that since she was just here recently and had already had an A1c done she didn't need to come in for this appointment unless she needed to. Advised that she has a f/u appointment in October and that it would be ok to keep that appointment instead.   Asked that she rtn call to discuss.

## 2024-04-29 ENCOUNTER — Other Ambulatory Visit: Payer: Self-pay | Admitting: Family Medicine

## 2024-04-29 DIAGNOSIS — E039 Hypothyroidism, unspecified: Secondary | ICD-10-CM

## 2024-05-01 ENCOUNTER — Ambulatory Visit: Admitting: Family Medicine

## 2024-05-14 DIAGNOSIS — R933 Abnormal findings on diagnostic imaging of other parts of digestive tract: Secondary | ICD-10-CM | POA: Diagnosis not present

## 2024-05-17 ENCOUNTER — Telehealth: Payer: Self-pay | Admitting: Family Medicine

## 2024-05-17 DIAGNOSIS — J302 Other seasonal allergic rhinitis: Secondary | ICD-10-CM

## 2024-05-17 NOTE — Telephone Encounter (Unsigned)
 Copied from CRM [id number]. Topic: Referral - Request for Referral >> May 17, 2024  2:46 PM Carrielelia G wrote: [name]. Said she discussed with Dr Alvan about being seeing an allergist. [name] stated she would like to now go forward with that request.   Please advise

## 2024-05-18 ENCOUNTER — Encounter: Payer: Self-pay | Admitting: Family Medicine

## 2024-05-18 NOTE — Telephone Encounter (Signed)
Orders Placed This Encounter  Procedures   Ambulatory referral to Allergy    Referral Priority:   Routine    Referral Type:   Allergy Testing    Referral Reason:   Specialty Services Required    Requested Specialty:   Allergy    Number of Visits Requested:   1    

## 2024-05-22 NOTE — Telephone Encounter (Signed)
 Last read by Powell Heading at 3:35PM on 05/21/2024.

## 2024-06-13 ENCOUNTER — Telehealth: Payer: Self-pay

## 2024-06-13 DIAGNOSIS — J4520 Mild intermittent asthma, uncomplicated: Secondary | ICD-10-CM | POA: Diagnosis not present

## 2024-06-13 DIAGNOSIS — J301 Allergic rhinitis due to pollen: Secondary | ICD-10-CM | POA: Diagnosis not present

## 2024-06-13 DIAGNOSIS — J3081 Allergic rhinitis due to animal (cat) (dog) hair and dander: Secondary | ICD-10-CM | POA: Diagnosis not present

## 2024-06-13 DIAGNOSIS — J302 Other seasonal allergic rhinitis: Secondary | ICD-10-CM | POA: Diagnosis not present

## 2024-06-13 DIAGNOSIS — Z91018 Allergy to other foods: Secondary | ICD-10-CM | POA: Diagnosis not present

## 2024-06-13 DIAGNOSIS — T63441A Toxic effect of venom of bees, accidental (unintentional), initial encounter: Secondary | ICD-10-CM | POA: Diagnosis not present

## 2024-06-13 DIAGNOSIS — J3089 Other allergic rhinitis: Secondary | ICD-10-CM | POA: Diagnosis not present

## 2024-06-13 DIAGNOSIS — Z87892 Personal history of anaphylaxis: Secondary | ICD-10-CM | POA: Diagnosis not present

## 2024-06-13 NOTE — Telephone Encounter (Signed)
 Copied from CRM [id number]. Topic: Clinical - Request for Lab/Test Order >> Jun 13, 2024 12:03 PM Graeme ORN wrote: Reason for CRM: Patient called. Left Allergist she was referred to by PCP - needs to have blood work done. Would like to know if it can be done here. Has paper order for [facility]. Thank you    Called pt and advised her that she could have her labs drawn here.

## 2024-07-17 ENCOUNTER — Ambulatory Visit: Admitting: Family Medicine

## 2024-07-17 ENCOUNTER — Encounter: Payer: Self-pay | Admitting: Family Medicine

## 2024-07-17 VITALS — BP 113/74 | HR 100 | Ht 65.0 in | Wt 215.1 lb

## 2024-07-17 DIAGNOSIS — M069 Rheumatoid arthritis, unspecified: Secondary | ICD-10-CM

## 2024-07-17 DIAGNOSIS — E118 Type 2 diabetes mellitus with unspecified complications: Secondary | ICD-10-CM

## 2024-07-17 DIAGNOSIS — E039 Hypothyroidism, unspecified: Secondary | ICD-10-CM | POA: Diagnosis not present

## 2024-07-17 DIAGNOSIS — E1169 Type 2 diabetes mellitus with other specified complication: Secondary | ICD-10-CM | POA: Diagnosis not present

## 2024-07-17 DIAGNOSIS — E785 Hyperlipidemia, unspecified: Secondary | ICD-10-CM

## 2024-07-17 DIAGNOSIS — Z23 Encounter for immunization: Secondary | ICD-10-CM | POA: Diagnosis not present

## 2024-07-17 DIAGNOSIS — F330 Major depressive disorder, recurrent, mild: Secondary | ICD-10-CM | POA: Diagnosis not present

## 2024-07-17 LAB — POCT GLYCOSYLATED HEMOGLOBIN (HGB A1C): Hemoglobin A1C: 6.2 % — AB (ref 4.0–5.6)

## 2024-07-17 NOTE — Assessment & Plan Note (Signed)
  Depression Cymbalta , trazodone , and bupropion  are effective with no concerns. - Continue Cymbalta , trazodone , and bupropion  as currently prescribed.  Flowsheet Row Office Visit from 07/17/2024 in [facility] at [facility]  PHQ-9 Total Score 4

## 2024-07-17 NOTE — Assessment & Plan Note (Addendum)
 Type 2 diabetes mellitus A1c is 6.2, indicating good control. - Continue current diabetes management plan. - due for urine micro

## 2024-07-17 NOTE — Progress Notes (Signed)
 Established Patient Office Visit  Subjective  Patient ID: Nancy Wang, female    DOB: [birth year]  Age: 53 y.o. MRN: [id number]  Chief Complaint  Patient presents with   Diabetes   Hypertension    HPI  Discussed the use of AI scribe software for clinical note transcription with the patient, who gave verbal consent to proceed.  History of Present Illness Nancy Wang is a 53 year old female with nut allergies who presents for follow-up on her allergy  management.  Allergic reactions and anaphylaxis risk - No recent episodes of allergic reactions - Strict avoidance of nuts, particularly hazelnuts, identified as a trigger - Avoids peanuts despite low allergy  numbers - Carries an EpiPen  and albuterol  inhaler for emergencies - Wears a medical alert bracelet for nut allergy   RA - Arthralgia and hand stiffness - Increased pain and stiffness in the hands - Presence of a nodule on one knuckle - Arthritis managed with methotrexate  and Rinvoq  Glycemic control - Hemoglobin A1c is 6.2  Mood and sleep disturbance - Cymbalta , trazodone  for sleep, and bupropion  are effective for mood and sleep symptoms  Thyroid  dysfunction - Takes levothyroxine  100 micrograms daily for hypothyroidism - Due for updated TSH laboratory evaluation  Ophthalmologic surveillance - No eye exam in over a year      ROS    Objective:     BP 113/74   Pulse 100   Ht 5' 5 (1.651 m)   Wt 215 lb 1.9 oz (97.6 kg)   SpO2 95%   BMI 35.80 kg/m    Physical Exam Vitals and nursing note reviewed.  Constitutional:      Appearance: Normal appearance.  HENT:     Head: Normocephalic and atraumatic.  Eyes:     Conjunctiva/sclera: Conjunctivae normal.  Cardiovascular:     Rate and Rhythm: Normal rate and regular rhythm.  Pulmonary:     Effort: Pulmonary effort is normal.     Breath sounds: Normal breath sounds.  Skin:    General: Skin is warm and dry.  Neurological:     Mental Status:  She is alert.  Psychiatric:        Mood and Affect: Mood normal.      Results for orders placed or performed in visit on 07/17/24  POCT HgB A1C  Result Value Ref Range   Hemoglobin A1C 6.2 (A) 4.0 - 5.6 %   HbA1c POC (<> result, manual entry)     HbA1c, POC (prediabetic range)     HbA1c, POC (controlled diabetic range)        The 10-year ASCVD risk score (Arnett DK, et al., 2019) is: 2.9%    Assessment & Plan:   Problem List Items Addressed This Visit       Endocrine   Hypothyroidism    Hypothyroidism Currently taking levothyroxine  100 micrograms daily. - Order updated TSH lab.      Relevant Orders   TSH   Hyperlipidemia associated with type 2 diabetes mellitus (HCC) - Primary   Type 2 diabetes mellitus A1c is 6.2, indicating good control. - Continue current diabetes management plan. - due for urine micro      Relevant Medications   EPINEPHrine  0.3 mg/0.3 mL IJ SOAJ injection     Musculoskeletal and Integument   Rheumatoid arthritis (HCC)   Rheumatoid arthritis Increased pain and stiffness in hands with a new nodule. Awaiting rheumatology appointment. - Continue methotrexate  and Rinvoq. - Follow up with new rheumatologist next month or in December.  Other   MDD (major depressive disorder), recurrent episode, mild    Depression Cymbalta , trazodone , and bupropion  are effective with no concerns. - Continue Cymbalta , trazodone , and bupropion  as currently prescribed.  Flowsheet Row Office Visit from 07/17/2024 in [facility] at [facility]  PHQ-9 Total Score 4         Other Visit Diagnoses       Encounter for immunization       Relevant Orders   Flu vaccine trivalent PF, 6mos and older(Flulaval,Afluria,Fluarix,Fluzone) (Completed)      Assessment and Plan Assessment & Plan   Hazelnut allergy  Confirmed hazelnut allergy . Avoiding nuts with no episodes. Has EpiPen  and albuterol  inhaler for  emergencies. - Continue to avoid hazelnuts and other nuts as advised. - Use EpiPen  and albuterol  inhaler as needed for allergic reactions. - Maintain medical alert bracelet for safety.    Return for Diabetes follow-up.    Nancy Byars, MD

## 2024-07-17 NOTE — Assessment & Plan Note (Signed)
  Hypothyroidism Currently taking levothyroxine  100 micrograms daily. - Order updated TSH lab.

## 2024-07-17 NOTE — Patient Instructions (Signed)
 Please try to schedule your eye exam when you get a chance.

## 2024-07-17 NOTE — Assessment & Plan Note (Signed)
 Rheumatoid arthritis Increased pain and stiffness in hands with a new nodule. Awaiting rheumatology appointment. - Continue methotrexate  and Rinvoq. - Follow up with new rheumatologist next month or in December.

## 2024-07-18 ENCOUNTER — Ambulatory Visit: Payer: Self-pay | Admitting: Family Medicine

## 2024-07-18 LAB — SPECIMEN STATUS REPORT

## 2024-07-18 LAB — MICROALBUMIN / CREATININE URINE RATIO
Creatinine, Urine: 75.8 mg/dL
Microalb/Creat Ratio: 7 mg/g{creat} (ref 0–29)
Microalbumin, Urine: 5.2 ug/mL

## 2024-07-18 LAB — TSH: TSH: 0.846 u[IU]/mL (ref 0.450–4.500)

## 2024-07-18 NOTE — Progress Notes (Signed)
 Your lab work is within acceptable range and there are no concerning findings.   ?

## 2024-07-18 NOTE — Progress Notes (Signed)
 No excess protein in the urine which is great.  We will look out out for your eye exam coming up.

## 2024-07-20 ENCOUNTER — Other Ambulatory Visit: Payer: Self-pay | Admitting: Family Medicine

## 2024-07-20 DIAGNOSIS — G4700 Insomnia, unspecified: Secondary | ICD-10-CM

## 2024-08-31 ENCOUNTER — Other Ambulatory Visit: Payer: Self-pay | Admitting: Family Medicine

## 2024-08-31 DIAGNOSIS — E118 Type 2 diabetes mellitus with unspecified complications: Secondary | ICD-10-CM

## 2024-09-24 DIAGNOSIS — Z79899 Other long term (current) drug therapy: Secondary | ICD-10-CM | POA: Diagnosis not present

## 2024-09-24 DIAGNOSIS — M059 Rheumatoid arthritis with rheumatoid factor, unspecified: Secondary | ICD-10-CM | POA: Diagnosis not present

## 2024-10-18 ENCOUNTER — Ambulatory Visit: Admitting: Family Medicine

## 2024-10-18 ENCOUNTER — Encounter: Payer: Self-pay | Admitting: Family Medicine

## 2024-10-18 VITALS — BP 118/65 | HR 98 | Ht 66.0 in | Wt 222.0 lb

## 2024-10-18 DIAGNOSIS — E039 Hypothyroidism, unspecified: Secondary | ICD-10-CM | POA: Diagnosis not present

## 2024-10-18 DIAGNOSIS — G4700 Insomnia, unspecified: Secondary | ICD-10-CM | POA: Diagnosis not present

## 2024-10-18 DIAGNOSIS — E1169 Type 2 diabetes mellitus with other specified complication: Secondary | ICD-10-CM

## 2024-10-18 DIAGNOSIS — E785 Hyperlipidemia, unspecified: Secondary | ICD-10-CM

## 2024-10-18 DIAGNOSIS — M059 Rheumatoid arthritis with rheumatoid factor, unspecified: Secondary | ICD-10-CM | POA: Diagnosis not present

## 2024-10-18 DIAGNOSIS — E118 Type 2 diabetes mellitus with unspecified complications: Secondary | ICD-10-CM

## 2024-10-18 DIAGNOSIS — N760 Acute vaginitis: Secondary | ICD-10-CM

## 2024-10-18 DIAGNOSIS — Z7984 Long term (current) use of oral hypoglycemic drugs: Secondary | ICD-10-CM

## 2024-10-18 LAB — POCT GLYCOSYLATED HEMOGLOBIN (HGB A1C): Hemoglobin A1C: 6.7 % — AB (ref 4.0–5.6)

## 2024-10-18 MED ORDER — LEVOTHYROXINE SODIUM 100 MCG PO TABS: 100.0000 ug | ORAL_TABLET | Freq: Every day | ORAL | 1 refills | Status: AC

## 2024-10-18 MED ORDER — TRAZODONE HCL 150 MG PO TABS: ORAL_TABLET | ORAL | 1 refills | Status: AC

## 2024-10-18 NOTE — Assessment & Plan Note (Addendum)
 Followed by rheumatology.  Currently on methotrexate  and Rinvoq for treatment.  Unfortunately she was hospitalized back in July after syncopal episode and diagnosed with colitis and dehydration.

## 2024-10-18 NOTE — Assessment & Plan Note (Signed)
 Type 2 diabetes mellitus with hyperlipidemia A1c increased from 6.2 to 6.7. Discontinued semaglutide . On Farxiga . No significant exercise. Insurance covers current dose. Plan to monitor A1c for stability or improvement. Consider increasing Farxiga  if fasting blood sugars exceed 130 mg/dL. - Monitor A1c over the next 90 days. - Encouraged increased physical activity as weather permits. - Consider increasing Farxiga  dose if fasting blood sugars exceed 130 mg/dL. - Instructed to send MyChart message if adjustments needed before next appointment.

## 2024-10-18 NOTE — Progress Notes (Signed)
 "  Established Patient Office Visit  Patient ID: Nancy Wang, female    DOB: [birth year]  Age: 54 y.o. MRN: [id number] PCP: Alvan Dorothyann BIRCH, MD  Chief Complaint  Patient presents with   Diabetes   Hypertension    Subjective:     HPI  Discussed the use of AI scribe software for clinical note transcription with the patient, who gave verbal consent to proceed.  History of Present Illness Nancy Wang is a 54 year old female with diabetes who presents with vaginal itching and diabetes management.  Vaginal pruritus - Vaginal itching present - No significant change in vaginal discharge - No increased thickness or heaviness of discharge  Diabetes mellitus management - Discontinued semaglutide  approximately one month ago without side effects.  Said she just decided not to take it anymore - Continues to take Farxiga  - Last hemoglobin A1c was 6.7%, increased from 6.2% - Attributes A1c increase to dietary habits during the holidays - Limited exercise due to weather and early darkness - Occasional blood glucose monitoring - No fasting blood glucose readings above 130 mg/dL  Rheumatoid arthritis - Recently changed rheumatology providers due to previous provider's retirement - Met with new provider once - No changes to current treatment plan  Colitis and food allergies - History of colitis - Hazelnut allergy ; avoids nuts - Misses eating peanut butter and [employer] peanut butter cups - Prescribed EpiPen  and confirms it is up to date  Allergic rhinitis and asthma - Prescribed Airsupra for allergies - No recent need for Airsupra use  Gastroesophageal reflux disease - Continues to take omeprazole  (Prilosec)  Thyroid  dysfunction - Takes levothyroxine  100 mcg daily - History of fluctuating thyroid  levels - Agrees to recheck TSH levels today  Preventive care - Plans to schedule an eye exam soon     ROS    Objective:     BP 118/65   Pulse 98   Ht 5' 6  (1.676 m)   Wt 222 lb (100.7 kg)   SpO2 98%   BMI 35.83 kg/m    Physical Exam Vitals and nursing note reviewed.  Constitutional:      Appearance: Normal appearance.  HENT:     Head: Normocephalic and atraumatic.  Eyes:     Conjunctiva/sclera: Conjunctivae normal.  Cardiovascular:     Rate and Rhythm: Normal rate and regular rhythm.  Pulmonary:     Effort: Pulmonary effort is normal.     Breath sounds: Normal breath sounds.  Skin:    General: Skin is warm and dry.  Neurological:     Mental Status: She is alert.  Psychiatric:        Mood and Affect: Mood normal.      Results for orders placed or performed in visit on 10/18/24  POCT HgB A1C  Result Value Ref Range   Hemoglobin A1C 6.7 (A) 4.0 - 5.6 %   HbA1c POC (<> result, manual entry)     HbA1c, POC (prediabetic range)     HbA1c, POC (controlled diabetic range)        The 10-year ASCVD risk score (Arnett DK, et al., 2019) is: 3.2%    Assessment & Plan:   Problem List Items Addressed This Visit       Endocrine   Hypothyroidism   Hypothyroidism Previous thyroid  level fluctuations. Currently on 100 microgram dose. - Rechecked TSH levels today.      Relevant Medications   levothyroxine  (SYNTHROID ) 100 MCG tablet   Other Relevant Orders   TSH  Hyperlipidemia associated with type 2 diabetes mellitus (HCC) - Primary   Type 2 diabetes mellitus with hyperlipidemia A1c increased from 6.2 to 6.7. Discontinued semaglutide . On Farxiga . No significant exercise. Insurance covers current dose. Plan to monitor A1c for stability or improvement. Consider increasing Farxiga  if fasting blood sugars exceed 130 mg/dL. - Monitor A1c over the next 90 days. - Encouraged increased physical activity as weather permits. - Consider increasing Farxiga  dose if fasting blood sugars exceed 130 mg/dL. - Instructed to send MyChart message if adjustments needed before next appointment.      Relevant Orders   POCT HgB A1C (Completed)    CMP14+EGFR   Lipid panel   CBC     Musculoskeletal and Integument   Seropositive rheumatoid arthritis (HCC)   Followed by rheumatology.  Currently on methotrexate  and Rinvoq for treatment.  Unfortunately she was hospitalized back in July after syncopal episode and diagnosed with colitis and dehydration.      Relevant Orders   CMP14+EGFR   Lipid panel   CBC     Other   Insomnia   Doing well with trazodone . Uses it nigthly.  Due for refills.       Relevant Medications   traZODone  (DESYREL ) 150 MG tablet   Other Visit Diagnoses       Acute vaginitis       Relevant Orders   WET PREP FOR TRICH, YEAST, CLUE       Assessment and Plan Assessment & Plan Acute vaginitis Mild vaginal itching. Differential includes yeast infection and bacterial vaginosis. - Performed self-swab and wet prep for yeast or BV. - Send results for analysis and follow up with treatment if necessary.       Return in about 3 months (around 01/23/2025) for Diabetes follow-up.    Dorothyann Byars, MD [facility] at [facility]   "

## 2024-10-18 NOTE — Assessment & Plan Note (Signed)
 Hypothyroidism Previous thyroid  level fluctuations. Currently on 100 microgram dose. - Rechecked TSH levels today.

## 2024-10-18 NOTE — Assessment & Plan Note (Signed)
 Doing well with trazodone . Uses it nigthly.  Due for refills.

## 2024-10-19 ENCOUNTER — Ambulatory Visit: Payer: Self-pay | Admitting: Family Medicine

## 2024-10-19 LAB — CBC
Hematocrit: 47.3 % — ABNORMAL HIGH (ref 34.0–46.6)
Hemoglobin: 15.2 g/dL (ref 11.1–15.9)
MCH: 31.5 pg (ref 26.6–33.0)
MCHC: 32.1 g/dL (ref 31.5–35.7)
MCV: 98 fL — ABNORMAL HIGH (ref 79–97)
Platelets: 361 x10E3/uL (ref 150–450)
RBC: 4.82 x10E6/uL (ref 3.77–5.28)
RDW: 12.4 % (ref 11.7–15.4)
WBC: 8.9 x10E3/uL (ref 3.4–10.8)

## 2024-10-19 LAB — WET PREP FOR TRICH, YEAST, CLUE
Clue Cell Exam: NEGATIVE
Trichomonas Exam: NEGATIVE
Yeast Exam: NEGATIVE

## 2024-10-19 LAB — CMP14+EGFR
ALT: 31 IU/L (ref 0–32)
AST: 22 IU/L (ref 0–40)
Albumin: 4.2 g/dL (ref 3.8–4.9)
Alkaline Phosphatase: 123 IU/L (ref 49–135)
BUN/Creatinine Ratio: 12 (ref 9–23)
BUN: 12 mg/dL (ref 6–24)
Bilirubin Total: 1.1 mg/dL (ref 0.0–1.2)
CO2: 23 mmol/L (ref 20–29)
Calcium: 9.4 mg/dL (ref 8.7–10.2)
Chloride: 98 mmol/L (ref 96–106)
Creatinine, Ser: 1.01 mg/dL — ABNORMAL HIGH (ref 0.57–1.00)
Globulin, Total: 3.1 g/dL (ref 1.5–4.5)
Glucose: 171 mg/dL — ABNORMAL HIGH (ref 70–99)
Potassium: 4.2 mmol/L (ref 3.5–5.2)
Sodium: 137 mmol/L (ref 134–144)
Total Protein: 7.3 g/dL (ref 6.0–8.5)
eGFR: 67 mL/min/1.73

## 2024-10-19 LAB — LIPID PANEL
Chol/HDL Ratio: 4.1 ratio (ref 0.0–4.4)
Cholesterol, Total: 224 mg/dL — ABNORMAL HIGH (ref 100–199)
HDL: 55 mg/dL
LDL Chol Calc (NIH): 144 mg/dL — ABNORMAL HIGH (ref 0–99)
Triglycerides: 141 mg/dL (ref 0–149)
VLDL Cholesterol Cal: 25 mg/dL (ref 5–40)

## 2024-10-19 LAB — TSH: TSH: 1.71 u[IU]/mL (ref 0.450–4.500)

## 2024-10-19 NOTE — Progress Notes (Signed)
 Hi Nancy Wang your kidney function is stable. Your cholesterol is high, encourage you to continue to work on healthy Mediterranean diet and shooting for exercise for 30 minutes 5 days/week.  Blood count is okay no sign of anemia.  Thyroid  looks great at 1.7.  And vaginal swab is negative for any type of yeast or overgrowth of bacteria.  Hopefully you are feeling better in that regard but if not please let us  know.  The 10-year ASCVD risk score (Arnett DK, et al., 2019) is: 3.1%   Values used to calculate the score:     Age: 55 years     Clinically relevant sex: Female     Is Non-Hispanic African American: No     Diabetic: Yes     Tobacco smoker: No     Systolic Blood Pressure: 118 mmHg     Is BP treated: No     HDL Cholesterol: 55 mg/dL     Total Cholesterol: 224 mg/dL

## 2025-01-23 ENCOUNTER — Ambulatory Visit: Admitting: Family Medicine
